# Patient Record
Sex: Female | Born: 1950 | Race: White | Hispanic: No | Marital: Married | Smoking: Never smoker
Health system: Southern US, Community
[De-identification: ages and names within clinical notes are randomized; demographics above are authoritative.]

## PROBLEM LIST (undated history)

## (undated) DIAGNOSIS — E785 Hyperlipidemia, unspecified: Secondary | ICD-10-CM

## (undated) DIAGNOSIS — B351 Tinea unguium: Secondary | ICD-10-CM

## (undated) DIAGNOSIS — I679 Cerebrovascular disease, unspecified: Secondary | ICD-10-CM

## (undated) DIAGNOSIS — G43909 Migraine, unspecified, not intractable, without status migrainosus: Secondary | ICD-10-CM

## (undated) DIAGNOSIS — E669 Obesity, unspecified: Secondary | ICD-10-CM

## (undated) DIAGNOSIS — M1990 Unspecified osteoarthritis, unspecified site: Secondary | ICD-10-CM

## (undated) DIAGNOSIS — E559 Vitamin D deficiency, unspecified: Secondary | ICD-10-CM

## (undated) DIAGNOSIS — I10 Essential (primary) hypertension: Secondary | ICD-10-CM

## (undated) DIAGNOSIS — R519 Headache, unspecified: Secondary | ICD-10-CM

## (undated) DIAGNOSIS — I639 Cerebral infarction, unspecified: Secondary | ICD-10-CM

## (undated) DIAGNOSIS — M8580 Other specified disorders of bone density and structure, unspecified site: Secondary | ICD-10-CM

## (undated) DIAGNOSIS — R490 Dysphonia: Secondary | ICD-10-CM

## (undated) DIAGNOSIS — M5430 Sciatica, unspecified side: Secondary | ICD-10-CM

## (undated) DIAGNOSIS — K589 Irritable bowel syndrome without diarrhea: Secondary | ICD-10-CM

## (undated) DIAGNOSIS — R112 Nausea with vomiting, unspecified: Secondary | ICD-10-CM

## (undated) DIAGNOSIS — I700 Atherosclerosis of aorta: Secondary | ICD-10-CM

## (undated) DIAGNOSIS — R32 Unspecified urinary incontinence: Secondary | ICD-10-CM

## (undated) DIAGNOSIS — M79603 Pain in arm, unspecified: Secondary | ICD-10-CM

## (undated) DIAGNOSIS — J302 Other seasonal allergic rhinitis: Secondary | ICD-10-CM

## (undated) DIAGNOSIS — R1310 Dysphagia, unspecified: Secondary | ICD-10-CM

## (undated) DIAGNOSIS — Z9889 Other specified postprocedural states: Secondary | ICD-10-CM

## (undated) DIAGNOSIS — K219 Gastro-esophageal reflux disease without esophagitis: Secondary | ICD-10-CM

## (undated) DIAGNOSIS — Z973 Presence of spectacles and contact lenses: Secondary | ICD-10-CM

## (undated) DIAGNOSIS — M5481 Occipital neuralgia: Secondary | ICD-10-CM

## (undated) DIAGNOSIS — G459 Transient cerebral ischemic attack, unspecified: Secondary | ICD-10-CM

## (undated) DIAGNOSIS — N898 Other specified noninflammatory disorders of vagina: Secondary | ICD-10-CM

## (undated) DIAGNOSIS — F430 Acute stress reaction: Secondary | ICD-10-CM

## (undated) DIAGNOSIS — R51 Headache: Secondary | ICD-10-CM

## (undated) DIAGNOSIS — K5792 Diverticulitis of intestine, part unspecified, without perforation or abscess without bleeding: Secondary | ICD-10-CM

## (undated) HISTORY — DX: Vitamin D deficiency, unspecified: E55.9

## (undated) HISTORY — DX: Headache, unspecified: R51.9

## (undated) HISTORY — PX: TONSILLECTOMY: SUR1361

## (undated) HISTORY — DX: Dysphonia: R49.0

## (undated) HISTORY — DX: Atherosclerosis of aorta: I70.0

## (undated) HISTORY — DX: Diverticulitis of intestine, part unspecified, without perforation or abscess without bleeding: K57.92

## (undated) HISTORY — PX: OTHER SURGICAL HISTORY: SHX169

## (undated) HISTORY — DX: Acute stress reaction: F43.0

## (undated) HISTORY — DX: Unspecified urinary incontinence: R32

## (undated) HISTORY — DX: Other specified disorders of bone density and structure, unspecified site: M85.80

## (undated) HISTORY — DX: Sciatica, unspecified side: M54.30

## (undated) HISTORY — DX: Other specified noninflammatory disorders of vagina: N89.8

## (undated) HISTORY — DX: Pain in arm, unspecified: M79.603

## (undated) HISTORY — PX: DIAGNOSTIC LAPAROSCOPY: SUR761

## (undated) HISTORY — DX: Migraine, unspecified, not intractable, without status migrainosus: G43.909

## (undated) HISTORY — DX: Tinea unguium: B35.1

## (undated) HISTORY — DX: Dysphagia, unspecified: R13.10

## (undated) HISTORY — PX: CHOLECYSTECTOMY: SHX55

## (undated) HISTORY — PX: COLONOSCOPY: SHX174

## (undated) HISTORY — DX: Cerebrovascular disease, unspecified: I67.9

## (undated) HISTORY — DX: Irritable bowel syndrome, unspecified: K58.9

## (undated) HISTORY — PX: DILATION AND CURETTAGE OF UTERUS: SHX78

## (undated) HISTORY — DX: Obesity, unspecified: E66.9

## (undated) HISTORY — PX: ABDOMINAL HYSTERECTOMY: SHX81

## (undated) HISTORY — PX: LARYNGOSCOPY: SUR817

## (undated) HISTORY — DX: Occipital neuralgia: M54.81

## (undated) HISTORY — DX: Headache: R51

## (undated) HISTORY — DX: Hyperlipidemia, unspecified: E78.5

---

## 1998-07-03 ENCOUNTER — Ambulatory Visit (HOSPITAL_COMMUNITY): Admission: RE | Admit: 1998-07-03 | Discharge: 1998-07-03 | Payer: Self-pay | Admitting: Orthopedic Surgery

## 1999-03-25 ENCOUNTER — Ambulatory Visit (HOSPITAL_COMMUNITY): Admission: RE | Admit: 1999-03-25 | Discharge: 1999-03-26 | Payer: Self-pay | Admitting: *Deleted

## 2000-06-24 ENCOUNTER — Encounter: Admission: RE | Admit: 2000-06-24 | Discharge: 2000-09-22 | Payer: Self-pay | Admitting: Anesthesiology

## 2000-08-02 ENCOUNTER — Emergency Department (HOSPITAL_COMMUNITY): Admission: EM | Admit: 2000-08-02 | Discharge: 2000-08-02 | Payer: Self-pay | Admitting: Emergency Medicine

## 2004-07-29 ENCOUNTER — Encounter: Admission: RE | Admit: 2004-07-29 | Discharge: 2004-09-01 | Payer: Self-pay | Admitting: Family Medicine

## 2005-07-02 ENCOUNTER — Other Ambulatory Visit: Admission: RE | Admit: 2005-07-02 | Discharge: 2005-07-02 | Payer: Self-pay | Admitting: Obstetrics and Gynecology

## 2006-12-03 ENCOUNTER — Encounter: Admission: RE | Admit: 2006-12-03 | Discharge: 2006-12-03 | Payer: Self-pay | Admitting: Family Medicine

## 2007-12-22 ENCOUNTER — Encounter: Admission: RE | Admit: 2007-12-22 | Discharge: 2007-12-22 | Payer: Self-pay | Admitting: Family Medicine

## 2007-12-27 ENCOUNTER — Encounter: Admission: RE | Admit: 2007-12-27 | Discharge: 2007-12-27 | Payer: Self-pay | Admitting: Family Medicine

## 2009-01-16 ENCOUNTER — Encounter: Admission: RE | Admit: 2009-01-16 | Discharge: 2009-01-16 | Payer: Self-pay | Admitting: Family Medicine

## 2009-09-24 ENCOUNTER — Encounter: Admission: RE | Admit: 2009-09-24 | Discharge: 2009-09-24 | Payer: Self-pay | Admitting: Obstetrics and Gynecology

## 2010-12-14 DIAGNOSIS — G459 Transient cerebral ischemic attack, unspecified: Secondary | ICD-10-CM

## 2010-12-14 HISTORY — DX: Transient cerebral ischemic attack, unspecified: G45.9

## 2011-01-04 ENCOUNTER — Encounter: Payer: Self-pay | Admitting: Family Medicine
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
## 2011-07-24 ENCOUNTER — Emergency Department (HOSPITAL_COMMUNITY): Payer: 59

## 2011-07-24 ENCOUNTER — Observation Stay (HOSPITAL_COMMUNITY)
Admission: EM | Admit: 2011-07-24 | Discharge: 2011-07-25 | DRG: 065 | Disposition: A | Discharge status: Home / Self Care | Payer: 59 | Attending: Internal Medicine | Admitting: Internal Medicine

## 2011-07-24 DIAGNOSIS — K219 Gastro-esophageal reflux disease without esophagitis: Secondary | ICD-10-CM | POA: Diagnosis present

## 2011-07-24 DIAGNOSIS — E669 Obesity, unspecified: Secondary | ICD-10-CM | POA: Diagnosis present

## 2011-07-24 DIAGNOSIS — M531 Cervicobrachial syndrome: Secondary | ICD-10-CM | POA: Insufficient documentation

## 2011-07-24 DIAGNOSIS — K5730 Diverticulosis of large intestine without perforation or abscess without bleeding: Secondary | ICD-10-CM | POA: Insufficient documentation

## 2011-07-24 DIAGNOSIS — R209 Unspecified disturbances of skin sensation: Principal | ICD-10-CM | POA: Insufficient documentation

## 2011-07-24 DIAGNOSIS — Z79899 Other long term (current) drug therapy: Secondary | ICD-10-CM | POA: Insufficient documentation

## 2011-07-24 DIAGNOSIS — I672 Cerebral atherosclerosis: Secondary | ICD-10-CM | POA: Diagnosis present

## 2011-07-24 DIAGNOSIS — I10 Essential (primary) hypertension: Secondary | ICD-10-CM | POA: Diagnosis present

## 2011-07-24 DIAGNOSIS — E785 Hyperlipidemia, unspecified: Secondary | ICD-10-CM | POA: Diagnosis present

## 2011-07-24 LAB — DIFFERENTIAL
Eosinophils Absolute: 0.1 10*3/uL (ref 0.0–0.7)
Lymphs Abs: 2.6 10*3/uL (ref 0.7–4.0)
Monocytes Relative: 4 % (ref 3–12)
Neutrophils Relative %: 60 % (ref 43–77)

## 2011-07-24 LAB — POCT I-STAT, CHEM 8
BUN: 18 mg/dL (ref 6–23)
Calcium, Ion: 1.22 mmol/L (ref 1.12–1.32)
Chloride: 110 mEq/L (ref 96–112)
Creatinine, Ser: 0.9 mg/dL (ref 0.50–1.10)
Glucose, Bld: 88 mg/dL (ref 70–99)

## 2011-07-24 LAB — CBC
MCH: 31.4 pg (ref 26.0–34.0)
MCV: 88.3 fL (ref 78.0–100.0)
Platelets: 200 10*3/uL (ref 150–400)
RBC: 4.43 MIL/uL (ref 3.87–5.11)

## 2011-07-25 ENCOUNTER — Inpatient Hospital Stay (HOSPITAL_COMMUNITY): Payer: 59

## 2011-07-25 LAB — URINALYSIS, ROUTINE W REFLEX MICROSCOPIC
Nitrite: NEGATIVE
Protein, ur: NEGATIVE mg/dL
Urobilinogen, UA: 0.2 mg/dL (ref 0.0–1.0)

## 2011-07-25 LAB — COMPREHENSIVE METABOLIC PANEL
ALT: 17 U/L (ref 0–35)
CO2: 24 mEq/L (ref 19–32)
Calcium: 9 mg/dL (ref 8.4–10.5)
Creatinine, Ser: 0.83 mg/dL (ref 0.50–1.10)
GFR calc Af Amer: >60 mL/min (ref >60)
GFR calc non Af Amer: >60 mL/min (ref >60)
Glucose, Bld: 93 mg/dL (ref 70–99)
Total Bilirubin: 0.6 mg/dL (ref 0.3–1.2)

## 2011-07-25 LAB — CBC
MCH: 31 pg (ref 26.0–34.0)
MCV: 89.4 fL (ref 78.0–100.0)
Platelets: 190 10*3/uL (ref 150–400)
RDW: 13 % (ref 11.5–15.5)

## 2011-07-25 LAB — HEMOGLOBIN A1C: Hgb A1c MFr Bld: 6 % — ABNORMAL HIGH (ref <5.7)

## 2011-07-25 LAB — LIPID PANEL
HDL: 34 mg/dL — ABNORMAL LOW (ref >39)
Triglycerides: 211 mg/dL — ABNORMAL HIGH (ref <150)

## 2011-07-25 LAB — CARDIAC PANEL(CRET KIN+CKTOT+MB+TROPI)
Relative Index: 2.4 (ref 0.0–2.5)
Total CK: 126 U/L (ref 7–177)
Troponin I: <0.3 ng/mL (ref <0.30)

## 2011-07-25 LAB — URINE MICROSCOPIC-ADD ON

## 2011-07-25 LAB — RAPID URINE DRUG SCREEN, HOSP PERFORMED
Amphetamines: NOT DETECTED
Tetrahydrocannabinol: NOT DETECTED

## 2011-07-25 MED ORDER — IOHEXOL 350 MG/ML SOLN
50.0000 mL | Freq: Once | INTRAVENOUS | Status: AC | PRN
  Administered 2011-07-25: 50 mL via INTRAVENOUS

## 2011-07-28 NOTE — Discharge Summary (Signed)
**Alexis Frye De-Identified via Obfuscation**   NAMESHAQUINA, Alexis Frye               ACCOUNT NO.:  [id number]  MEDICAL RECORD NO.:  [id number]  LOCATION:  3008                         FACILITY:  [facility]  PHYSICIAN:  Lonia Blood, M.D.       DATE OF BIRTH:  October 06, 1951  DATE OF ADMISSION:  07/24/2011 DATE OF DISCHARGE:  07/25/2011                              DISCHARGE SUMMARY   PRIMARY CARE PHYSICIAN:  Dr. Daisy Floro at [facility].  DISCHARGE DIAGNOSES: 1. Right-sided body numbness, question lacunar stroke versus transient     ischemic attack. 2. 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 3. Hypertension. 4. Hyperlipidemia. 5. Status post cholecystectomy. 6. Status post hysterectomy. 7. History of occipital neuralgia. 8. History of sciatica.  DISCHARGE MEDICATIONS: 1. Aspirin 325 mg daily. 2. Cymbalta 60 mg daily. 3. Lipitor 20 mg daily. 4. Multivitamin a tablet daily. 5. Prevacid 15 mg daily. 6. Topamax 25 mg 3 tablets daily.  CONDITION ON DISCHARGE:  Alexis Alexis Frye Alexis Frye was discharged in good condition, neurologically intact.  Temperature 98.4 heart rate 72, respirations 16, blood pressure 136/83, saturation 94% on room air.  She will follow up with Dr. Delia Heady from Neurology.  She will also follow up with her primary care physician as needed.  PROCEDURE THIS ADMISSION: 1. Alexis Alexis Frye Alexis Frye underwent CT angiogram of Alexis Alexis Frye neck which was negative     for carotid stenosis. 2. Carotid ultrasound negative for carotid stenosis. 3. CT angiogram of Alexis Alexis Frye head with findings of mild intracranial     atherosclerosis with moderate stenosis of Alexis Alexis Frye left ACA, A4 segment,     preserved distal flow.  No signs of acute stroke.  CONSULTATION THIS ADMISSION:  Alexis Alexis Frye Alexis Frye was seen in consultation by Dr. Pearlean Brownie from Neurology.  HOSPITAL COURSE:  Alexis Alexis Frye Alexis Frye is a 60 year old woman with history of hypertension and hyperlipidemia presented to emergency room with sudden onset of right-sided hemianesthesia of Alexis Alexis Frye body.   She was placed in Alexis Alexis Frye hospital for observation for 24 hours.  She had frequent neurological checks without any problems being found.  She had improvement in her symptoms but decreased paresthesia on Alexis Alexis Frye right side of Alexis Alexis Frye body.  We hypothesized that her symptoms were probably due to small lacunar stroke in Alexis Alexis Frye left thalamic region.  Also other possibilities are TIA like problem.  In any case, she improved nicely during this admission. A CT angiogram of Alexis Alexis Frye head and neck did not reveal any major intracranial stenosis.  MRI of Alexis Alexis Frye brain could not be done due to implantable pain stimulator.  Alexis Alexis Frye Alexis Frye was advised to start taking aspirin on a daily basis and she will follow up with Dr. Pearlean Brownie as an outpatient.     Lonia Blood, M.D.     SL/MEDQ  D:  07/25/2011  T:  07/25/2011  Job:  [id number]  cc:   Magnus Sinning) Tenny Craw, M.D. Pramod P. Pearlean Brownie, MD  Electronically Signed by Lonia Blood M.D. on 07/28/2011 05:46:20 PM

## 2011-08-12 NOTE — Consult Note (Signed)
NAMESINTIA, MCKISSIC               ACCOUNT NO.:  [id number]  MEDICAL RECORD NO.:  [id number]  LOCATION:  3008                         FACILITY:  [facility]  PHYSICIAN:  Felise Georgia P. Pearlean Brownie, MD    DATE OF BIRTH:  [birth year]  DATE OF CONSULTATION: DATE OF DISCHARGE:                                CONSULTATION   REFERRING PHYSICIAN:  Lorre Nick, MD  REASON FOR REFERRAL:  Stroke.  HISTORY OF PRESENT ILLNESS:  Ms. Mayol is a 60 year old Caucasian lady who woke up this morning with right-sided numbness.  She describes this affecting the right half of the face as well as arm and leg.  She describes this as a tingling and extra sensation rather than the lack of sensation.  She had some trouble holding a coffee cup and she felt that she did not feel any weight when she moved it, she did not actually drop with.  She denies any weakness, loss of vision, double vision, vertigo. She did notice that her balance was slightly off and she took one or two-step off but was able to steady herself and did not fall.  She denied any prior history of stroke, TIA, seizures.  She does have history of chronic occipital neuralgia for which she has a pain stimulator implant and since she sees pain doctor at [facility].  She also has mild chronic headaches which has seem to be well controlled with Topamax.  PAST MEDICAL HISTORY:  Significant for hypertension, hyperlipidemia, diverticulosis, headaches, occipital neuralgia, and sciatica.  HOME MEDICATIONS:  Cymbalta, fish oil, Lipitor, Lotemax, multivitamin, Prevacid, Topamax, Tylenol.  MEDICATION ALLERGIES:  None listed.  PAST SURGICAL HISTORY:  Occipital neuralgia pain stimulator implant.  REVIEW OF SYSTEMS:  Negative for recent fever, cough, chest pain, diarrhea, or illness.  PHYSICAL EXAMINATION:  GENERAL:  Reveals pleasant middle-aged Caucasian lady. VITAL SIGNS:  She is febrile.  Pulse rate is 72 per minute and regular, respiratory rate 16 per minute, oxygen  sats 99%, blood pressure 146/79. Distal pulses are well felt. HEAD:  Nontraumatic. NECK:  Supple.  There is no bruit. EARS:  Hearing is normal. CARDIAC:  No murmur or gallop. LUNGS:  Clear to auscultation. ABDOMEN:  Soft, nontender. NEUROLOGIC:  She is awake, alert, oriented to time, place, and person. Speech and language appear normal.  She appears slightly anxious.  Eye movements are full range without nystagmus.  Visual acuity and field seem adequate.  Face is symmetric.  Palatal movements are normal. Tongue is midline. MOTOR SYSTEM:  Reveals no upper or lower extremity drift, symmetric, and equal strength in all four extremities.  No focal weakness.  She has no objective sensory loss to touch or pinprick, but has subjective paresthesias and tingling sensation on the right hemibody including the forehead.  Gait was not tested.  DATA REVIEWED:  Noncontrast CAT scan of the head shows no acute abnormality.  Basic metabolic panel labs and CBC are normal.  IMPRESSION:  A 59 year old lady with sudden onset of right hemibody paresthesias this morning, possibly from small left thalamic lacunar infarct.  PLAN:  She will be admitted for further stroke workup.  MRI cannot be done because of pain stimulator implant.  Check  CT angiogram of the brain and neck in the morning.  Check echocardiogram, Dopplers, lipid profile, hemoglobin A1c.  Start aspirin 325 mg a day.  I had long discussion with the patient's husband and daughter regarding her symptoms, plan for evaluation treatment, and answered questions.  Stroke Team will follow.     Keean Wilmeth P. Pearlean Brownie, MD     PPS/MEDQ  D:  07/24/2011  T:  07/25/2011  Job:  [id number]  Electronically Signed by Delia Heady MD on 08/12/2011 08:33:00 PM

## 2011-08-13 ENCOUNTER — Ambulatory Visit: Payer: 59 | Attending: Family Medicine | Admitting: Physical Therapy

## 2011-08-13 DIAGNOSIS — M79609 Pain in unspecified limb: Secondary | ICD-10-CM | POA: Insufficient documentation

## 2011-08-13 DIAGNOSIS — IMO0001 Reserved for inherently not codable concepts without codable children: Secondary | ICD-10-CM | POA: Insufficient documentation

## 2011-08-13 DIAGNOSIS — M2569 Stiffness of other specified joint, not elsewhere classified: Secondary | ICD-10-CM | POA: Insufficient documentation

## 2011-08-20 ENCOUNTER — Ambulatory Visit: Payer: 59 | Attending: Family Medicine | Admitting: Physical Therapy

## 2011-08-20 DIAGNOSIS — M2569 Stiffness of other specified joint, not elsewhere classified: Secondary | ICD-10-CM | POA: Insufficient documentation

## 2011-08-20 DIAGNOSIS — M79609 Pain in unspecified limb: Secondary | ICD-10-CM | POA: Insufficient documentation

## 2011-08-20 DIAGNOSIS — IMO0001 Reserved for inherently not codable concepts without codable children: Secondary | ICD-10-CM | POA: Insufficient documentation

## 2011-08-25 ENCOUNTER — Encounter: Payer: 59 | Admitting: Physical Therapy

## 2011-08-27 ENCOUNTER — Ambulatory Visit: Payer: 59 | Admitting: Physical Therapy

## 2011-09-01 ENCOUNTER — Ambulatory Visit: Payer: 59 | Admitting: Physical Therapy

## 2011-09-03 ENCOUNTER — Ambulatory Visit: Payer: 59 | Admitting: Physical Therapy

## 2011-09-26 NOTE — H&P (Signed)
Alexis Frye, Alexis Frye               ACCOUNT NO.:  [id number]  MEDICAL RECORD NO.:  [id number]  LOCATION:  3008                         FACILITY:  [facility]  PHYSICIAN:  Lonia Blood, M.D.      DATE OF BIRTH:  [birth year]  DATE OF ADMISSION:  07/24/2011 DATE OF DISCHARGE:                             HISTORY & PHYSICAL   PRIMARY CARE PHYSICIAN:  Media planner at [facility].  PRESENTING COMPLAINT:  Right-sided numbness, tingling, and weakness.  HISTORY OF PRESENT ILLNESS:  The patient is a 60 year old female with history of hypertension, hyperlipidemia, and migraine headaches who presented with numbness and tingling on her right side.  This happened today, she woke up in the middle of the night with the symptoms.  This persisted for a few minutes.  It is already gone by the time she go to the ED.  She has some tingling also.  There was problem with balance mildly initially.  She denied any loss of consciousness, no diplopia, no vertigo, no focal weakness.  She had no problem with acute headaches, no nausea, no neck pain, no vomiting.  Symptoms are currently gone by the time she got to the ED.  Her past medical history is significant for persistent headache, history of occipital neuralgia, status post pain stimulator insertion, hyperlipidemia, irregular heart rate, history of sciatica, hypertension.  PAST SURGICAL HISTORY:  Status post cholecystectomy, status post hysterectomy, status post laparoscopy, status post tonsillectomy, status for laryngectomy, start post insertion of neurostimulator x3.  ALLERGIES:  No known drug allergies.  MEDICATIONS:  Currently on, Cymbalta, fish oil, Lipitor, Lotemax ophthalmic drug, multivitamins, Prevacid, Topamax, and Tylenol.  SOCIAL HISTORY:  She is married, lives with her husband.  Denied tobacco, alcohol, or IV drug use.  FAMILY HISTORY:  Denied any family history of thromboembolic phenomena or CVA.  REVIEW OF SYSTEMS:  All  systems reviewed and are negative except per HPI.  PHYSICAL EXAMINATION:  VITAL SIGNS:  Temperature 97.7, blood pressure 138/80 with a pulse 69, respiratory rate 16, O2 sats 95% on room air. GENERAL:  She is awake, alert, and oriented in no acute distress. HEENT:  PERRL.  EOMI.  No pallor, no jaundice, no rhinorrhea. NECK:  Supple.  No visible JVD.  No lymphadenopathy. RESPIRATORY:  Shows good air entry bilaterally.  No wheezes, no rales, no crackles. CARDIOVASCULAR SYSTEM:  She has S1, S2.  No murmur. ABDOMEN:  Soft, full, nontender with positive bowel sounds. EXTREMITIES:  No edema, cyanosis, or clubbing. SKIN:  No rashes.  No ulcers.  Her labs, urine drug screen is negative.  White count is 7.7, hemoglobin 13.9 with platelet of 200.  Sodium 142, potassium 3.7, chloride 110, glucose 88, BUN 18, creatinine 0.90.  Head CT without contrast showed no acute abnormality.  There is some left sphenoid sinus disease.  EKG showed normal sinus rhythm with no significant ST-T wave changes.  ASSESSMENT:  This is a 60 year old female presenting with right-sided transient ischemic attack, hemibody tingling and numbness.  These have resolved now, but patient has risk factors for cerebrovascular accident. She is hence being admitted for workup of transient ischemic attack, cerebrovascular accident.  The patient has a neurostimulator in  place, so she cannot get an MRI.  PLAN: 1. Transient ischemic attack.  Admit the patient to neuro floor.  She     has already passed swallow evaluation.  Her symptoms have resolved     and actually came in outside the windows for t-PA anyways.  We will     get CT angiogram in the morning, get carotid Dopplers, 2-D echo.     If any of those is positive, we will do further workup including     thiamine, B12 levels.  Neurology is already involved and Dr. Pearlean Brownie     will follow up with the patient.  She will be on aspirin for now     and further treatment we will  follow especially after her CT     angiogram. 2. Hyperlipidemia.  We will continue with Lipitor and fish oil. 3. Hypertension.  Blood pressure is reasonable at this point.     Continue with home medications. 4. GERD.  I will put on PPI. 5. Obesity.  The patient seems stable and may need good nutritional     consult.  Further treatment will depend on patient's response to     these initial measures.     Lonia Blood, M.D.     Verlin Grills  D:  07/25/2011  T:  07/25/2011  Job:  [id number]  Electronically Signed by Lonia Blood M.D. on 09/26/2011 02:51:00 PM

## 2013-07-24 ENCOUNTER — Other Ambulatory Visit: Payer: Self-pay | Admitting: Neurosurgery

## 2013-08-29 NOTE — Pre-Procedure Instructions (Signed)
Alexis Frye  08/29/2013   Your procedure is scheduled on: Thursday, September 25th.  Report to [facility], Main Entrance [name] "A"at 5:30AM.  Call this number if you have problems the morning of surgery: [phone]   Remember:   Do not eat food or drink liquids after midnight.   Take these medicines the morning of surgery with A SIP OF WATER: -   Do not wear jewelry, make-up or nail polish.  Do not wear lotions, powders, or perfumes. You may wear deodorant.  Do not shave 48 hours prior to surgery.   Do not bring valuables to the hospital.  [facility] is not responsible  for any belongings or valuables.  Contacts, dentures or bridgework may not be worn into surgery.  Leave suitcase in the car. After surgery it may be brought to your room.  For patients admitted to the hospital, checkout time is 11:00 AM the day of discharge.   Patients discharged the day of surgery will not be allowed to drive home.  Name and phone number of your driver: -   Special Instructions:    Please read over the following fact sheets that you were given: Pain Booklet, Coughing and Deep Breathing and Surgical Site Infection Prevention

## 2013-08-30 ENCOUNTER — Other Ambulatory Visit (HOSPITAL_COMMUNITY): Payer: 59

## 2013-09-07 ENCOUNTER — Encounter (HOSPITAL_COMMUNITY): Admission: RE | Payer: Self-pay | Source: Ambulatory Visit

## 2013-09-07 ENCOUNTER — Ambulatory Visit (HOSPITAL_COMMUNITY): Admission: RE | Admit: 2013-09-07 | Payer: 59 | Source: Ambulatory Visit | Admitting: Neurosurgery

## 2013-09-07 SURGERY — SUBTHALAMIC STIMULATOR BATTERY REPLACEMENT
Anesthesia: General

## 2014-11-01 ENCOUNTER — Other Ambulatory Visit: Payer: Self-pay | Admitting: Orthopedic Surgery

## 2014-11-21 ENCOUNTER — Encounter (HOSPITAL_BASED_OUTPATIENT_CLINIC_OR_DEPARTMENT_OTHER): Payer: Self-pay | Admitting: *Deleted

## 2014-11-21 NOTE — Progress Notes (Signed)
No labs needed

## 2014-11-22 NOTE — H&P (Signed)
Alexis HollerVanessa J Frye is an 63 y.o. female.   Chief Complaint: Left Shoulder Pain  HPI: Patient presents with a chief complaint of left shoulder pain.  Patient states that she's noticed increased pain over the last 2 months.  Over the last 3 weeks she's noticed increasing pain.  She recalls no injury and states her symptoms have gradually become worse.  She has noticed decreased range of motion over this time.  Today, she states the pain is constant and aching.  She does have some sharp pains that are moderate in severity and intermittent.  Today, she denies any numbness or tingling.  She denies any swelling.  Her symptoms are getting worse and wake her from sleep.  It is worse with activity and work and better with Motrin.  She denies any prior history of injury.  She denies any current fevers chills night sweats or other signs of infection.  Patient reports continued pain in her left shoulder, especially with overhead activities, it wakes her at night, and does interfere with chores.  An MRI scan has been accomplished showing rotator cuff tendinitis of the supraspinatus and infraspinatus with a fissure in the supraspinatus that is not full-thickness.  In addition reading the MRI scan it does look like she has subacromial spur at the outlet that is pinching the rotator cuff.  Past Medical History  Diagnosis Date  . GERD (gastroesophageal reflux disease)   . Hyperlipemia   . Seasonal allergies   . Wears glasses   . Arthritis   . TIA (transient ischemic attack) 2012    no residual  . PONV (postoperative nausea and vomiting)     Past Surgical History  Procedure Laterality Date  . Abdominal hysterectomy    . Dilation and curettage of uterus    . Cesarean section      x2  . Laryngoscopy      vocal cord polyps  . Diagnostic laparoscopy    . Tonsillectomy    . Cholecystectomy    . Colonoscopy      History reviewed. No pertinent family history. Social History:  reports that she has never smoked.  She does not have any smokeless tobacco history on file. She reports that she drinks alcohol. She reports that she does not use illicit drugs.  Allergies: No Known Allergies  No prescriptions prior to admission    No results found for this or any previous visit (from the past 48 hour(s)). No results found.  Review of Systems  Constitutional: Negative.   HENT: Positive for tinnitus.   Eyes: Negative.   Respiratory: Negative.   Cardiovascular: Negative.        Hx of TIA  Gastrointestinal: Negative.   Genitourinary: Negative.   Musculoskeletal: Positive for joint pain.  Skin: Negative.   Neurological: Positive for headaches.  Endo/Heme/Allergies: Negative.   Psychiatric/Behavioral: Negative.     Height 5\' 3"  (1.6 m), weight 84.823 kg (187 lb). Physical Exam  Constitutional: She is oriented to person, place, and time. She appears well-developed and well-nourished.  HENT:  Head: Normocephalic and atraumatic.  Eyes: Pupils are equal, round, and reactive to light.  Neck: Normal range of motion. Neck supple.  Cardiovascular: Intact distal pulses.   Respiratory: Effort normal.  Musculoskeletal:  Decreased motion of the left shoulder, flexion impingement is negative, flexion internal rotation impingement is 3-4+.  She remains neurovascular intact.  Neurological: She is alert and oriented to person, place, and time.  Skin: Skin is warm and dry.  Psychiatric: She  has a normal mood and affect. Her behavior is normal. Judgment and thought content normal.     Assessment/Plan Assess: Left shoulder impingement syndrome with partial-thickness rotator cuff tear.  The supraspinatus  Plan: Wrist benefits of arthroscopic decompression and debridement of partial thickness rotator cuff tear were discussed with the patient.  We'll also be set up for rotator cuff repair, although I don't think she will need it.  Based on the MRI scan.  She had very temporary relief from a cortisone and anesthetic  injection and hopefully surgery we'll give her long-term relief.  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  PHILLIPS, ERIC R 11/22/2014, 5:00 PM

## 2014-11-26 ENCOUNTER — Ambulatory Visit (HOSPITAL_BASED_OUTPATIENT_CLINIC_OR_DEPARTMENT_OTHER): Payer: 59 | Admitting: Certified Registered"

## 2014-11-26 ENCOUNTER — Ambulatory Visit (HOSPITAL_BASED_OUTPATIENT_CLINIC_OR_DEPARTMENT_OTHER)
Admission: RE | Admit: 2014-11-26 | Discharge: 2014-11-26 | Disposition: A | Discharge status: Home / Self Care | Payer: 59 | Source: Ambulatory Visit | Attending: Orthopedic Surgery | Admitting: Orthopedic Surgery

## 2014-11-26 ENCOUNTER — Encounter (HOSPITAL_BASED_OUTPATIENT_CLINIC_OR_DEPARTMENT_OTHER)
Admission: RE | Disposition: A | Discharge status: Home / Self Care | Payer: Self-pay | Source: Ambulatory Visit | Attending: Orthopedic Surgery

## 2014-11-26 ENCOUNTER — Encounter (HOSPITAL_BASED_OUTPATIENT_CLINIC_OR_DEPARTMENT_OTHER): Payer: Self-pay | Admitting: *Deleted

## 2014-11-26 DIAGNOSIS — R51 Headache: Secondary | ICD-10-CM | POA: Insufficient documentation

## 2014-11-26 DIAGNOSIS — E785 Hyperlipidemia, unspecified: Secondary | ICD-10-CM | POA: Insufficient documentation

## 2014-11-26 DIAGNOSIS — H9319 Tinnitus, unspecified ear: Secondary | ICD-10-CM | POA: Insufficient documentation

## 2014-11-26 DIAGNOSIS — J302 Other seasonal allergic rhinitis: Secondary | ICD-10-CM | POA: Insufficient documentation

## 2014-11-26 DIAGNOSIS — M7542 Impingement syndrome of left shoulder: Secondary | ICD-10-CM | POA: Diagnosis present

## 2014-11-26 DIAGNOSIS — K219 Gastro-esophageal reflux disease without esophagitis: Secondary | ICD-10-CM | POA: Insufficient documentation

## 2014-11-26 DIAGNOSIS — M19012 Primary osteoarthritis, left shoulder: Secondary | ICD-10-CM | POA: Diagnosis not present

## 2014-11-26 DIAGNOSIS — Z8673 Personal history of transient ischemic attack (TIA), and cerebral infarction without residual deficits: Secondary | ICD-10-CM | POA: Insufficient documentation

## 2014-11-26 HISTORY — DX: Nausea with vomiting, unspecified: Z98.890

## 2014-11-26 HISTORY — DX: Unspecified osteoarthritis, unspecified site: M19.90

## 2014-11-26 HISTORY — DX: Nausea with vomiting, unspecified: R11.2

## 2014-11-26 HISTORY — DX: Other seasonal allergic rhinitis: J30.2

## 2014-11-26 HISTORY — DX: Gastro-esophageal reflux disease without esophagitis: K21.9

## 2014-11-26 HISTORY — PX: SHOULDER ARTHROSCOPY WITH ROTATOR CUFF REPAIR: SHX5685

## 2014-11-26 HISTORY — DX: Presence of spectacles and contact lenses: Z97.3

## 2014-11-26 HISTORY — DX: Hyperlipidemia, unspecified: E78.5

## 2014-11-26 HISTORY — DX: Transient cerebral ischemic attack, unspecified: G45.9

## 2014-11-26 LAB — POCT HEMOGLOBIN-HEMACUE: Hemoglobin: 14.6 g/dL (ref 12.0–15.0)

## 2014-11-26 SURGERY — ARTHROSCOPY, SHOULDER, WITH ROTATOR CUFF REPAIR
Anesthesia: General | Laterality: Left

## 2014-11-26 MED ORDER — HYDROMORPHONE HCL 1 MG/ML IJ SOLN: 0.2500 mg | INTRAMUSCULAR | Status: DC | PRN

## 2014-11-26 MED ORDER — FENTANYL CITRATE 0.05 MG/ML IJ SOLN
INTRAMUSCULAR | Status: AC
  Filled 2014-11-26: qty 2

## 2014-11-26 MED ORDER — CHLORHEXIDINE GLUCONATE 4 % EX LIQD: 60.0000 mL | Freq: Once | CUTANEOUS | Status: DC

## 2014-11-26 MED ORDER — ONDANSETRON HCL 4 MG/2ML IJ SOLN
INTRAMUSCULAR | Status: DC | PRN
  Administered 2014-11-26: 4 mg via INTRAVENOUS

## 2014-11-26 MED ORDER — FENTANYL CITRATE 0.05 MG/ML IJ SOLN
INTRAMUSCULAR | Status: DC | PRN
  Administered 2014-11-26: 50 ug via INTRAVENOUS

## 2014-11-26 MED ORDER — PROPOFOL 10 MG/ML IV BOLUS
INTRAVENOUS | Status: DC | PRN
  Administered 2014-11-26: 50 mg via INTRAVENOUS
  Administered 2014-11-26: 150 mg via INTRAVENOUS
  Administered 2014-11-26: 100 mg via INTRAVENOUS

## 2014-11-26 MED ORDER — OXYCODONE HCL 5 MG PO TABS: 5.0000 mg | ORAL_TABLET | Freq: Once | ORAL | Status: DC | PRN

## 2014-11-26 MED ORDER — ONDANSETRON HCL 4 MG/2ML IJ SOLN: 4.0000 mg | Freq: Four times a day (QID) | INTRAMUSCULAR | Status: DC | PRN

## 2014-11-26 MED ORDER — FENTANYL CITRATE 0.05 MG/ML IJ SOLN
50.0000 ug | INTRAMUSCULAR | Status: DC | PRN
  Administered 2014-11-26: 100 ug via INTRAVENOUS

## 2014-11-26 MED ORDER — DEXTROSE-NACL 5-0.45 % IV SOLN: INTRAVENOUS | Status: DC

## 2014-11-26 MED ORDER — EPINEPHRINE HCL 1 MG/ML IJ SOLN
INTRAMUSCULAR | Status: DC | PRN
  Administered 2014-11-26: 1 mg

## 2014-11-26 MED ORDER — BUPIVACAINE-EPINEPHRINE (PF) 0.5% -1:200000 IJ SOLN
INTRAMUSCULAR | Status: DC | PRN
  Administered 2014-11-26: 30 mL via PERINEURAL

## 2014-11-26 MED ORDER — METOCLOPRAMIDE HCL 5 MG PO TABS: 5.0000 mg | ORAL_TABLET | Freq: Three times a day (TID) | ORAL | Status: DC | PRN

## 2014-11-26 MED ORDER — HYDROCODONE-ACETAMINOPHEN 5-325 MG PO TABS: 1.0000 | ORAL_TABLET | Freq: Four times a day (QID) | ORAL | Status: DC | PRN

## 2014-11-26 MED ORDER — CEFAZOLIN SODIUM-DEXTROSE 2-3 GM-% IV SOLR
INTRAVENOUS | Status: AC
  Filled 2014-11-26: qty 50

## 2014-11-26 MED ORDER — DEXAMETHASONE SODIUM PHOSPHATE 4 MG/ML IJ SOLN
INTRAMUSCULAR | Status: DC | PRN
  Administered 2014-11-26: 10 mg via INTRAVENOUS

## 2014-11-26 MED ORDER — CEFAZOLIN SODIUM-DEXTROSE 2-3 GM-% IV SOLR
2.0000 g | INTRAVENOUS | Status: AC
  Administered 2014-11-26: 2 g via INTRAVENOUS

## 2014-11-26 MED ORDER — PROMETHAZINE HCL 25 MG/ML IJ SOLN: 6.2500 mg | INTRAMUSCULAR | Status: DC | PRN

## 2014-11-26 MED ORDER — ONDANSETRON HCL 4 MG PO TABS: 4.0000 mg | ORAL_TABLET | Freq: Four times a day (QID) | ORAL | Status: DC | PRN

## 2014-11-26 MED ORDER — MIDAZOLAM HCL 2 MG/2ML IJ SOLN
1.0000 mg | INTRAMUSCULAR | Status: DC | PRN
  Administered 2014-11-26: 2 mg via INTRAVENOUS

## 2014-11-26 MED ORDER — OXYCODONE HCL 5 MG/5ML PO SOLN: 5.0000 mg | Freq: Once | ORAL | Status: DC | PRN

## 2014-11-26 MED ORDER — SCOPOLAMINE 1 MG/3DAYS TD PT72
1.0000 | MEDICATED_PATCH | TRANSDERMAL | Status: DC
  Administered 2014-11-26: 1.5 mg via TRANSDERMAL

## 2014-11-26 MED ORDER — EPINEPHRINE HCL 1 MG/ML IJ SOLN
INTRAMUSCULAR | Status: AC
  Filled 2014-11-26: qty 1

## 2014-11-26 MED ORDER — MIDAZOLAM HCL 2 MG/2ML IJ SOLN
INTRAMUSCULAR | Status: AC
  Filled 2014-11-26: qty 2

## 2014-11-26 MED ORDER — FENTANYL CITRATE 0.05 MG/ML IJ SOLN
INTRAMUSCULAR | Status: AC
  Filled 2014-11-26: qty 6

## 2014-11-26 MED ORDER — SODIUM CHLORIDE 0.9 % IR SOLN
Status: DC | PRN
  Administered 2014-11-26 (×2): 3000 mL

## 2014-11-26 MED ORDER — LACTATED RINGERS IV SOLN
INTRAVENOUS | Status: DC
  Administered 2014-11-26 (×2): via INTRAVENOUS

## 2014-11-26 MED ORDER — METOCLOPRAMIDE HCL 5 MG/ML IJ SOLN: 5.0000 mg | Freq: Three times a day (TID) | INTRAMUSCULAR | Status: DC | PRN

## 2014-11-26 MED ORDER — SUCCINYLCHOLINE CHLORIDE 20 MG/ML IJ SOLN
INTRAMUSCULAR | Status: DC | PRN
  Administered 2014-11-26: 100 mg via INTRAVENOUS

## 2014-11-26 SURGICAL SUPPLY — 59 items
BLADE CUTTER GATOR 3.5 (BLADE) ×1 IMPLANT
BLADE GREAT WHITE 4.2 (BLADE) ×2 IMPLANT
BLADE SURG 15 STRL LF DISP TIS (BLADE) IMPLANT
BLADE SURG 15 STRL SS (BLADE)
BUR VERTEX HOODED 4.5 (BURR) ×2 IMPLANT
CANISTER SUCT 3000ML (MISCELLANEOUS) IMPLANT
CANNULA 5.75X71 LONG (CANNULA) IMPLANT
CANNULA TWIST IN 8.25X7CM (CANNULA) IMPLANT
DECANTER SPIKE VIAL GLASS SM (MISCELLANEOUS) IMPLANT
DRAPE INCISE IOBAN 66X45 STRL (DRAPES) IMPLANT
DRAPE SHOULDER BEACH CHAIR (DRAPES) ×2 IMPLANT
DRSG PAD ABDOMINAL 8X10 ST (GAUZE/BANDAGES/DRESSINGS) ×2 IMPLANT
DURAPREP 26ML APPLICATOR (WOUND CARE) ×2 IMPLANT
ELECT REM PT RETURN 9FT ADLT (ELECTROSURGICAL) ×2
ELECTRODE REM PT RTRN 9FT ADLT (ELECTROSURGICAL) ×1 IMPLANT
GAUZE SPONGE 4X4 12PLY STRL (GAUZE/BANDAGES/DRESSINGS) ×2 IMPLANT
GAUZE XEROFORM 1X8 LF (GAUZE/BANDAGES/DRESSINGS) ×2 IMPLANT
GLOVE BIO SURGEON STRL SZ7.5 (GLOVE) ×2 IMPLANT
GLOVE BIO SURGEON STRL SZ8.5 (GLOVE) ×2 IMPLANT
GLOVE BIOGEL PI IND STRL 8 (GLOVE) ×1 IMPLANT
GLOVE BIOGEL PI IND STRL 9 (GLOVE) ×1 IMPLANT
GLOVE BIOGEL PI INDICATOR 8 (GLOVE) ×1
GLOVE BIOGEL PI INDICATOR 9 (GLOVE) ×1
GOWN STRL REUS W/ TWL LRG LVL3 (GOWN DISPOSABLE) ×1 IMPLANT
GOWN STRL REUS W/ TWL LRG LVL4 (GOWN DISPOSABLE) ×1 IMPLANT
GOWN STRL REUS W/TWL LRG LVL3 (GOWN DISPOSABLE) ×2
GOWN STRL REUS W/TWL LRG LVL4 (GOWN DISPOSABLE) ×2
GOWN STRL REUS W/TWL XL LVL4 (GOWN DISPOSABLE) ×2 IMPLANT
IV NS IRRIG 3000ML ARTHROMATIC (IV SOLUTION) ×2 IMPLANT
MANIFOLD NEPTUNE II (INSTRUMENTS) ×2 IMPLANT
NDL SAFETY ECLIPSE 18X1.5 (NEEDLE) ×1 IMPLANT
NDL SUT 6 .5 CRC .975X.05 MAYO (NEEDLE) IMPLANT
NEEDLE HYPO 18GX1.5 SHARP (NEEDLE) ×2
NEEDLE MAYO TAPER (NEEDLE)
NS IRRIG 1000ML POUR BTL (IV SOLUTION) IMPLANT
PACK ARTHROSCOPY DSU (CUSTOM PROCEDURE TRAY) ×2 IMPLANT
PACK BASIN DAY SURGERY FS (CUSTOM PROCEDURE TRAY) ×2 IMPLANT
PENCIL BUTTON HOLSTER BLD 10FT (ELECTRODE) IMPLANT
SET IRRIG Y TYPE TUR BLADDER L (SET/KITS/TRAYS/PACK) ×2 IMPLANT
SLEEVE SCD COMPRESS KNEE MED (MISCELLANEOUS) IMPLANT
SLING ARM IMMOBILIZER LRG (SOFTGOODS) IMPLANT
SLING ARM LRG ADULT FOAM STRAP (SOFTGOODS) ×1 IMPLANT
SLING ARM MED ADULT FOAM STRAP (SOFTGOODS) IMPLANT
SPONGE GAUZE 4X4 12PLY STER LF (GAUZE/BANDAGES/DRESSINGS) ×1 IMPLANT
SPONGE LAP 4X18 X RAY DECT (DISPOSABLE) IMPLANT
SUCTION FRAZIER TIP 10 FR DISP (SUCTIONS) ×1 IMPLANT
SUT ETHIBOND 2 OS 4 DA (SUTURE) IMPLANT
SUT ETHILON 4 0 PS 2 18 (SUTURE) IMPLANT
SUT MNCRL AB 4-0 PS2 18 (SUTURE) IMPLANT
SUT VIC AB 3-0 PS1 18 (SUTURE)
SUT VIC AB 3-0 PS1 18XBRD (SUTURE) IMPLANT
SYR 5ML LL (SYRINGE) ×2 IMPLANT
SYR TB 1ML LL NO SAFETY (SYRINGE) ×1 IMPLANT
TAPE CLOTH SURG 4X10 WHT LF (GAUZE/BANDAGES/DRESSINGS) ×1 IMPLANT
TAPE PAPER 3X10 WHT MICROPORE (GAUZE/BANDAGES/DRESSINGS) ×2 IMPLANT
TOWEL OR 17X24 6PK STRL BLUE (TOWEL DISPOSABLE) ×2 IMPLANT
TUBE CONNECTING 20X1/4 (TUBING) IMPLANT
WAND STAR VAC 90 (SURGICAL WAND) ×2 IMPLANT
WATER STERILE IRR 1000ML POUR (IV SOLUTION) ×2 IMPLANT

## 2014-11-26 NOTE — Progress Notes (Signed)
Assisted Dr. Singer with left, ultrasound guided, interscalene  block. Side rails up, monitors on throughout procedure. See vital signs in flow sheet. Tolerated Procedure well.  

## 2014-11-26 NOTE — Transfer of Care (Signed)
Immediate Anesthesia Transfer of Care Note  Patient: Rod HollerVanessa J Ballester  Procedure(s) Performed: Procedure(s): LEFT SHOULDER ARTHROSCOPY ACROMIOPLASTY, DISTAL CLAVICAL and labral tear debridment  (Left)  Patient Location: PACU  Anesthesia Type:GA combined with regional for post-op pain  Level of Consciousness: awake, alert , oriented and patient cooperative  Airway & Oxygen Therapy: Patient Spontanous Breathing and Patient connected to face mask oxygen  Post-op Assessment: Report given to PACU RN and Post -op Vital signs reviewed and stable  Post vital signs: Reviewed and stable  Complications: No apparent anesthesia complications

## 2014-11-26 NOTE — Anesthesia Preprocedure Evaluation (Addendum)
Anesthesia Evaluation  Patient identified by MRN, date of birth, ID band Patient awake    Reviewed: Allergy & Precautions, H&P , NPO status , Patient's Chart, lab work & pertinent test results  History of Anesthesia Complications (+) PONV and history of anesthetic complications  Airway Mallampati: III  TM Distance: >3 FB Neck ROM: full  Mouth opening: Limited Mouth Opening  Dental  (+) Teeth Intact, Dental Advidsory Given   Pulmonary neg pulmonary ROS,  breath sounds clear to auscultation        Cardiovascular negative cardio ROS  Rhythm:regular Rate:Normal     Neuro/Psych TIAnegative psych ROS   GI/Hepatic Neg liver ROS, Medicated,  Endo/Other  negative endocrine ROS  Renal/GU negative Renal ROS     Musculoskeletal   Abdominal   Peds  Hematology   Anesthesia Other Findings   Reproductive/Obstetrics negative OB ROS                            Anesthesia Physical Anesthesia Plan  ASA: II  Anesthesia Plan: General ETT   Post-op Pain Management: MAC Combined w/ Regional for Post-op pain   Induction:   Airway Management Planned:   Additional Equipment:   Intra-op Plan:   Post-operative Plan:   Informed Consent: I have reviewed the patients History and Physical, chart, labs and discussed the procedure including the risks, benefits and alternatives for the proposed anesthesia with the patient or authorized representative who has indicated his/her understanding and acceptance.   Dental Advisory Given  Plan Discussed with: Anesthesiologist, CRNA and Surgeon  Anesthesia Plan Comments:         Anesthesia Quick Evaluation

## 2014-11-26 NOTE — Anesthesia Procedure Notes (Addendum)
Anesthesia Regional Block:  Interscalene brachial plexus block  Pre-Anesthetic Checklist: ,, timeout performed, Correct Patient, Correct Site, Correct Laterality, Correct Procedure, Correct Position, site marked, Risks and benefits discussed,  Surgical consent,  Pre-op evaluation,  At surgeon's request and post-op pain management  Laterality: Left  Prep: chloraprep       Needles:  Injection technique: Single-shot  Needle Type: Echogenic Stimulator Needle     Needle Length: 5cm 5 cm Needle Gauge: 22 and 22 G    Additional Needles:  Procedures: ultrasound guided (picture in chart) and nerve stimulator Interscalene brachial plexus block  Nerve Stimulator or Paresthesia:  Response: bicep contraction, 0.45 mA,   Additional Responses:   Narrative:  Start time: 11/26/2014 11:52 AM End time: 11/26/2014 12:03 PM Injection made incrementally with aspirations every 5 mL.  Performed by: Personally  Anesthesiologist: Heather RobertsSINGER, JAMES  Additional Notes: Functioning IV was confirmed and monitors applied.  A 50mm 22ga echogenic arrow stimulator was used. Sterile prep and drape,hand hygiene and sterile gloves were used.Ultrasound guidance: relevant anatomy identified, needle position confirmed, local anesthetic spread visualized around nerve(s)., vascular puncture avoided.  Image printed for medical record.  Negative aspiration and negative test dose prior to incremental administration of local anesthetic. The patient tolerated the procedure well.   Procedure Name: Intubation Date/Time: 11/26/2014 1:12 PM Performed by: Daquon Greenleaf Pre-anesthesia Checklist: Patient identified, Emergency Drugs available, Suction available and Patient being monitored Patient Re-evaluated:Patient Re-evaluated prior to inductionOxygen Delivery Method: Circle System Utilized Preoxygenation: Pre-oxygenation with 100% oxygen Intubation Type: IV induction Ventilation: Mask ventilation without  difficulty Laryngoscope Size: Mac and 3 Tube type: Oral Tube size: 7.0 mm Number of attempts: 1 Airway Equipment and Method: stylet,  oral airway and Video-laryngoscopy Placement Confirmation: ETT inserted through vocal cords under direct vision,  positive ETCO2 and breath sounds checked- equal and bilateral Secured at: 21 cm Tube secured with: Tape Dental Injury: Teeth and Oropharynx as per pre-operative assessment  Difficulty Due To: Difficulty was unanticipated Comments: DLx1 with MAC 3 unable to view VC,easy mask ventilation, immediate DL with glide scope able to see partial VC but unable to pass 7.0 ETT due to angle (limited oral opening, small TM distance) , DL with MAC 3 unable to see VC but able to pass ischman stylet pass the epiglottis into trachea with 7.0 ETT placed atraumatic, teeth unchanged, +ETCO2, =BBS

## 2014-11-26 NOTE — Discharge Instructions (Addendum)
Surgery for Impingement Syndrome, Subacromial Decompression °Subacromial decompression surgery is for patients with rotator cuff tendinitis, subacromial bursitis (inflamed, fluid-filled sac between the shoulder joint and top of the shoulder blade), or impingement syndrome (inflamed rotator cuff tendons due to pinching). Surgery is for patients with continued shoulder pain despite at least 3 months of rehabilitation treatment. The shoulder pain is so severe that it affects patients' daily activities or greatly decreases their quality of life. Patients who will benefit most from surgery are those whose shoulder bone (acromion) has a curve, hook, or bump (spur), or those who have a partial rotator cuff tear. There are 3 purposes of surgery. First, the inflamed bursa is removed. Second, the shoulder bone defect (curve, hook, spur) is removed. Third, the coracoacromial ligament is cut. This surgery is intended to reduce pain by increasing space in the area so that the rotator cuff is less likely to be pinched. °REASONS NOT TO OPERATE  °· Infection of the shoulder joint. °· Patient is unable or unwilling to complete the postoperative program. This includes keeping the shoulder in a sling or immobilizer (if open surgery is performed), or performing the needed rehabilitation. °· Emotional or psychological conditions that contribute to the shoulder condition. °· Patients who have rotator cuff inflammation due to other causes. This includes impingement caused by shoulder instability, weak shoulder blade muscles (scapula), shoulder arthritis, stiff or frozen shoulder, or a large os acromiale (failure of the shoulder bone growth plates to fuse properly). °RISKS AND COMPLICATIONS  °· Infection. °· Bleeding. °· Injury to nerves (numbness, weakness, paralysis). °· Continued or recurring pain. °· Detachment of the deltoid shoulder muscle (if open surgery is performed). °· Stiffness or loss of shoulder motion. °· Decrease in  athletic performance. °· Shoulder weakness. °· Fracture of the shoulder bone. °· Pain in the joint connecting the shoulder bone and collarbone. °· Removal of too much or too little shoulder bone. °TECHNIQUE °Technique used may vary between surgeons. In general, the surgery is performed with a flexible tube and tools inserted in a few small slits near the joint (arthroscopic). It may also be completed through an open cut (incision). The goal of the procedure is to remove the bursa, remove the shoulder bone deformity, and cut the coracoacromial ligament. Electricity will be used to sear the small capillaries (cauterize) to stop small amounts of bleeding. Other tools used are an electric or motorized shaver to remove the bursa, and a small power drill (burr) to remove the deformity of the shoulder bone.  °If the procedure is completed with an open incision, the surgeon will detach the deltoid shoulder muscle from the shoulder bone and cut the coracoacromial ligament. The deformity of the shoulder bone is then removed, using a saw or chisel (osteotome). A file (rasp) may be used to smooth the edges. Finally, the bursa is removed with scissors, and the deltoid muscle is reattached to the shoulder bone.  °HOME CARE INSTRUCTIONS  °· Postoperative care depends on the surgical technique used (arthroscopic or open). °· Follow the instructions given to you by your surgeon. °· Keep the wound clean and dry for 10 to 14 days after surgery, especially if open surgery is performed. °· Wear a sling, brace, or immobilizer as prescribed by your surgeon. This often lasts a couple days for arthroscopic procedures, or 6 to 8 weeks for open procedures, because the deltoid muscle must heal. °· You will be given pain medicines by your caregiver or surgeon. Take only as much medicine as   you need.  You may be advised to perform motion exercises immediately after surgery. These may be performed at home or with a therapist.  Postoperative  rehabilitation and exercises are very important to regain motion, and later, strength. RETURN TO SPORTS   6 weeks is the minimum waiting time required before returning to play. Open procedure surgeries are often longer.  Return to sports depends on the type of sport and the position played.  A therapist must assess your strength and range of motion before athletics may be resumed. SEEK MEDICAL CARE IF:   You experience pain, numbness, or coldness in the hand.  Blue, gray, or dark color appears in the fingernails.  Any of the following occur after surgery:  Increased pain, swelling, redness, drainage of fluids, or bleeding in the affected area.  Signs of infection (headache, muscle aches, dizziness, a general ill feeling with fever).  New, unexplained symptoms develop. (Drugs used in treatment may produce side effects.) Do not eat or drink anything before surgery. Solid food makes general anesthesia more hazardous.  Document Released: 11/30/2005 Document Revised: 04/16/2014 Document Reviewed: 03/14/2009 [facility] Patient Information 2015 [place]. This information is not intended to replace advice given to you by your health care provider. Make sure you discuss any questions you have with your health care provider.  Regional Anesthesia Blocks  1. Numbness or the inability to move the "blocked" extremity may last from 3-48 hours after placement. The length of time depends on the medication injected and your individual response to the medication. If the numbness is not going away after 48 hours, call your surgeon.  2. The extremity that is blocked will need to be protected until the numbness is gone and the  Strength has returned. Because you cannot feel it, you will need to take extra care to avoid injury. Because it may be weak, you may have difficulty moving it or using it. You may not know what position it is in without looking at it while the block is in effect.  3. For blocks  in the legs and feet, returning to weight bearing and walking needs to be done carefully. You will need to wait until the numbness is entirely gone and the strength has returned. You should be able to move your leg and foot normally before you try and bear weight or walk. You will need someone to be with you when you first try to ensure you do not fall and possibly risk injury.  4. Bruising and tenderness at the needle site are common side effects and will resolve in a few days.  5. Persistent numbness or new problems with movement should be communicated to the surgeon or the [facility] [phone])/ [facility]  [phone]).  Post Anesthesia Home Care Instructions  Activity: Get plenty of rest for the remainder of the day. A responsible adult should stay with you for 24 hours following the procedure.  For the next 24 hours, DO NOT: -Drive a car -Advertising copywriterperate machinery -Drink alcoholic beverages -Take any medication unless instructed by your physician -Make any legal decisions or sign important papers.  Meals: Start with liquid foods such as gelatin or soup. Progress to regular foods as tolerated. Avoid greasy, spicy, heavy foods. If nausea and/or vomiting occur, drink only clear liquids until the nausea and/or vomiting subsides. Call your physician if vomiting continues.  Special Instructions/Symptoms: Your throat may feel dry or sore from the anesthesia or the breathing tube placed in your throat  surgery. If this causes discomfort, gargle with warm salt water. The discomfort should disappear within 24 hours. ° ° °

## 2014-11-26 NOTE — Op Note (Signed)
Preoperative diagnosis: L shoulder impingement syndrome, a.c. joint arthritis, possible labral tear   Postoperative diagnosis: Same   Procedure: L shoulder arthroscopic anterior-inferior acromioplasty, formal distal clavicle excision, debridement of degenerative tearing superior labrum  Surgeon: Feliberto GottronFrank J. Turner Danielsowan M.D.   First assistant: Allena KatzEric K Phillips PA-C, was present for entire procedure, was needed for retraction, operation of arthroscopic equipment, placement of dressing.  Anesthetic: Left shoulder interscalene block plus general endotracheal   Estimated blood loss: Minimal   Fluid replacement: 1200 cc of crystalloid.   Indications for procedure: Patient with shoulder impingement syndrome and a.c. joint arthritis who is failed conservative measures with anti-inflammatory medicines, therapy and exercises, but did get temporary relief from a cortisone injection in the subacromial space. MRI scan is consistent with partial thickness bursal side supraspinatus tear less than 50%. Because of increasing pain and weakness patient desires elective arthroscopic evaluation with acromioplasty, distal clavicle excision and we will also address any other intra-articular pathology. Risks and benefits of surgery were discussed prior to the procedure and all questions answered.   Description of procedure: Patient was identified by arm band and given preoperative IV antibiotics in the holding area, as well as interscalene block anesthetic.Marland Kitchen. Patient was taken to the operating room where the appropriate anesthetic monitors were attached and general endotracheal anesthesia was induced with the patient in the supine position. Patient was then placed in the beachchair position and the left upper extremity prepped and draped in the usual sterile fashion from the wrist to the hemithorax. A time out procedure was performed. We began the operation by making standard portals 1.5 cm anterior to the acromion, 1.5 cm lateral  to the junction of the middle and posterior thirds of the acromion, and 1.5 cm posterior the posterior lateral corner of the acromion process. The inflow with gravity was placed anteriorly, the arthroscope laterally, and a 4.2 great-white sucker shaver posteriorly. The subacromial bursa was resected and we visualized the subacromial spur which was then removed with a 4.5 hooded vortex bur making 2 passes. We then visualized the arthritic a.c. joint and the inferior distal centimeter of the clavicle was resected using a 4.5 hooded vortex bur from the posterior portal. We then brought the burr anteriorly, the scope posteriorly, and the inflow laterally completing the 1 cm distal clavicle excision. Moving into the glenohumeral joint the articular and the labral cartilages were visualized and degenerative tearing of the superior labor that included a parrot-beak tear near the biceps origin was debrided. At this point the shoulder was irrigated out normal saline solution the arthroscopic instruments removed and a dressing of Xerofoam 4 x 4 dressing sponges, paper tape, and sling applied the patient was then placed in supine awakened extubated and taken to the recovery without difficulty.

## 2014-11-26 NOTE — Interval H&P Note (Signed)
History and Physical Interval Note:  11/26/2014 12:34 PM  Alexis Frye  has presented today for surgery, with the diagnosis of LEFT SHOULDER IMPINGMENT AND PARTIAL ROTATOR CUFF TEAR  The various methods of treatment have been discussed with the patient and family. After consideration of risks, benefits and other options for treatment, the patient has consented to  Procedure(s): LEFT SHOULDER ARTHROSCOPY ACROMIOPLASTY DISTAL CLAVICAL EXCISION POSSIBLE  ROTATOR CUFF REPAIR (Left) as a surgical intervention .  The patient's history has been reviewed, patient examined, no change in status, stable for surgery.  I have reviewed the patient's chart and labs.  Questions were answered to the patient's satisfaction.     Nestor LewandowskyOWAN,Kenly Henckel J

## 2014-11-27 ENCOUNTER — Encounter (HOSPITAL_BASED_OUTPATIENT_CLINIC_OR_DEPARTMENT_OTHER): Payer: Self-pay | Admitting: Orthopedic Surgery

## 2014-11-28 NOTE — Anesthesia Postprocedure Evaluation (Signed)
  Anesthesia Post-op Note  Patient: Alexis HollerVanessa J Beeck  Procedure(s) Performed: Procedure(s): LEFT SHOULDER ARTHROSCOPY ACROMIOPLASTY, DISTAL CLAVICAL and labral tear debridment  (Left)  Patient Location: PACU  Anesthesia Type:General  Level of Consciousness: awake and alert   Airway and Oxygen Therapy: Patient Spontanous Breathing  Post-op Pain: none  Post-op Assessment: Post-op Vital signs reviewed  Post-op Vital Signs: stable  Last Vitals:  Filed Vitals:   11/26/14 1553  BP: 138/78  Pulse: 83  Temp: 36.7 C  Resp: 16    Complications: No apparent anesthesia complications

## 2014-12-04 ENCOUNTER — Encounter (HOSPITAL_BASED_OUTPATIENT_CLINIC_OR_DEPARTMENT_OTHER): Payer: Self-pay | Admitting: Orthopedic Surgery

## 2015-08-29 ENCOUNTER — Ambulatory Visit (INDEPENDENT_AMBULATORY_CARE_PROVIDER_SITE_OTHER): Payer: 59 | Admitting: Neurology

## 2015-08-29 ENCOUNTER — Encounter: Payer: Self-pay | Admitting: Neurology

## 2015-08-29 VITALS — BP 129/84 | HR 63 | Ht 63.0 in | Wt 190.0 lb

## 2015-08-29 DIAGNOSIS — R9402 Abnormal brain scan: Secondary | ICD-10-CM | POA: Diagnosis not present

## 2015-08-29 DIAGNOSIS — R51 Headache: Secondary | ICD-10-CM | POA: Diagnosis not present

## 2015-08-29 DIAGNOSIS — R519 Headache, unspecified: Secondary | ICD-10-CM

## 2015-08-29 MED ORDER — TIZANIDINE HCL 2 MG PO TABS: 2.0000 mg | ORAL_TABLET | Freq: Three times a day (TID) | ORAL | Status: DC

## 2015-08-29 NOTE — Progress Notes (Signed)
PATIENT: Alexis Frye DOB: 05/09/1951  Chief Complaint  Patient presents with  . Headache    Says she has been battling frequent headaches since her son was born 34 years ago.  She has tried multiple medications.  She had an implanted neurostimulator for 15 years that stopped functioning.  It was removed in November 2015.  . Arm Pain    She has been having pain in her left arm.  She had left shoulder surgery in December 2015 but feels the pain is unrelated.     HISTORICAL  Alexis Frye is a 64 years old right-handed female, seen in refer by  his primary care physician Dr. Gildardo Cranker for evaluation of headaches, left occipital area pain   She reported headache since 64 years old, always located at left occipital region, spreading forward, she described constant pressure pain, initially she responded to left occipital nerve block, eventually was evaluated by [facility] around 2000, had left occipital area neurostimulator placement, but was not sure about the benefit, was removed in 2015.  She is now have constant occipital area headaches, pressure pain, 4 out of 10, spreading forward sometimes, become more severe 10 out of 10 headache, usually triggered by stress, relieved by sleeping dark quiet room, during her headaches, she has nausea, often made worse by Tylenol  She denied visual loss, no lateralized motor or sensory deficit, complains of a lot of work-related stress.  She was treated with topiramate since 2015 without help   She also reported a history of abnormal brain scan in the past, but could not elaborate on detail, she is worried about the changes, wants to have repeat brain scan.  REVIEW OF SYSTEMS: Full 14 system review of systems performed and notable only for headaches  ALLERGIES: No Known Allergies  HOME MEDICATIONS: Current Outpatient Prescriptions  Medication Sig Dispense Refill  . aspirin 81 MG tablet Take 81 mg by mouth daily.    Marland Kitchen atorvastatin  (LIPITOR) 20 MG tablet Take 20 mg by mouth daily.    . cholecalciferol (VITAMIN D) 1000 UNITS tablet Take 1,000 Units by mouth daily.    Marland Kitchen esomeprazole (NEXIUM) 20 MG capsule Take 20 mg by mouth daily at 12 noon.    . loratadine (CLARITIN) 10 MG tablet Take 10 mg by mouth daily.    . meloxicam (MOBIC) 7.5 MG tablet Take 7.5 mg by mouth daily.    . Multiple Vitamins-Minerals (HAIR VITAMINS) TABS Take by mouth.    . topiramate (TOPAMAX) 25 MG tablet Take 25 mg by mouth. Take 3 capsules daily.     No current facility-administered medications for this visit.    PAST MEDICAL HISTORY: Past Medical History  Diagnosis Date  . GERD (gastroesophageal reflux disease)   . Hyperlipemia   . Seasonal allergies   . Wears glasses   . Arthritis   . TIA (transient ischemic attack) 2012    no residual  . PONV (postoperative nausea and vomiting)   . Arm pain left  . Headache     PAST SURGICAL HISTORY: Past Surgical History  Procedure Laterality Date  . Abdominal hysterectomy    . Dilation and curettage of uterus    . Cesarean section      x2  . Laryngoscopy      vocal cord polyps  . Diagnostic laparoscopy    . Tonsillectomy    . Cholecystectomy    . Colonoscopy    . Shoulder arthroscopy with rotator cuff repair  Left 11/26/2014    Procedure: LEFT SHOULDER ARTHROSCOPY W/ SAD/DCR (no cuff repair);  Surgeon: Nestor Lewandowsky, MD;  Location: [facility];  Service: Orthopedics;  Laterality: Left;    FAMILY HISTORY: Family History  Problem Relation Age of Onset  . Emphysema Mother   . Heart disease Mother   . Osteoporosis Mother   . Alcoholism Father     SOCIAL HISTORY:  Social History   Social History  . Marital Status: Married    Spouse Name: N/A  . Number of Children: 2  . Years of Education: 16   Occupational History  . 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 Smoking status: Never Smoker   . Smokeless tobacco: Not  on file  . Alcohol Use: 0.0 oz/week    0 Standard drinks or equivalent per week     Comment: Occasional wine  . Drug Use: No  . Sexual Activity: Not on file   Other Topics Concern  . Not on file   Social History Narrative   Lives at home with her husband and mother-in-law.   Right-handed.   2 cups caffeine per day.     PHYSICAL EXAM   Filed Vitals:   08/29/15 0724  BP: 129/84  Pulse: 63  Height:  (1.6 m)  Weight: 190 lb (86.183 kg)    Not recorded      Body mass index is 33.67 kg/(m^2).  PHYSICAL EXAMNIATION:  Gen: NAD, conversant, well nourised, obese, well groomed                     Cardiovascular: Regular rate rhythm, no peripheral edema, warm, nontender. Eyes: Conjunctivae clear without exudates or hemorrhage Neck: Supple, no carotid bruise. Pulmonary: Clear to auscultation bilaterally   NEUROLOGICAL EXAM:  MENTAL STATUS: Speech:    Speech is normal; fluent and spontaneous with normal comprehension.  Cognition:     Orientation to time, place and person     Normal recent and remote memory     Normal Attention span and concentration     Normal Language, naming, repeating,spontaneous speech     Fund of knowledge   CRANIAL NERVES: CN II: Visual fields are full to confrontation. Fundoscopic exam is normal with sharp discs and no vascular changes. Pupils are round equal and briskly reactive to light. CN III, IV, VI: extraocular movement are normal. No ptosis. CN V: Facial sensation is intact to pinprick in all 3 divisions bilaterally. Corneal responses are intact.  CN VII: Face is symmetric with normal eye closure and smile. CN VIII: Hearing is normal to rubbing fingers CN IX, X: Palate elevates symmetrically. Phonation is normal. CN XI: Head turning and shoulder shrug are intact CN XII: Tongue is midline with normal movements and no atrophy.  MOTOR: There is no pronator drift of out-stretched arms. Muscle bulk and tone are normal. Muscle strength is  normal.  REFLEXES: Reflexes are 2+ and symmetric at the biceps, triceps, knees, and ankles. Plantar responses are flexor.  SENSORY: Intact to light touch, pinprick, position sense, and vibration sense are intact in fingers and toes.  COORDINATION: Rapid alternating movements and fine finger movements are intact. There is no dysmetria on finger-to-nose and heel-knee-shin.    GAIT/STANCE: Posture is normal. Gait is steady with normal steps, base, arm swing, and turning. Heel and toe walking are normal. Tandem gait is normal.  Romberg is absent.   DIAGNOSTIC  DATA (LABS, IMAGING, TESTING) - I reviewed patient records, labs, notes, testing and imaging myself where available.   ASSESSMENT AND PLAN  Alexis Frye is a 64 y.o. female   Headaches  Cervicogenic headaches  I have suggested neck stretching exercise, hot compression  Tapering off topiramate, she has tried it for one year without significant help  Tizanidine 2 mg 3 times a day  She also reported abnormal brain scan in the past, we will repeat MRI of the brain  As needed Tylenol, NSAIDs for headaches   Levert Feinstein, M.D. Ph.D.  [facility] [address], [address] [place], [place] [address] Ph: [phone] Fax: [phone]  [id number] Tenny Craw, MD

## 2015-09-25 ENCOUNTER — Ambulatory Visit (INDEPENDENT_AMBULATORY_CARE_PROVIDER_SITE_OTHER): Payer: 59

## 2015-09-25 DIAGNOSIS — R519 Headache, unspecified: Secondary | ICD-10-CM

## 2015-09-25 DIAGNOSIS — R51 Headache: Secondary | ICD-10-CM

## 2015-09-26 ENCOUNTER — Telehealth: Payer: Self-pay | Admitting: Neurology

## 2015-09-26 NOTE — Telephone Encounter (Signed)
Left message letting patient know the results below.  Left our number to call with any questions.

## 2015-09-26 NOTE — Telephone Encounter (Signed)
Alexis Frye  Please call patient, MRI of the brain showed no significant abnormalities  Mildly abnormal MRI brain (without) demonstrating: 1. Few small scattered periventricular and subcortical foci of non-specific gliosis.  2. No acute findings.

## 2015-10-29 ENCOUNTER — Ambulatory Visit (INDEPENDENT_AMBULATORY_CARE_PROVIDER_SITE_OTHER): Payer: 59 | Admitting: Neurology

## 2015-10-29 ENCOUNTER — Encounter: Payer: Self-pay | Admitting: Neurology

## 2015-10-29 ENCOUNTER — Encounter (INDEPENDENT_AMBULATORY_CARE_PROVIDER_SITE_OTHER): Payer: Self-pay

## 2015-10-29 VITALS — BP 124/74 | HR 61 | Ht 63.0 in | Wt 189.0 lb

## 2015-10-29 DIAGNOSIS — R51 Headache: Secondary | ICD-10-CM

## 2015-10-29 DIAGNOSIS — R519 Headache, unspecified: Secondary | ICD-10-CM | POA: Insufficient documentation

## 2015-10-29 NOTE — Progress Notes (Signed)
Chief Complaint  Patient presents with  . Headache    She would like to review MRI results. The addition of tizanidine has been helpful for her headaches.  She still has stiffness in her neck.      PATIENT: Alexis HollerVanessa J Kleckley DOB: 1951-05-12  Chief Complaint  Patient presents with  . Headache    She would like to review MRI results. The addition of tizanidine has been helpful for her headaches.  She still has stiffness in her neck.     HISTORICAL  Alexis HollerVanessa J Stodghill is a 64 years old right-handed female, seen in refer by  his primary care physician Dr. Gildardo Crankerharles Ross for evaluation of headaches, left occipital area pain   She reported headache since 64 years old, always located at left occipital region, spreading forward, she described constant pressure pain, initially she responded to left occipital nerve block, eventually was evaluated by [facility] around 2000, had left occipital area neurostimulator placement, but was not sure about the benefit, was removed in 2015.  She is now have constant occipital area headaches, pressure pain, 4 out of 10, spreading forward sometimes, become more severe 10 out of 10 headache, usually triggered by stress, relieved by sleeping dark quiet room, during her headaches, she has nausea, often made worse by Tylenol  She denied visual loss, no lateralized motor or sensory deficit, complains of a lot of work-related stress.  She was treated with topiramate since 2015 without help   She also reported a history of abnormal brain scan in the past, but could not elaborate on detail, she is worried about the changes, wants to have repeat brain scan.  UPDATE Oct 29 2015: She still has left occipital area tenderness, getting worse if she turning her head, had a catch at her left occipital area, her headache has much improved with tizanidine 2 mg 2-3 times a day, she complains mild drowsiness with medications  REVIEW OF SYSTEMS: Full 14 system review of systems  performed and notable only for headaches  ALLERGIES: No Known Allergies  HOME MEDICATIONS: Current Outpatient Prescriptions  Medication Sig Dispense Refill  . aspirin 81 MG tablet Take 81 mg by mouth daily.    Marland Kitchen. atorvastatin (LIPITOR) 20 MG tablet Take 20 mg by mouth daily.    . cholecalciferol (VITAMIN D) 1000 UNITS tablet Take 1,000 Units by mouth daily.    Marland Kitchen. esomeprazole (NEXIUM) 20 MG capsule Take 20 mg by mouth daily at 12 noon.    . loratadine (CLARITIN) 10 MG tablet Take 10 mg by mouth daily.    . meloxicam (MOBIC) 7.5 MG tablet Take 7.5 mg by mouth daily.    . Multiple Vitamins-Minerals (HAIR VITAMINS) TABS Take by mouth.    Marland Kitchen. tiZANidine (ZANAFLEX) 2 MG tablet Take 1 tablet (2 mg total) by mouth 3 (three) times daily. 90 tablet 6   No current facility-administered medications for this visit.    PAST MEDICAL HISTORY: Past Medical History  Diagnosis Date  . GERD (gastroesophageal reflux disease)   . Hyperlipemia   . Seasonal allergies   . Wears glasses   . Arthritis   . TIA (transient ischemic attack) 2012    no residual  . PONV (postoperative nausea and vomiting)   . Arm pain left  . Headache     PAST SURGICAL HISTORY: Past Surgical History  Procedure Laterality Date  . Abdominal hysterectomy    . Dilation and curettage of uterus    . Cesarean section  x2  . Laryngoscopy      vocal cord polyps  . Diagnostic laparoscopy    . Tonsillectomy    . Cholecystectomy    . Colonoscopy    . Shoulder arthroscopy with rotator cuff repair Left 11/26/2014    Procedure: LEFT SHOULDER ARTHROSCOPY W/ SAD/DCR (no cuff repair);  Surgeon: Nestor Lewandowsky, MD;  Location: [facility];  Service: Orthopedics;  Laterality: Left;    FAMILY HISTORY: Family History  Problem Relation Age of Onset  . Emphysema Mother   . Heart disease Mother   . Osteoporosis Mother   . Alcoholism Father     SOCIAL HISTORY:  Social History   Social History  . Marital Status:  Married    Spouse Name: N/A  . Number of Children: 2  . Years of Education: 16   Occupational History  . 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 Smoking status: Never Smoker   . Smokeless tobacco: Not on file  . Alcohol Use: 0.0 oz/week    0 Standard drinks or equivalent per week     Comment: Occasional wine  . Drug Use: No  . Sexual Activity: Not on file   Other Topics Concern  . Not on file   Social History Narrative   Lives at home with her husband and mother-in-law.   Right-handed.   2 cups caffeine per day.     PHYSICAL EXAM   Filed Vitals:   10/29/15 0815  BP: 124/74  Pulse: 61  Height:  (1.6 m)  Weight: 189 lb (85.73 kg)    Not recorded      Body mass index is 33.49 kg/(m^2).  PHYSICAL EXAMNIATION:  Gen: NAD, conversant, well nourised, obese, well groomed                     Cardiovascular: Regular rate rhythm, no peripheral edema, warm, nontender. Eyes: Conjunctivae clear without exudates or hemorrhage Neck: Supple, no carotid bruise. Pulmonary: Clear to auscultation bilaterally   NEUROLOGICAL EXAM:  MENTAL STATUS: Speech:    Speech is normal; fluent and spontaneous with normal comprehension.  Cognition:     Orientation to time, place and person     Normal recent and remote memory     Normal Attention span and concentration     Normal Language, naming, repeating,spontaneous speech     Fund of knowledge   CRANIAL NERVES: CN II: Visual fields are full to confrontation. Fundoscopic exam is normal with sharp discs and no vascular changes. Pupils are round equal and briskly reactive to light. CN III, IV, VI: extraocular movement are normal. No ptosis. CN V: Facial sensation is intact to pinprick in all 3 divisions bilaterally. Corneal responses are intact.  CN VII: Face is symmetric with normal eye closure and smile. CN VIII: Hearing is normal to rubbing fingers CN IX, X: Palate elevates  symmetrically. Phonation is normal. CN XI: Head turning and shoulder shrug are intact CN XII: Tongue is midline with normal movements and no atrophy.  MOTOR: There is no pronator drift of out-stretched arms. Muscle bulk and tone are normal. Muscle strength is normal.  REFLEXES: Reflexes are 2+ and symmetric at the biceps, triceps, knees, and ankles. Plantar responses are flexor.  SENSORY: Intact to light touch, pinprick, position sense, and vibration sense are intact in fingers and toes.  COORDINATION: Rapid alternating movements and fine finger movements are intact. There is  no dysmetria on finger-to-nose and heel-knee-shin.    GAIT/STANCE: Posture is normal. Gait is steady with normal steps, base, arm swing, and turning. Heel and toe walking are normal. Tandem gait is normal.  Romberg is absent.   DIAGNOSTIC DATA (LABS, IMAGING, TESTING) - I reviewed patient records, labs, notes, testing and imaging myself where available.   ASSESSMENT AND PLAN  NICKOLETTE ESPINOLA is a 64 y.o. female    Headaches  Cervicogenic headaches  I have suggested neck stretching exercise, hot compression  Tizanidine 2 mg 3 times a day  Reviewed MRI of the brain film with patient, mild nonspecific gliosis   As needed Tylenol, NSAIDs for headaches Possible obstructive sleep apnea  She has narrow oropharyngeal, does have nighttime snoring, she decided to hold off sleep study at this point  Levert Feinstein, M.D. Ph.D.  [facility] [address] [place], [place] [address] Ph: [phone] Fax: [phone]  ZH:[email or web address] Tenny Craw, MD

## 2015-11-26 ENCOUNTER — Encounter: Payer: Self-pay | Admitting: Neurology

## 2015-11-26 ENCOUNTER — Ambulatory Visit (INDEPENDENT_AMBULATORY_CARE_PROVIDER_SITE_OTHER): Payer: 59 | Admitting: Neurology

## 2015-11-26 VITALS — BP 167/90 | HR 69 | Ht 63.0 in | Wt 194.0 lb

## 2015-11-26 DIAGNOSIS — R519 Headache, unspecified: Secondary | ICD-10-CM

## 2015-11-26 DIAGNOSIS — R51 Headache: Secondary | ICD-10-CM | POA: Diagnosis not present

## 2015-11-26 MED ORDER — NORTRIPTYLINE HCL 10 MG PO CAPS: ORAL_CAPSULE | ORAL | Status: DC

## 2015-11-26 NOTE — Progress Notes (Signed)
Chief Complaint  Patient presents with  . Occipital Headache    Reports headaches have been more intense over the last week.  She has been taking her tizanidine and using ice packs.      PATIENT: Alexis Frye DOB: 1951-08-31  Chief Complaint  Patient presents with  . Occipital Headache    Reports headaches have been more intense over the last week.  She has been taking her tizanidine and using ice packs.     HISTORICAL  Alexis Frye is a 64 years old right-handed female, seen in refer by  his primary care physician Dr. Gildardo Crankerharles Ross for evaluation of headaches, left occipital area pain   She reported headache since 64 years old, always located at left occipital region, spreading forward, she described constant pressure pain, initially she responded to left occipital nerve block, eventually was evaluated by [facility] around 2000, had left occipital area neurostimulator placement, but was not sure about the benefit, was removed in 2015.  She is now have constant occipital area headaches, pressure pain, 4 out of 10, spreading forward sometimes, become more severe 10 out of 10 headache, usually triggered by stress, relieved by sleeping dark quiet room, during her headaches, she has nausea, often made worse by Tylenol  She denied visual loss, no lateralized motor or sensory deficit, complains of a lot of work-related stress.  She was treated with topiramate since 2015 without help   She also reported a history of abnormal brain scan in the past, but could not elaborate on detail, she is worried about the changes, wants to have repeat brain scan.  UPDATE Oct 29 2015: She still has left occipital area tenderness, getting worse if she turning her head, had a catch at her left occipital area, her headache has much improved with tizanidine 2 mg 2-3 times a day, she complains mild drowsiness with medications  UPDATE Nov 26 2015: She continue frequent bilateral upper nuchal area  pain, spreading forward to become occipital area headaches, left worse than right, she denies arm or leg weakness.  She has occasionally light sensitivity, denies nausea or vomiting.  REVIEW OF SYSTEMS: Full 14 system review of systems performed and notable only for headaches  ALLERGIES: No Known Allergies  HOME MEDICATIONS: Current Outpatient Prescriptions  Medication Sig Dispense Refill  . aspirin 81 MG tablet Take 81 mg by mouth daily.    Marland Kitchen. atorvastatin (LIPITOR) 20 MG tablet Take 20 mg by mouth daily.    . cholecalciferol (VITAMIN D) 1000 UNITS tablet Take 1,000 Units by mouth daily.    Marland Kitchen. esomeprazole (NEXIUM) 20 MG capsule Take 20 mg by mouth daily at 12 noon.    . loratadine (CLARITIN) 10 MG tablet Take 10 mg by mouth daily.    . meloxicam (MOBIC) 7.5 MG tablet Take 7.5 mg by mouth daily.    . Multiple Vitamins-Minerals (HAIR VITAMINS) TABS Take by mouth.    Marland Kitchen. tiZANidine (ZANAFLEX) 2 MG tablet Take 1 tablet (2 mg total) by mouth 3 (three) times daily. 90 tablet 6   No current facility-administered medications for this visit.    PAST MEDICAL HISTORY: Past Medical History  Diagnosis Date  . GERD (gastroesophageal reflux disease)   . Hyperlipemia   . Seasonal allergies   . Wears glasses   . Arthritis   . TIA (transient ischemic attack) 2012    no residual  . PONV (postoperative nausea and vomiting)   . Arm pain left  . Headache  PAST SURGICAL HISTORY: Past Surgical History  Procedure Laterality Date  . Abdominal hysterectomy    . Dilation and curettage of uterus    . Cesarean section      x2  . Laryngoscopy      vocal cord polyps  . Diagnostic laparoscopy    . Tonsillectomy    . Cholecystectomy    . Colonoscopy    . Shoulder arthroscopy with rotator cuff repair Left 11/26/2014    Procedure: LEFT SHOULDER ARTHROSCOPY W/ SAD/DCR (no cuff repair);  Surgeon: Nestor Lewandowsky, MD;  Location: [facility];  Service: Orthopedics;  Laterality: Left;     FAMILY HISTORY: Family History  Problem Relation Age of Onset  . Emphysema Mother   . Heart disease Mother   . Osteoporosis Mother   . Alcoholism Father     SOCIAL HISTORY:  Social History   Social History  . Marital Status: Married    Spouse Name: N/A  . Number of Children: 2  . Years of Education: 16   Occupational History  . 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 Smoking status: Never Smoker   . Smokeless tobacco: Not on file  . Alcohol Use: 0.0 oz/week    0 Standard drinks or equivalent per week     Comment: Occasional wine  . Drug Use: No  . Sexual Activity: Not on file   Other Topics Concern  . Not on file   Social History Narrative   Lives at home with her husband and mother-in-law.   Right-handed.   2 cups caffeine per day.     PHYSICAL EXAM   Filed Vitals:   11/26/15 1453  BP: 167/90  Pulse: 69  Height:  (1.6 m)  Weight: 194 lb (87.998 kg)    Not recorded      Body mass index is 34.37 kg/(m^2).  PHYSICAL EXAMNIATION:  Gen: NAD, conversant, well nourised, obese, well groomed                     Cardiovascular: Regular rate rhythm, no peripheral edema, warm, nontender. Eyes: Conjunctivae clear without exudates or hemorrhage Neck: Supple, no carotid bruise. Pulmonary: Clear to auscultation bilaterally   NEUROLOGICAL EXAM:  MENTAL STATUS: Speech:    Speech is normal; fluent and spontaneous with normal comprehension.  Cognition:     Orientation to time, place and person     Normal recent and remote memory     Normal Attention span and concentration     Normal Language, naming, repeating,spontaneous speech     Fund of knowledge   CRANIAL NERVES: CN II: Visual fields are full to confrontation. Fundoscopic exam is normal with sharp discs and no vascular changes. Pupils are round equal and briskly reactive to light. CN III, IV, VI: extraocular movement are normal. No ptosis. CN  V: Facial sensation is intact to pinprick in all 3 divisions bilaterally. Corneal responses are intact.  CN VII: Face is symmetric with normal eye closure and smile. CN VIII: Hearing is normal to rubbing fingers CN IX, X: Palate elevates symmetrically. Phonation is normal. CN XI: Head turning and shoulder shrug are intact CN XII: Tongue is midline with normal movements and no atrophy.  MOTOR: There is no pronator drift of out-stretched arms. Muscle bulk and tone are normal. Muscle strength is normal.  REFLEXES: Reflexes are 2+ and symmetric at the biceps, triceps, knees, and ankles. Plantar  responses are flexor.  SENSORY: Intact to light touch, pinprick, position sense, and vibration sense are intact in fingers and toes.  COORDINATION: Rapid alternating movements and fine finger movements are intact. There is no dysmetria on finger-to-nose and heel-knee-shin.    GAIT/STANCE: Posture is normal. Gait is steady with normal steps, base, arm swing, and turning. Heel and toe walking are normal. Tandem gait is normal.  Romberg is absent.   DIAGNOSTIC DATA (LABS, IMAGING, TESTING) - I reviewed patient records, labs, notes, testing and imaging myself where available.   ASSESSMENT AND PLAN  TORRYN FISKE is a 64 y.o. female    Headaches  Cervicogenic headaches  I have suggested neck stretching exercise, hot compression  Tizanidine 2 mg 3 times a day  Reviewed MRI of the brain film with patient, mild nonspecific gliosis   As needed Tylenol, NSAIDs for headaches  Possible obstructive sleep apnea  She has narrow oropharyngeal, does have nighttime snoring, she decided to hold off sleep study at this point  Levert Feinstein, M.D. Ph.D.  [facility] [address], [address] [place], [place] [address] Ph: [phone] Fax: [phone]  ZH:[email or web address] Tenny Craw, MD

## 2015-11-27 NOTE — Progress Notes (Signed)
Patient continued to complains of upper nuchal area headaches, tenderness of nuchal line upon deep palpitation.  4% Lidocaine 1.5 cc mixed with betamethasone 1.5cc (30mg /5cc=6mg /cc)  Injection was performed along bilateral nuchal line, 3 injection sites at either left or right. Dosage was divided evenly  She tolerated the injection well, noticed mild improvement of nuchal area tenderness after injection

## 2016-01-09 ENCOUNTER — Ambulatory Visit: Payer: 59 | Admitting: Neurology

## 2016-03-31 ENCOUNTER — Emergency Department (HOSPITAL_COMMUNITY): Payer: BLUE CROSS/BLUE SHIELD

## 2016-03-31 ENCOUNTER — Observation Stay (HOSPITAL_COMMUNITY)
Admission: EM | Admit: 2016-03-31 | Discharge: 2016-04-01 | Disposition: A | Discharge status: Home / Self Care | Payer: BLUE CROSS/BLUE SHIELD | Attending: Internal Medicine | Admitting: Internal Medicine

## 2016-03-31 ENCOUNTER — Encounter (HOSPITAL_COMMUNITY): Payer: Self-pay | Admitting: Family Medicine

## 2016-03-31 DIAGNOSIS — K210 Gastro-esophageal reflux disease with esophagitis: Secondary | ICD-10-CM

## 2016-03-31 DIAGNOSIS — R079 Chest pain, unspecified: Principal | ICD-10-CM | POA: Diagnosis present

## 2016-03-31 DIAGNOSIS — Z7982 Long term (current) use of aspirin: Secondary | ICD-10-CM | POA: Diagnosis not present

## 2016-03-31 DIAGNOSIS — M1990 Unspecified osteoarthritis, unspecified site: Secondary | ICD-10-CM | POA: Diagnosis not present

## 2016-03-31 DIAGNOSIS — Z79899 Other long term (current) drug therapy: Secondary | ICD-10-CM | POA: Diagnosis not present

## 2016-03-31 DIAGNOSIS — E785 Hyperlipidemia, unspecified: Secondary | ICD-10-CM | POA: Diagnosis not present

## 2016-03-31 DIAGNOSIS — K219 Gastro-esophageal reflux disease without esophagitis: Secondary | ICD-10-CM

## 2016-03-31 DIAGNOSIS — Z8673 Personal history of transient ischemic attack (TIA), and cerebral infarction without residual deficits: Secondary | ICD-10-CM | POA: Diagnosis not present

## 2016-03-31 DIAGNOSIS — Z8249 Family history of ischemic heart disease and other diseases of the circulatory system: Secondary | ICD-10-CM | POA: Diagnosis not present

## 2016-03-31 DIAGNOSIS — I2510 Atherosclerotic heart disease of native coronary artery without angina pectoris: Secondary | ICD-10-CM | POA: Diagnosis not present

## 2016-03-31 DIAGNOSIS — M7918 Myalgia, other site: Secondary | ICD-10-CM

## 2016-03-31 DIAGNOSIS — I10 Essential (primary) hypertension: Secondary | ICD-10-CM | POA: Diagnosis not present

## 2016-03-31 DIAGNOSIS — IMO0001 Reserved for inherently not codable concepts without codable children: Secondary | ICD-10-CM

## 2016-03-31 LAB — BASIC METABOLIC PANEL
Anion gap: 9 (ref 5–15)
BUN: 15 mg/dL (ref 6–20)
CALCIUM: 9.4 mg/dL (ref 8.9–10.3)
CO2: 20 mmol/L — AB (ref 22–32)
CREATININE: 0.77 mg/dL (ref 0.44–1.00)
Chloride: 112 mmol/L — ABNORMAL HIGH (ref 101–111)
GFR calc non Af Amer: >60 mL/min (ref >60)
Glucose, Bld: 92 mg/dL (ref 65–99)
Potassium: 4.3 mmol/L (ref 3.5–5.1)
SODIUM: 141 mmol/L (ref 135–145)

## 2016-03-31 LAB — TROPONIN I: Troponin I: <0.03 ng/mL (ref <0.031)

## 2016-03-31 LAB — CBC
HCT: 41.1 % (ref 36.0–46.0)
HEMATOCRIT: 40.4 % (ref 36.0–46.0)
HEMOGLOBIN: 13.6 g/dL (ref 12.0–15.0)
Hemoglobin: 14.3 g/dL (ref 12.0–15.0)
MCH: 31.2 pg (ref 26.0–34.0)
MCH: 30.1 pg (ref 26.0–34.0)
MCHC: 34.8 g/dL (ref 30.0–36.0)
MCHC: 33.7 g/dL (ref 30.0–36.0)
MCV: 89.5 fL (ref 78.0–100.0)
MCV: 89.4 fL (ref 78.0–100.0)
PLATELETS: 145 10*3/uL — AB (ref 150–400)
Platelets: 197 10*3/uL (ref 150–400)
RBC: 4.59 MIL/uL (ref 3.87–5.11)
RBC: 4.52 MIL/uL (ref 3.87–5.11)
RDW: 12.9 % (ref 11.5–15.5)
RDW: 12.8 % (ref 11.5–15.5)
WBC: 5.9 10*3/uL (ref 4.0–10.5)
WBC: 6.6 10*3/uL (ref 4.0–10.5)

## 2016-03-31 LAB — CREATININE, SERUM: Creatinine, Ser: 0.75 mg/dL (ref 0.44–1.00)

## 2016-03-31 LAB — I-STAT TROPONIN, ED
TROPONIN I, POC: 0 ng/mL (ref 0.00–0.08)
Troponin i, poc: 0 ng/mL (ref 0.00–0.08)

## 2016-03-31 MED ORDER — SODIUM CHLORIDE 0.9% FLUSH: 3.0000 mL | Freq: Two times a day (BID) | INTRAVENOUS | Status: DC

## 2016-03-31 MED ORDER — SODIUM CHLORIDE 0.9% FLUSH: 3.0000 mL | INTRAVENOUS | Status: DC | PRN

## 2016-03-31 MED ORDER — SODIUM CHLORIDE 0.9% FLUSH
3.0000 mL | Freq: Two times a day (BID) | INTRAVENOUS | Status: DC
  Administered 2016-03-31 – 2016-04-01 (×3): 3 mL via INTRAVENOUS

## 2016-03-31 MED ORDER — ACETAMINOPHEN 650 MG RE SUPP: 650.0000 mg | Freq: Four times a day (QID) | RECTAL | Status: DC | PRN

## 2016-03-31 MED ORDER — LORATADINE 10 MG PO TABS
10.0000 mg | ORAL_TABLET | Freq: Every day | ORAL | Status: DC
  Administered 2016-03-31 – 2016-04-01 (×2): 10 mg via ORAL
  Filled 2016-03-31 (×2): qty 1

## 2016-03-31 MED ORDER — ONDANSETRON HCL 4 MG/2ML IJ SOLN: 4.0000 mg | Freq: Four times a day (QID) | INTRAMUSCULAR | Status: DC | PRN

## 2016-03-31 MED ORDER — SODIUM CHLORIDE 0.9 % IV SOLN: 250.0000 mL | INTRAVENOUS | Status: DC | PRN

## 2016-03-31 MED ORDER — ONDANSETRON HCL 4 MG PO TABS: 4.0000 mg | ORAL_TABLET | Freq: Four times a day (QID) | ORAL | Status: DC | PRN

## 2016-03-31 MED ORDER — GI COCKTAIL ~~LOC~~
30.0000 mL | Freq: Four times a day (QID) | ORAL | Status: DC | PRN
  Administered 2016-03-31: 30 mL via ORAL
  Filled 2016-03-31: qty 30

## 2016-03-31 MED ORDER — ASPIRIN 325 MG PO TABS
325.0000 mg | ORAL_TABLET | Freq: Every day | ORAL | Status: DC
Start: 2016-03-31 — End: 2016-04-01
  Filled 2016-03-31 (×2): qty 1

## 2016-03-31 MED ORDER — ACETAMINOPHEN 325 MG PO TABS: 650.0000 mg | ORAL_TABLET | Freq: Four times a day (QID) | ORAL | Status: DC | PRN

## 2016-03-31 MED ORDER — ACETAMINOPHEN 325 MG PO TABS: 650.0000 mg | ORAL_TABLET | ORAL | Status: DC | PRN

## 2016-03-31 MED ORDER — LABETALOL HCL 5 MG/ML IV SOLN: 5.0000 mg | INTRAVENOUS | Status: DC | PRN

## 2016-03-31 MED ORDER — ATORVASTATIN CALCIUM 20 MG PO TABS
20.0000 mg | ORAL_TABLET | Freq: Every day | ORAL | Status: DC
  Administered 2016-03-31 – 2016-04-01 (×2): 20 mg via ORAL
  Filled 2016-03-31: qty 2
  Filled 2016-03-31: qty 1
  Filled 2016-03-31: qty 2

## 2016-03-31 MED ORDER — ENOXAPARIN SODIUM 40 MG/0.4ML ~~LOC~~ SOLN
40.0000 mg | SUBCUTANEOUS | Status: DC
  Administered 2016-03-31: 40 mg via SUBCUTANEOUS
  Filled 2016-03-31: qty 0.4

## 2016-03-31 MED ORDER — PANTOPRAZOLE SODIUM 40 MG PO TBEC
80.0000 mg | DELAYED_RELEASE_TABLET | Freq: Every day | ORAL | Status: DC
  Administered 2016-03-31 – 2016-04-01 (×2): 80 mg via ORAL
  Filled 2016-03-31 (×2): qty 2

## 2016-03-31 MED ORDER — TIZANIDINE HCL 2 MG PO TABS
2.0000 mg | ORAL_TABLET | Freq: Three times a day (TID) | ORAL | Status: DC
  Administered 2016-03-31 – 2016-04-01 (×3): 2 mg via ORAL
  Filled 2016-03-31 (×5): qty 1

## 2016-03-31 MED ORDER — ONDANSETRON HCL 4 MG/2ML IJ SOLN
4.0000 mg | Freq: Four times a day (QID) | INTRAMUSCULAR | Status: DC | PRN
Start: 2016-03-31 — End: 2016-04-01

## 2016-03-31 NOTE — ED Notes (Signed)
Taking po fluids and tolerating well.

## 2016-03-31 NOTE — Progress Notes (Signed)
Patient felt chest pressure relief about 45min after GI cocktail was given.  Minerva Endsiffany N Norvella Loscalzo

## 2016-03-31 NOTE — H&P (Signed)
History and Physical    Alexis Frye AVW:[id number] DOB: 11-27-1951 DOA: 03/31/2016  Referring MD/NP/PA: Elana Frye PCP:  Alexis Lope, MD  Outpatient Specialists: none Patient coming from: PCP office  Chief Complaint: CP  HPI: Alexis Frye is a 65 y.o. female with medical history significant of GERD, HLD, TIA, headache, white coat hypertension who presents with chest pain. . Gradual onset. Occurred while patient was at rest. Substernal. Pressure-like sensation. Radiated to left arm. No change with exertion, deep breathing, or chest wall palpation. Denies nausea, headache, LOC, lightheadedness, dizziness, SOB. Patient seen by primary care physician and had EKG performed in the patient to come to emergency room immediately. Patient was given nitroglycerin at PCPs office with resolution of chest pain. Patient currently chest pain-free. No ASA given  ED Course: AFVSS. EKG w/o ACS but non-specific changes. CXR negative.   Review of Systems: As per HPI otherwise 10 point review of systems negative.    Past Medical History  Diagnosis Date  . GERD (gastroesophageal reflux disease)   . Hyperlipemia   . Seasonal allergies   . Wears glasses   . Arthritis   . TIA (transient ischemic attack) 2012    no residual  . PONV (postoperative nausea and vomiting)   . Arm pain left  . Headache     Past Surgical History  Procedure Laterality Date  . Abdominal hysterectomy    . Dilation and curettage of uterus    . Cesarean section      x2  . Laryngoscopy      vocal cord polyps  . Diagnostic laparoscopy    . Tonsillectomy    . Cholecystectomy    . Colonoscopy    . Shoulder arthroscopy with rotator cuff repair Left 11/26/2014    Procedure: LEFT SHOULDER ARTHROSCOPY W/ SAD/DCR (no cuff repair);  Surgeon: Nestor Lewandowsky, MD;  Location: [facility];  Service: Orthopedics;  Laterality: Left;     reports that she has never smoked. She does not have any smokeless tobacco history  on file. She reports that she drinks alcohol. She reports that she does not use illicit drugs.  Allergies  Allergen Reactions  . Codeine Other (See Comments)    unk reaction    Family History  Problem Relation Age of Onset  . Emphysema Mother   . Heart disease Mother   . Osteoporosis Mother   . Alcoholism Father   . Heart attack Sister     smoker  . Heart attack Sister     smoker     Prior to Admission medications   Medication Sig Start Date End Date Taking? Authorizing Provider  aspirin 81 MG tablet Take 81 mg by mouth daily.   Yes Historical Provider, MD  atorvastatin (LIPITOR) 20 MG tablet Take 20 mg by mouth daily.   Yes Historical Provider, MD  cholecalciferol (VITAMIN D) 1000 UNITS tablet Take 1,000 Units by mouth daily.   Yes Historical Provider, MD  loratadine (CLARITIN) 10 MG tablet Take 10 mg by mouth daily.   Yes Historical Provider, MD  meloxicam (MOBIC) 7.5 MG tablet Take 7.5 mg by mouth daily.   Yes Historical Provider, MD  Multiple Vitamins-Minerals (HAIR VITAMINS) TABS Take by mouth.   Yes Historical Provider, MD  tiZANidine (ZANAFLEX) 2 MG tablet Take 1 tablet (2 mg total) by mouth 3 (three) times daily. Patient taking differently: Take 1-2 mg by mouth 2 (two) times daily.  08/29/15  Yes Levert Feinstein, MD  esomeprazole (  NEXIUM) 20 MG capsule Take 20 mg by mouth daily at 12 noon.    Historical Provider, MD  nortriptyline (PAMELOR) 10 MG capsule 1 tablet every night for one week then 2 tablets every night Patient not taking: Reported on 03/31/2016 11/26/15   Levert FeinsteinYijun Yan, MD    Physical Exam: Filed Vitals:   03/31/16 1030 03/31/16 1130 03/31/16 1200 03/31/16 1230  BP: 151/91 157/97 173/84 168/100  Pulse: 61 63 68 67  Temp:      TempSrc:      Resp: 24 13 17 17   SpO2: 95% 96% 97% 93%      Constitutional: NAD, calm, comfortable Filed Vitals:   03/31/16 1030 03/31/16 1130 03/31/16 1200 03/31/16 1230  BP: 151/91 157/97 173/84 168/100  Pulse: 61 63 68 67  Temp:       TempSrc:      Resp: 24 13 17 17   SpO2: 95% 96% 97% 93%   Eyes: PERRL, lids and conjunctivae normal ENMT: Mucous membranes are moist. Posterior pharynx clear of any exudate or lesions.Normal dentition.  Neck: normal, supple, no masses, no thyromegaly Respiratory: clear to auscultation bilaterally, no wheezing, no crackles. Normal respiratory effort. No accessory muscle use.  Cardiovascular: Regular rate and rhythm, no murmurs / rubs / gallops. No extremity edema. 2+ pedal pulses. No carotid bruits.  Abdomen: no tenderness, no masses palpated. No hepatosplenomegaly. Bowel sounds positive.  Musculoskeletal: no clubbing / cyanosis. No joint deformity upper and lower extremities. Good ROM, no contractures. Normal muscle tone.  Skin: no rashes, lesions, ulcers. No induration Neurologic: CN 2-12 grossly intact. Sensation intact, DTR normal. Strength 5/5 in all 4.  Psychiatric: Normal judgment and insight. Alert and oriented x 3. Normal mood.     Labs on Admission: I have personally reviewed following labs and imaging studies  CBC:  Recent Labs Lab 03/31/16 1032  WBC 5.9  HGB 14.3  HCT 41.1  MCV 89.5  PLT 145*   Basic Metabolic Panel:  Recent Labs Lab 03/31/16 1032  NA 141  K 4.3  CL 112*  CO2 20*  GLUCOSE 92  BUN 15  CREATININE 0.77  CALCIUM 9.4   GFR: CrCl cannot be calculated (Unknown ideal weight.). Liver Function Tests: No results for input(s): AST, ALT, ALKPHOS, BILITOT, PROT, ALBUMIN in the last 168 hours. No results for input(s): LIPASE, AMYLASE in the last 168 hours. No results for input(s): AMMONIA in the last 168 hours. Coagulation Profile: No results for input(s): INR, PROTIME in the last 168 hours. Cardiac Enzymes: No results for input(s): CKTOTAL, CKMB, CKMBINDEX, TROPONINI in the last 168 hours. BNP (last 3 results) No results for input(s): PROBNP in the last 8760 hours. HbA1C: No results for input(s): HGBA1C in the last 72 hours. CBG: No results  for input(s): GLUCAP in the last 168 hours. Lipid Profile: No results for input(s): CHOL, HDL, LDLCALC, TRIG, CHOLHDL, LDLDIRECT in the last 72 hours. Thyroid Function Tests: No results for input(s): TSH, T4TOTAL, FREET4, T3FREE, THYROIDAB in the last 72 hours. Anemia Panel: No results for input(s): VITAMINB12, FOLATE, FERRITIN, TIBC, IRON, RETICCTPCT in the last 72 hours. Urine analysis:    Component Value Date/Time   COLORURINE YELLOW 07/25/2011 0421   APPEARANCEUR CLEAR 07/25/2011 0421   LABSPEC 1.010 07/25/2011 0421   PHURINE 7.0 07/25/2011 0421   GLUCOSEU NEGATIVE 07/25/2011 0421   HGBUR NEGATIVE 07/25/2011 0421   BILIRUBINUR NEGATIVE 07/25/2011 0421   KETONESUR NEGATIVE 07/25/2011 0421   PROTEINUR NEGATIVE 07/25/2011 0421   UROBILINOGEN 0.2 07/25/2011  0421   NITRITE NEGATIVE 07/25/2011 0421   LEUKOCYTESUR TRACE* 07/25/2011 0421   Sepsis Labs: (procalcitonin:4,lacticidven:4) )No results found for this or any previous visit (from the past 240 hour(s)).   Radiological Exams on Admission: Dg Chest 2 View  03/31/2016  CLINICAL DATA:  Chest pressure extending into the left arm at work today. History of TIA and gastroesophageal reflux disease. EXAM: CHEST  2 VIEW COMPARISON:  Radiographs 07/24/2013. FINDINGS: Suboptimal inspiration on the lateral view. The heart size and mediastinal contours are normal. The lungs are clear. There is no pleural effusion or pneumothorax. No acute osseous findings are identified. IMPRESSION: No active cardiopulmonary process Electronically Signed   By: Carey Bullocks M.D.   On: 03/31/2016 10:54      Assessment/Plan Active Problems:   Chest pain   White coat hypertension   HLD (hyperlipidemia)   Musculoskeletal pain   GERD (gastroesophageal reflux disease)   CP: HEART score 5-6. EKG w/ T wave chnges but no definitive ACS. Trop negative. GI vs cardiac. Pt w/ h/o GERD on Meloxicam  - Tele, Obs - GI cocktail - Cycle trop - EKG in  am - Cards consult if CP returns or + Trop - ASA, Nitro  HTN: Hypertensive on admission. Pt never on medication. States nml when at home - IV Labetolol PRN  HLD: - continue statin  GERD: - continue PPI  MSK pain: chronic - continue zanaflex    DVT prophylaxis:  Lovenox  Code Status: FULL  Family Communication: Sister  Disposition Plan: Observation. Likely DC 04/01/16  Consults called: None  Admission status: Obs  MERRELL, DAVID J MD [facility]   If 7PM-7AM, please contact night-coverage [email or web address] Password [email or web address]  03/31/2016, 1:24 PM

## 2016-03-31 NOTE — ED Notes (Signed)
Attempted to call report

## 2016-03-31 NOTE — ED Notes (Signed)
Per verbal with Readback from [place], Phlebotomist/[facility] - I-stat Troponin is 0.00

## 2016-03-31 NOTE — ED Notes (Signed)
[facility] EMS with c/o chest pain radiating to left arm.  [facility] to [facility] when she began having CP at work this morning, they performed an EKG and noted possible ischemic changes in the lateral leads.  The office adminitered 1SL nitro and 325mg  ASA PO and this relieved her pain from a 5/10 to a 0/10.  Pt is A&Ox4 and in NAD, ambulatory in treatment room.

## 2016-03-31 NOTE — ED Provider Notes (Signed)
CSN: [id number]     Arrival date & time 03/31/16  1003 History   First MD Initiated Contact with Patient 03/31/16 1023     Chief Complaint  Patient presents with  . Chest Pain     (Consider location/radiation/quality/duration/timing/severity/associated sxs/prior Treatment) Patient is a 65 y.o. female presenting with chest pain.  Chest Pain    Patient is a 65 year old female past medical history of GERD and TIA who presents the ED with complaint of chest pain, onset 8 AM. Patient reports she was sitting and resting at work she began having gradual onset of midsternal chest pressure but she reports radiated down her left arm. Denies any aggravating or alleviating factors. Denies fever, chills, body ache, headache, lightheadedness, dizziness, visual changes, cough, shortness of breath, wheezing, diaphoresis, abdominal pain, nausea, vomiting, diarrhea, numbness, tingling, weakness. Patient reports [facility] where she had an EKG performed which was noted to have possible ischemic changes and lateral leads. Patient was given 1 SL nitro and  ASA in the office. Patient reports resolution of symptoms after administration of nitroglycerin. Patient denies any prior cardiac history. She notes she takes one baby aspirin daily.  Past Medical History  Diagnosis Date  . GERD (gastroesophageal reflux disease)   . Hyperlipemia   . Seasonal allergies   . Wears glasses   . Arthritis   . TIA (transient ischemic attack) 2012    no residual  . PONV (postoperative nausea and vomiting)   . Arm pain left  . Headache    Past Surgical History  Procedure Laterality Date  . Abdominal hysterectomy    . Dilation and curettage of uterus    . Cesarean section      x2  . Laryngoscopy      vocal cord polyps  . Diagnostic laparoscopy    . Tonsillectomy    . Cholecystectomy    . Colonoscopy    . Shoulder arthroscopy with rotator cuff repair Left 11/26/2014    Procedure: LEFT  SHOULDER ARTHROSCOPY W/ SAD/DCR (no cuff repair);  Surgeon: Nestor Lewandowsky, MD;  Location: [facility];  Service: Orthopedics;  Laterality: Left;   Family History  Problem Relation Age of Onset  . Emphysema Mother   . Heart disease Mother   . Osteoporosis Mother   . Alcoholism Father    Social History  Substance Use Topics  . Smoking status: Never Smoker   . Smokeless tobacco: None  . Alcohol Use: 0.0 oz/week    0 Standard drinks or equivalent per week     Comment: Occasional wine   OB History    No data available     Review of Systems  Cardiovascular: Positive for chest pain.  All other systems reviewed and are negative.     Allergies  Codeine  Home Medications   Prior to Admission medications   Medication Sig Start Date End Date Taking? Authorizing Provider  aspirin 81 MG tablet Take 81 mg by mouth daily.   Yes Historical Provider, MD  atorvastatin (LIPITOR) 20 MG tablet Take 20 mg by mouth daily.   Yes Historical Provider, MD  cholecalciferol (VITAMIN D) 1000 UNITS tablet Take 1,000 Units by mouth daily.   Yes Historical Provider, MD  loratadine (CLARITIN) 10 MG tablet Take 10 mg by mouth daily.   Yes Historical Provider, MD  meloxicam (MOBIC) 7.5 MG tablet Take 7.5 mg by mouth daily.   Yes Historical Provider, MD  Multiple Vitamins-Minerals (HAIR VITAMINS) TABS  Take by mouth.   Yes Historical Provider, MD  tiZANidine (ZANAFLEX) 2 MG tablet Take 1 tablet (2 mg total) by mouth 3 (three) times daily. Patient taking differently: Take 1-2 mg by mouth 2 (two) times daily.  08/29/15  Yes Levert FeinsteinYijun Yan, MD  esomeprazole (NEXIUM) 20 MG capsule Take 20 mg by mouth daily at 12 noon.    Historical Provider, MD  nortriptyline (PAMELOR) 10 MG capsule 1 tablet every night for one week then 2 tablets every night Patient not taking: Reported on 03/31/2016 11/26/15   Levert FeinsteinYijun Yan, MD   BP 168/100 mmHg  Pulse 67  Temp(Src) 98.2 F (36.8 C) (Oral)  Resp 17  SpO2 93% Physical  Exam  Constitutional: She is oriented to person, place, and time. She appears well-developed and well-nourished. No distress.  HENT:  Head: Normocephalic and atraumatic.  Mouth/Throat: Oropharynx is clear and moist. No oropharyngeal exudate.  Eyes: Conjunctivae and EOM are normal. Pupils are equal, round, and reactive to light. Right eye exhibits no discharge. Left eye exhibits no discharge. No scleral icterus.  Neck: Normal range of motion. Neck supple.  Cardiovascular: Normal rate, regular rhythm, normal heart sounds and intact distal pulses.   Pulmonary/Chest: Effort normal and breath sounds normal. No respiratory distress. She has no wheezes. She has no rales. She exhibits no tenderness.  Abdominal: Soft. Bowel sounds are normal. She exhibits no distension and no mass. There is no tenderness. There is no rebound and no guarding.  Musculoskeletal: Normal range of motion. She exhibits no edema.  Lymphadenopathy:    She has no cervical adenopathy.  Neurological: She is alert and oriented to person, place, and time.  Skin: Skin is warm and dry. She is not diaphoretic.  Nursing note and vitals reviewed.   ED Course  Procedures (including critical care time) Labs Review Labs Reviewed  BASIC METABOLIC PANEL - Abnormal; Notable for the following:    Chloride 112 (*)    CO2 20 (*)    All other components within normal limits  CBC - Abnormal; Notable for the following:    Platelets 145 (*)    All other components within normal limits  I-STAT TROPOININ, ED    Imaging Review Dg Chest 2 View  03/31/2016  CLINICAL DATA:  Chest pressure extending into the left arm at work today. History of TIA and gastroesophageal reflux disease. EXAM: CHEST  2 VIEW COMPARISON:  Radiographs 07/24/2013. FINDINGS: Suboptimal inspiration on the lateral view. The heart size and mediastinal contours are normal. The lungs are clear. There is no pleural effusion or pneumothorax. No acute osseous findings are  identified. IMPRESSION: No active cardiopulmonary process Electronically Signed   By: Carey BullocksWilliam  Veazey M.D.   On: 03/31/2016 10:54   I have personally reviewed and evaluated these images and lab results as part of my medical decision-making.   EKG Interpretation   Date/Time:  Tuesday March 31 2016 10:06:26 EDT Ventricular Rate:  65 PR Interval:  163 QRS Duration: 95 QT Interval:  404 QTC Calculation: 420 R Axis:   -29 Text Interpretation:  Sinus rhythm Low voltage, precordial leads Probable  left ventricular hypertrophy Anterior Q waves, possibly due to LVH  Borderline T abnormalities, inferior leads Confirmed by ZAMMIT  MD, JOSEPH  [phone] on 03/31/2016 12:08:05 PM      MDM   Final diagnoses:  Chest pain, unspecified chest pain type    Patient presents with chest pain radiating to left arm that started around 8 AM while sitting at work.  Pt was seen by her PCP PTA, EKG revealed lateral ischemic changes, pt given SL nitro and ASA and was sent to the ED via EMS. Pt reports resolution of CP after taking nitro. Denies any prior cardiac history. VSS. Exam unremarkable. During exam, pt reports being symptom free since arrival to the ED. EKG showed T wave inversion in lead III and T wave flattening in lateral leads. Trop negative. CXR negative. Due to EKG changes, will plan to admit pt for obs for cardiac rule out. Discussed results and plan for admission with pt. Consulted hospitalist, Dr. Konrad Dolores agrees to admit pt. Orders placed for obs telemetry bed.     Satira [place], [place] 03/31/16 1303  Bethann Berkshire, MD 03/31/16 1534

## 2016-04-01 ENCOUNTER — Encounter (HOSPITAL_COMMUNITY): Payer: Self-pay | Admitting: Student

## 2016-04-01 ENCOUNTER — Encounter (HOSPITAL_COMMUNITY)
Admission: EM | Disposition: A | Discharge status: Home / Self Care | Payer: Self-pay | Source: Home / Self Care | Attending: Emergency Medicine

## 2016-04-01 DIAGNOSIS — I200 Unstable angina: Secondary | ICD-10-CM | POA: Diagnosis not present

## 2016-04-01 DIAGNOSIS — I10 Essential (primary) hypertension: Secondary | ICD-10-CM | POA: Diagnosis not present

## 2016-04-01 DIAGNOSIS — K219 Gastro-esophageal reflux disease without esophagitis: Secondary | ICD-10-CM | POA: Diagnosis not present

## 2016-04-01 DIAGNOSIS — R079 Chest pain, unspecified: Secondary | ICD-10-CM | POA: Diagnosis not present

## 2016-04-01 DIAGNOSIS — E785 Hyperlipidemia, unspecified: Secondary | ICD-10-CM | POA: Diagnosis not present

## 2016-04-01 HISTORY — PX: CARDIAC CATHETERIZATION: SHX172

## 2016-04-01 LAB — PROTIME-INR
INR: 1.1 (ref 0.00–1.49)
Prothrombin Time: 14.4 seconds (ref 11.6–15.2)

## 2016-04-01 SURGERY — LEFT HEART CATH AND CORONARY ANGIOGRAPHY

## 2016-04-01 MED ORDER — SODIUM CHLORIDE 0.9 % WEIGHT BASED INFUSION
1.0000 mL/kg/h | INTRAVENOUS | Status: DC
Start: 2016-04-01 — End: 2016-04-01

## 2016-04-01 MED ORDER — FENTANYL CITRATE (PF) 100 MCG/2ML IJ SOLN
INTRAMUSCULAR | Status: DC | PRN
  Administered 2016-04-01: 25 ug via INTRAVENOUS

## 2016-04-01 MED ORDER — LIDOCAINE HCL (PF) 1 % IJ SOLN
INTRAMUSCULAR | Status: DC | PRN
  Administered 2016-04-01: 15:00:00

## 2016-04-01 MED ORDER — SODIUM CHLORIDE 0.9% FLUSH: 3.0000 mL | INTRAVENOUS | Status: DC | PRN

## 2016-04-01 MED ORDER — SODIUM CHLORIDE 0.9 % WEIGHT BASED INFUSION
3.0000 mL/kg/h | INTRAVENOUS | Status: DC
Start: 2016-04-01 — End: 2016-04-01
  Administered 2016-04-01: 3 mL/kg/h via INTRAVENOUS

## 2016-04-01 MED ORDER — SODIUM CHLORIDE 0.9% FLUSH: 3.0000 mL | Freq: Two times a day (BID) | INTRAVENOUS | Status: DC

## 2016-04-01 MED ORDER — LIDOCAINE HCL (PF) 1 % IJ SOLN
INTRAMUSCULAR | Status: AC
  Filled 2016-04-01: qty 30

## 2016-04-01 MED ORDER — MIDAZOLAM HCL 2 MG/2ML IJ SOLN
INTRAMUSCULAR | Status: DC | PRN
  Administered 2016-04-01: 1 mg via INTRAVENOUS

## 2016-04-01 MED ORDER — SODIUM CHLORIDE 0.9 % WEIGHT BASED INFUSION: 3.0000 mL/kg/h | INTRAVENOUS | Status: DC

## 2016-04-01 MED ORDER — IOPAMIDOL (ISOVUE-370) INJECTION 76%
INTRAVENOUS | Status: DC | PRN
  Administered 2016-04-01: 40 mL via INTRA_ARTERIAL

## 2016-04-01 MED ORDER — HEPARIN SODIUM (PORCINE) 1000 UNIT/ML IJ SOLN
INTRAMUSCULAR | Status: AC
  Filled 2016-04-01: qty 1

## 2016-04-01 MED ORDER — VERAPAMIL HCL 2.5 MG/ML IV SOLN
INTRAVENOUS | Status: DC | PRN
  Administered 2016-04-01: 15:00:00 via INTRA_ARTERIAL

## 2016-04-01 MED ORDER — SODIUM CHLORIDE 0.9% FLUSH
3.0000 mL | Freq: Two times a day (BID) | INTRAVENOUS | Status: DC
Start: 2016-04-01 — End: 2016-04-01

## 2016-04-01 MED ORDER — MIDAZOLAM HCL 2 MG/2ML IJ SOLN
INTRAMUSCULAR | Status: AC
  Filled 2016-04-01: qty 2

## 2016-04-01 MED ORDER — ASPIRIN 81 MG PO CHEW
81.0000 mg | CHEWABLE_TABLET | ORAL | Status: AC
  Administered 2016-04-01: 81 mg via ORAL
  Filled 2016-04-01: qty 1

## 2016-04-01 MED ORDER — HEPARIN (PORCINE) IN NACL 2-0.9 UNIT/ML-% IJ SOLN
INTRAMUSCULAR | Status: AC
  Filled 2016-04-01: qty 1000

## 2016-04-01 MED ORDER — SODIUM CHLORIDE 0.9 % IV SOLN: 250.0000 mL | INTRAVENOUS | Status: DC | PRN

## 2016-04-01 MED ORDER — FENTANYL CITRATE (PF) 100 MCG/2ML IJ SOLN
INTRAMUSCULAR | Status: AC
  Filled 2016-04-01: qty 2

## 2016-04-01 MED ORDER — HEPARIN SODIUM (PORCINE) 1000 UNIT/ML IJ SOLN
INTRAMUSCULAR | Status: DC | PRN
  Administered 2016-04-01: 4500 [IU] via INTRAVENOUS

## 2016-04-01 MED ORDER — VERAPAMIL HCL 2.5 MG/ML IV SOLN
INTRAVENOUS | Status: AC
  Filled 2016-04-01: qty 2

## 2016-04-01 SURGICAL SUPPLY — 11 items
CATH INFINITI 5 FR JL3.5 (CATHETERS) ×1 IMPLANT
CATH INFINITI 5FR ANG PIGTAIL (CATHETERS) ×1 IMPLANT
CATH INFINITI JR4 5F (CATHETERS) ×1 IMPLANT
DEVICE RAD COMP TR BAND LRG (VASCULAR PRODUCTS) ×1 IMPLANT
GLIDESHEATH SLEND SS 6F .021 (SHEATH) ×1 IMPLANT
KIT HEART LEFT (KITS) ×2 IMPLANT
PACK CARDIAC CATHETERIZATION (CUSTOM PROCEDURE TRAY) ×2 IMPLANT
SYR MEDRAD MARK V 150ML (SYRINGE) ×2 IMPLANT
TRANSDUCER W/STOPCOCK (MISCELLANEOUS) ×2 IMPLANT
TUBING CIL FLEX 10 FLL-RA (TUBING) ×2 IMPLANT
WIRE SAFE-T 1.5MM-J .035X260CM (WIRE) ×1 IMPLANT

## 2016-04-01 NOTE — Progress Notes (Deleted)
PROGRESS NOTE  Rod HollerVanessa J Graziani ZOX:[id number] DOB: 09/28/1951 DOA: 03/31/2016 PCP:  Duane Lopeoss, Alan, MD Outpatient Specialists:      Brief Narrative: 65 y.o. female with medical history significant of GERD, HLD, TIA, headache, white coat hypertension who was admitted on 4/18 with chest pain.  Assessment & Plan: Active Problems:   Chest pain   White coat hypertension   HLD (hyperlipidemia)   Musculoskeletal pain   GERD (gastroesophageal reflux disease)   Chest pain - EKG with T-wave inversions, chronic - Cardiac enzymes have been negative overnight - Patient with 2 more episodes of chest pain/discomfort overnight, resolved - She saw her PCP Dr. Tenny Crawoss yesterday, while having chest pain, and received nitroglycerin with improvement in her pain - Fairly typical symptoms, with chest pain going in her left arm, responsive to nitroglycerin, she has family history of coronary artery disease, consulted cardiology, appreciate input. It looks like she will the undergoing cardiac catheterization later today  Hyperlipidemia - Continue Lipitor  Chronic cervicogenic HA - Outpatient management, seeing neurology  DVT prophylaxis: Lovenox Code Status: Full code Family Communication: no family bedside Disposition Plan: home when ready Barriers for discharge: cath planned  Consultants:   Cardiology   Procedures:   None   Antimicrobials:  None    Subjective: - denies chest pain this morning, no dyspnea, no palpitations  Objective: Filed Vitals:   03/31/16 1500 03/31/16 2136 04/01/16 0458 04/01/16 1150  BP:  108/62 122/69 149/74  Pulse:  54 59 71  Temp:  97.9 F (36.6 C) 98.1 F (36.7 C) 98 F (36.7 C)  TempSrc:  Oral Oral Oral  Resp:  18 18 19   Height: 5\' 3"  (1.6 m)     Weight: 86.183 kg (190 lb)     SpO2:  94% 97% 96%    Intake/Output Summary (Last 24 hours) at 04/01/16 1414 Last data filed at 03/31/16 1818  Gross per 24 hour  Intake    240 ml  Output      0 ml  Net     240 ml   Filed Weights   03/31/16 1500  Weight: 86.183 kg (190 lb)    Examination: Constitutional: NAD, calm, comfortable Filed Vitals:   03/31/16 1500 03/31/16 2136 04/01/16 0458 04/01/16 1150  BP:  108/62 122/69 149/74  Pulse:  54 59 71  Temp:  97.9 F (36.6 C) 98.1 F (36.7 C) 98 F (36.7 C)  TempSrc:  Oral Oral Oral  Resp:  18 18 19   Height: 5\' 3"  (1.6 m)     Weight: 86.183 kg (190 lb)     SpO2:  94% 97% 96%   Eyes: PERRL, lids and conjunctivae normal ENMT: Mucous membranes are moist Respiratory: clear to auscultation bilaterally, no wheezing, no crackles  Cardiovascular: Regular rate and rhythm, no murmurs / rubs / gallops. No extremity edema. 2+ pedal pulses.   Abdomen: no tenderness. Bowel sounds positive.  Musculoskeletal: no clubbing / cyanosis Neurologic: non focal   Data Reviewed: I have personally reviewed following labs and imaging studies  CBC:  Recent Labs Lab 03/31/16 1032 03/31/16 1333  WBC 5.9 6.6  HGB 14.3 13.6  HCT 41.1 40.4  MCV 89.5 89.4  PLT 145* 197   Basic Metabolic Panel:  Recent Labs Lab 03/31/16 1032 03/31/16 1333  NA 141  --   K 4.3  --   CL 112*  --   CO2 20*  --   GLUCOSE 92  --   BUN 15  --  CREATININE 0.77 0.75  CALCIUM 9.4  --    GFR: Estimated Creatinine Clearance: 74.9 mL/min (by C-G formula based on Cr of 0.75). Liver Function Tests: No results for input(s): AST, ALT, ALKPHOS, BILITOT, PROT, ALBUMIN in the last 168 hours. No results for input(s): LIPASE, AMYLASE in the last 168 hours. No results for input(s): AMMONIA in the last 168 hours. Coagulation Profile: No results for input(s): INR, PROTIME in the last 168 hours. Cardiac Enzymes:  Recent Labs Lab 03/31/16 1333 03/31/16 1629  TROPONINI <0.03 <0.03   BNP (last 3 results) No results for input(s): PROBNP in the last 8760 hours. HbA1C: No results for input(s): HGBA1C in the last 72 hours. CBG: No results for input(s): GLUCAP in the last 168  hours. Lipid Profile: No results for input(s): CHOL, HDL, LDLCALC, TRIG, CHOLHDL, LDLDIRECT in the last 72 hours. Thyroid Function Tests: No results for input(s): TSH, T4TOTAL, FREET4, T3FREE, THYROIDAB in the last 72 hours. Anemia Panel: No results for input(s): VITAMINB12, FOLATE, FERRITIN, TIBC, IRON, RETICCTPCT in the last 72 hours. Urine analysis:    Component Value Date/Time   COLORURINE YELLOW 07/25/2011 0421   APPEARANCEUR CLEAR 07/25/2011 0421   LABSPEC 1.010 07/25/2011 0421   PHURINE 7.0 07/25/2011 0421   GLUCOSEU NEGATIVE 07/25/2011 0421   HGBUR NEGATIVE 07/25/2011 0421   BILIRUBINUR NEGATIVE 07/25/2011 0421   KETONESUR NEGATIVE 07/25/2011 0421   PROTEINUR NEGATIVE 07/25/2011 0421   UROBILINOGEN 0.2 07/25/2011 0421   NITRITE NEGATIVE 07/25/2011 0421   LEUKOCYTESUR TRACE* 07/25/2011 0421   Sepsis Labs: Invalid input(s): PROCALCITONIN, LACTICIDVEN  No results found for this or any previous visit (from the past 240 hour(s)).    Radiology Studies: Dg Chest 2 View  03/31/2016  CLINICAL DATA:  Chest pressure extending into the left arm at work today. History of TIA and gastroesophageal reflux disease. EXAM: CHEST  2 VIEW COMPARISON:  Radiographs 07/24/2013. FINDINGS: Suboptimal inspiration on the lateral view. The heart size and mediastinal contours are normal. The lungs are clear. There is no pleural effusion or pneumothorax. No acute osseous findings are identified. IMPRESSION: No active cardiopulmonary process Electronically Signed   By: Carey Bullocks M.D.   On: 03/31/2016 10:54     Scheduled Meds: . aspirin  325 mg Oral Daily  . atorvastatin  20 mg Oral Daily  . enoxaparin (LOVENOX) injection  40 mg Subcutaneous Q24H  . loratadine  10 mg Oral Daily  . pantoprazole  80 mg Oral Daily  . sodium chloride flush  3 mL Intravenous Q12H  . sodium chloride flush  3 mL Intravenous Q12H  . sodium chloride flush  3 mL Intravenous Q12H  . tiZANidine  2 mg Oral TID    Continuous Infusions: . sodium chloride 1 mL/kg/hr (04/01/16 1255)     Pamella Pert, MD, PhD [facility] Pager [phone]  If 7PM-7AM, please contact night-coverage [email or web address] Password [email or web address] 04/01/2016, 2:14 PM

## 2016-04-01 NOTE — H&P (View-Only) (Signed)
  Cardiology Consult    Patient ID: Alexis Frye MRN: [id number], DOB/AGE: 12/18/1950   Admit date: 03/31/2016 Date of Consult: 04/01/2016  Primary Physician:  Ross, Alan, MD Reason for Consult: Chest Pain Primary Cardiologist: New to [facility] Requesting Provider: Dr. Gherghe   History of Present Illness    Alexis Frye is a 65 y.o. female with past medical history of TIA's, GERD, and HLD who presented to [facility] ED on 03/31/2016 for evaluation of chest pain.   She reports having chest pain while at work earlier that morning which radiated into the left arm. Describes it as a pressure and something sitting on her chest. Denied any associated nausea, diaphoresis, or dyspnea. She went to her PCP's office and they performed an EKG concerning for ischemic changes so EMS was called and she was transferred to [place]. Reports being given ASA and SL NTG in the ambulance and had complete resolution of her symptoms.  After having graham crackers in the ED, her pain represented and she was given a GI cocktail with no relief. Reports having GERD in the past (stopped Nexium 6+ months ago and no symptoms until now) but says this feels different from previous episodes of acid reflux.  At this time, she denies any current pain. 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 Reports taking the stairs at work daily and over the past 6 months, has developed worsening dyspnea.  She reports a significant family history of CAD with MI's in both of her sisters occurring in their 50's and 60's. Her mother also has a history of CAD. Denies any personal history of tobacco abuse or excessive alcohol intake.  While admitted, cyclic troponin values have been negative thus far.  CBC showing WBC 5.9, Hgb 14.3, and platelets of 145. Creatinine  stable at 0.77. EKG shows sinus bradycardia with isolated TWI in Leads III and AVR. In reviewing her EKG from 2012, she had similar TWI then which was also present in V1 at that time.  Past Medical History   Past Medical History  Diagnosis Date  . GERD (gastroesophageal reflux disease)   . Hyperlipemia   . Seasonal allergies   . Wears glasses   . Arthritis   . TIA (transient ischemic attack) 2012    no residual  . PONV (postoperative nausea and vomiting)   . Arm pain left  . Headache     Past Surgical History  Procedure Laterality Date  . Abdominal hysterectomy    . Dilation and curettage of uterus    . Cesarean section      x2  . Laryngoscopy      vocal cord polyps  . Diagnostic laparoscopy    . Tonsillectomy    . Cholecystectomy    . Colonoscopy    . Shoulder arthroscopy with rotator cuff repair Left 11/26/2014    Procedure: LEFT SHOULDER ARTHROSCOPY W/ SAD/DCR (no cuff repair);  Surgeon: Frank J Rowan, MD;  Location: [facility];  Service: Orthopedics;  Laterality: Left;     Allergies  Allergies  Allergen Reactions  . Codeine Other (See Comments)    unk reaction    Inpatient Medications    . aspirin  325 mg Oral Daily  . atorvastatin  20 mg Oral Daily  . enoxaparin (LOVENOX) injection  40 mg Subcutaneous Q24H  .   loratadine  10 mg Oral Daily  . pantoprazole  80 mg Oral Daily  . sodium chloride flush  3 mL Intravenous Q12H  . sodium chloride flush  3 mL Intravenous Q12H  . tiZANidine  2 mg Oral TID    Family History    Family History  Problem Relation Age of Onset  . Emphysema Mother   . Heart disease Mother   . Osteoporosis Mother   . Alcoholism Father   . Heart attack Sister 55    smoker  . Heart attack Sister 59    smoker    Social History    Social History   Social History  . Marital Status: Married    Spouse Name: N/A  . Number of Children: 2  . Years of Education: 16   Occupational History  . Systems Director of  Finanacial Aid     [employer]   Social History Main Topics  . Smoking status: Never Smoker   . Smokeless tobacco: Not on file  . Alcohol Use: 0.0 oz/week    0 Standard drinks or equivalent per week     Comment: Occasional wine  . Drug Use: No  . Sexual Activity: Not on file   Other Topics Concern  . Not on file   Social History Narrative   Lives at home with her husband and mother-in-law.   Right-handed.   2 cups caffeine per day.     Review of Systems    General:  No chills, fever, night sweats or weight changes.  Cardiovascular:  No edema, orthopnea, palpitations, paroxysmal nocturnal dyspnea. Positive for chest pain and dyspnea on exertion. Dermatological: No rash, lesions/masses Respiratory: No cough, Positive for dyspnea. Urologic: No hematuria, dysuria Abdominal:   No nausea, vomiting, diarrhea, bright red blood per rectum, melena, or hematemesis Neurologic:  No visual changes, wkns, changes in mental status. All other systems reviewed and are otherwise negative except as noted above.  Physical Exam    Blood pressure 122/69, pulse 59, temperature 98.1 F (36.7 C), temperature source Oral, resp. rate 18, height 5' 3" (1.6 m), weight 190 lb (86.183 kg), SpO2 97 %.  General: Pleasant, Caucasian female appearing in NAD. Psych: Normal affect. Neuro: Alert and oriented X 3. Moves all extremities spontaneously. HEENT: Normal  Neck: Supple without bruits or JVD. Lungs:  Resp regular and unlabored, CTA without wheezing or rales. Heart: RRR no s3, s4, or murmurs. Abdomen: Soft, non-tender, non-distended, BS + x 4.  Extremities: No clubbing, cyanosis or edema. DP/PT/Radials 2+ and equal bilaterally.  Labs    Troponin (Point of Care Test)  Recent Labs  03/31/16 1356  TROPIPOC 0.00    Recent Labs  03/31/16 1333 03/31/16 1629  TROPONINI <0.03 <0.03   Lab Results  Component Value Date   WBC 6.6 03/31/2016   HGB 13.6 03/31/2016   HCT 40.4 03/31/2016    MCV 89.4 03/31/2016   PLT 197 03/31/2016     Recent Labs Lab 03/31/16 1032 03/31/16 1333  NA 141  --   K 4.3  --   CL 112*  --   CO2 20*  --   BUN 15  --   CREATININE 0.77 0.75  CALCIUM 9.4  --   GLUCOSE 92  --    Lab Results  Component Value Date   CHOL 166 07/25/2011   HDL 34* 07/25/2011   LDLCALC 90 07/25/2011   TRIG 211* 07/25/2011   No results found for: DDIMER   Radiology Studies      Dg Chest 2 View: 03/31/2016  CLINICAL DATA:  Chest pressure extending into the left arm at work today. History of TIA and gastroesophageal reflux disease. EXAM: CHEST  2 VIEW COMPARISON:  Radiographs 07/24/2013. FINDINGS: Suboptimal inspiration on the lateral view. The heart size and mediastinal contours are normal. The lungs are clear. There is no pleural effusion or pneumothorax. No acute osseous findings are identified. IMPRESSION: No active cardiopulmonary process Electronically Signed   By: William  Veazey M.D.   On: 03/31/2016 10:54    EKG & Cardiac Imaging    EKG: Sinus bradycardia with isolated TWI in Leads III and AVR. Similar to previous tracing in 2012.  Assessment & Plan    1. Chest Pain concerning for Unstable Angina - developed pain while at work that she describes as a pressure that radiated down her left arm. Had no associated symptoms at that time. Reports a history of worsening dyspnea with exertion for the past 6+ months. However, yesterday while in the ED, her pain was brought on my consuming graham crackers. No exertional component noted. Says this feels different than previous episodes of GERD. However, she reports having a mild pressure at this time even after not consuming anything since midnight. - cyclic troponin values have been negative thus far. EKG shows no acute ischemic changes when compared to previous tracings. - risk factors include HLD and a strong family history of CAD (both sisters had MI's in their 50's). Denies any history of tobacco abuse. - dicussed  with the patient and with Dr. Xachary Hambly. Will plan for a cardiac catheterization later today.  The patient understands that risks included but are not limited to stroke (1 in 1000), death (1 in 1000), kidney failure [usually temporary] (1 in 500), bleeding (1 in 200), allergic reaction [possibly serious] (1 in 200).   2. HLD - continue statin therapy  Signed, Brittany M Strader, PA-C 04/01/2016, 10:52 AM Pager: [phone]  Patient seen, examined. Available data reviewed. Agree with findings, assessment, and plan as outlined by Brittany Strader, PA-C. Exam reveals alert, oriented woman in NAD, lungs CTA, heart RRR without murmur, abd soft, NT, extremities without edema.   EKG shows NSR with normal variant TWA lead III. Enzymes negative. Pt with fairly compelling history: NTG-responsive chest pressure, progressive DOE, and strong family hx of premature CAD. Reviewed diagnostic options and favor definitive evaluation with cath especially in setting of ongoing discomfort this morning. I have reviewed the risks, indications, and alternatives to cardiac catheterization, possible angioplasty, and stenting with the patient. Risks include but are not limited to bleeding, infection, vascular injury, stroke, myocardial infection, arrhythmia, kidney injury, radiation-related injury in the case of prolonged fluoroscopy use, emergency cardiac surgery, and death. The patient understands the risks of serious complication is 1-2 in 1000 with diagnostic cardiac cath and 1-2% or less with angioplasty/stenting.   Chaquita Basques, M.D. 04/01/2016 11:52 AM   

## 2016-04-01 NOTE — Consult Note (Signed)
Cardiology Consult    Patient ID: Alexis Frye MRN: [id number], DOB/AGE: June 29, 1951   Admit date: 03/31/2016 Date of Consult: 04/01/2016  Primary Physician:  [name], MD Reason for Consult: Chest Pain Primary Cardiologist: New to [facility] Requesting Provider: Dr. Elvera Lennox   History of Present Illness    Alexis Frye is a 65 y.o. female with past medical history of TIA's, GERD, and HLD who presented to [facility] ED on 03/31/2016 for evaluation of chest pain.   She reports having chest pain while at work earlier that morning which radiated into the left arm. Describes it as a pressure and something sitting on her chest. Denied any associated nausea, diaphoresis, or dyspnea. She went to her PCP's office and they performed an EKG concerning for ischemic changes so EMS was called and she was transferred to [facility]. Reports being given ASA and SL NTG in the ambulance and had complete resolution of her symptoms.  After having graham crackers in the ED, her pain represented and she was given a GI cocktail with no relief. Reports having GERD in the past (stopped Nexium 6+ months ago and no symptoms until now) but says this feels different from previous episodes of acid reflux.  At this time, she denies any current pain. 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 Reports taking the stairs at work daily and over the past 6 months, has developed worsening dyspnea.  She reports a significant family history of CAD with MI's in both of her sisters occurring in their 18's and 53's. Her mother also has a history of CAD. Denies any personal history of tobacco abuse or excessive alcohol intake.  While admitted, cyclic troponin values have been negative thus far.  CBC showing WBC 5.9, Hgb 14.3, and platelets of 145. Creatinine  stable at 0.77. EKG shows sinus bradycardia with isolated TWI in Leads III and AVR. In reviewing her EKG from 2012, she had similar TWI then which was also present in V1 at that time.  Past Medical History   Past Medical History  Diagnosis Date  . GERD (gastroesophageal reflux disease)   . Hyperlipemia   . Seasonal allergies   . Wears glasses   . Arthritis   . TIA (transient ischemic attack) 2012    no residual  . PONV (postoperative nausea and vomiting)   . Arm pain left  . Headache     Past Surgical History  Procedure Laterality Date  . Abdominal hysterectomy    . Dilation and curettage of uterus    . Cesarean section      x2  . Laryngoscopy      vocal cord polyps  . Diagnostic laparoscopy    . Tonsillectomy    . Cholecystectomy    . Colonoscopy    . Shoulder arthroscopy with rotator cuff repair Left 11/26/2014    Procedure: LEFT SHOULDER ARTHROSCOPY W/ SAD/DCR (no cuff repair);  Surgeon: Nestor Lewandowsky, MD;  Location: [facility];  Service: Orthopedics;  Laterality: Left;     Allergies  Allergies  Allergen Reactions  . Codeine Other (See Comments)    unk reaction    Inpatient Medications    . aspirin  325 mg Oral Daily  . atorvastatin  20 mg Oral Daily  . enoxaparin (LOVENOX) injection  40 mg Subcutaneous Q24H  .  loratadine  10 mg Oral Daily  . pantoprazole  80 mg Oral Daily  . sodium chloride flush  3 mL Intravenous Q12H  . sodium chloride flush  3 mL Intravenous Q12H  . tiZANidine  2 mg Oral TID    Family History    Family History  Problem Relation Age of Onset  . Emphysema Mother   . Heart disease Mother   . Osteoporosis Mother   . Alcoholism Father   . Heart attack Sister 1955    smoker  . Heart attack Sister [id number]    smoker    Social History    Social History   Social History  . Marital Status: Married    Spouse Name: N/A  . Number of Children: 2  . Years of Education: 16   Occupational History  . Copywriter, advertisingystems Director of  [employer]   Social History Main Topics  . Smoking status: Never Smoker   . Smokeless tobacco: Not on file  . Alcohol Use: 0.0 oz/week    0 Standard drinks or equivalent per week     Comment: Occasional wine  . Drug Use: No  . Sexual Activity: Not on file   Other Topics Concern  . Not on file   Social History Narrative   Lives at home with her husband and mother-in-law.   Right-handed.   2 cups caffeine per day.     Review of Systems    General:  No chills, fever, night sweats or weight changes.  Cardiovascular:  No edema, orthopnea, palpitations, paroxysmal nocturnal dyspnea. Positive for chest pain and dyspnea on exertion. Dermatological: No rash, lesions/masses Respiratory: No cough, Positive for dyspnea. Urologic: No hematuria, dysuria Abdominal:   No nausea, vomiting, diarrhea, bright red blood per rectum, melena, or hematemesis Neurologic:  No visual changes, wkns, changes in mental status. All other systems reviewed and are otherwise negative except as noted above.  Physical Exam    Blood pressure 122/69, pulse 59, temperature 98.1 F (36.7 C), temperature source Oral, resp. rate 18, height 5\' 3"  (1.6 m), weight 190 lb (86.183 kg), SpO2 97 %.  General: Pleasant, Caucasian female appearing in NAD. Psych: Normal affect. Neuro: Alert and oriented X 3. Moves all extremities spontaneously. HEENT: Normal  Neck: Supple without bruits or JVD. Lungs:  Resp regular and unlabored, CTA without wheezing or rales. Heart: RRR no s3, s4, or murmurs. Abdomen: Soft, non-tender, non-distended, BS + x 4.  Extremities: No clubbing, cyanosis or edema. DP/PT/Radials 2+ and equal bilaterally.  Labs    Troponin [facility](Point of Care Test)  Recent Labs  03/31/16 1356  TROPIPOC 0.00    Recent Labs  03/31/16 1333 03/31/16 1629  TROPONINI <0.03 <0.03   Lab Results  Component Value Date   WBC 6.6 03/31/2016   HGB 13.6 03/31/2016   HCT 40.4 03/31/2016    MCV 89.4 03/31/2016   PLT 197 03/31/2016     Recent Labs Lab 03/31/16 1032 03/31/16 1333  NA 141  --   K 4.3  --   CL 112*  --   CO2 20*  --   BUN 15  --   CREATININE 0.77 0.75  CALCIUM 9.4  --   GLUCOSE 92  --    Lab Results  Component Value Date   CHOL 166 07/25/2011   HDL 34* 07/25/2011   LDLCALC 90 07/25/2011   TRIG 211* 07/25/2011   No results found for: [facility]   Radiology Studies  Dg Chest 2 View: 03/31/2016  CLINICAL DATA:  Chest pressure extending into the left arm at work today. History of TIA and gastroesophageal reflux disease. EXAM: CHEST  2 VIEW COMPARISON:  Radiographs 07/24/2013. FINDINGS: Suboptimal inspiration on the lateral view. The heart size and mediastinal contours are normal. The lungs are clear. There is no pleural effusion or pneumothorax. No acute osseous findings are identified. IMPRESSION: No active cardiopulmonary process Electronically Signed   By: Carey Bullocks M.D.   On: 03/31/2016 10:54    EKG & Cardiac Imaging    EKG: Sinus bradycardia with isolated TWI in Leads III and AVR. Similar to previous tracing in 2012.  Assessment & Plan    1. Chest Pain concerning for Unstable Angina - developed pain while at work that she describes as a pressure that radiated down her left arm. Had no associated symptoms at that time. Reports a history of worsening dyspnea with exertion for the past 6+ months. However, yesterday while in the ED, her pain was brought on my consuming graham crackers. No exertional component noted. Says this feels different than previous episodes of GERD. However, she reports having a mild pressure at this time even after not consuming anything since midnight. - cyclic troponin values have been negative thus far. EKG shows no acute ischemic changes when compared to previous tracings. - risk factors include HLD and a strong family history of CAD (both sisters had MI's in their 43's). Denies any history of tobacco abuse. - dicussed  with the patient and with Dr. Excell Seltzer. Will plan for a cardiac catheterization later today.  The patient understands that risks included but are not limited to stroke (1 in 1000), death (1 in 1000), kidney failure [usually temporary] (1 in 500), bleeding (1 in 200), allergic reaction [possibly serious] (1 in 200).   2. HLD - continue statin therapy  Signed, Ellsworth Lennox, PA-C 04/01/2016, 10:52 AM Pager: [phone]  Patient seen, examined. Available data reviewed. Agree with findings, assessment, and plan as outlined by Randall An, PA-C. Exam reveals alert, oriented woman in NAD, lungs CTA, heart RRR without murmur, abd soft, NT, extremities without edema.   EKG shows NSR with normal variant TWA lead III. Enzymes negative. Pt with fairly compelling history: NTG-responsive chest pressure, progressive DOE, and strong family hx of premature CAD. Reviewed diagnostic options and favor definitive evaluation with cath especially in setting of ongoing discomfort this morning. I have reviewed the risks, indications, and alternatives to cardiac catheterization, possible angioplasty, and stenting with the patient. Risks include but are not limited to bleeding, infection, vascular injury, stroke, myocardial infection, arrhythmia, kidney injury, radiation-related injury in the case of prolonged fluoroscopy use, emergency cardiac surgery, and death. The patient understands the risks of serious complication is 1-2 in 1000 with diagnostic cardiac cath and 1-2% or less with angioplasty/stenting.   Tonny Bollman, M.D. 04/01/2016 11:52 AM

## 2016-04-01 NOTE — Discharge Summary (Signed)
Physician Discharge Summary  Alexis Frye:[id number] DOB: [birth year] DOA: 03/31/2016  PCP:  Alexis Lope, MD  Admit date: 03/31/2016 Discharge date: 04/01/2016  Time spent: > 30 minutes  Recommendations for Outpatient Follow-up:  1. Follow up with Dr. Tenny Craw as needed   Discharge Diagnoses:  Active Problems:   Chest pain   White coat hypertension   HLD (hyperlipidemia)   Musculoskeletal pain   GERD (gastroesophageal reflux disease)   Pain in the chest  Discharge Condition: stable  Diet recommendation: heart healthy  Filed Weights   03/31/16 1500  Weight: 86.183 kg (190 lb)    History of present illness:  See H&P, Labs, Consult and Test reports for all details in brief, patient is a 65 y.o. female with medical history significant of GERD, HLD, TIA, headache, white coat hypertension who was admitted on 4/18 with chest pain.  Hospital Course:  Chest pain - Patient was admitted to the hospital with chest pain. Given typical symptoms with pain relieved by NTG, recurrent chest pain while in the hospital, progressive dyspnea at home, strong family history of CAD, cardiology was consulted; patient was taken for cardiac catheretization which showed no significant obstructive disease. She was chest pain free, discussed with cardiology, will discharge patient home in stable condition. Hyperlipidemia - Continue Lipitor Chronic cervicogenic HA - Outpatient management, seeing neurology  Procedures:  Cardiac cath   Mid RCA lesion, 15% stenosed.  Prox LAD to Mid LAD lesion, 20% stenosed.  The left ventricular systolic function is normal.  1. Minimal nonobstructive CAD 2. Normal LV function  Consultations:  Cardiology   Discharge Exam: Filed Vitals:   04/01/16 1441 04/01/16 1446 04/01/16 1451 04/01/16 1510  BP: 124/76 126/75  114/86  Pulse: 142 66 66 57  Temp:      TempSrc:      Resp: Height:      Weight:      SpO2: 96% 98% 0%     General:  NAD Cardiovascular: RRR Respiratory: CTA biL  Discharge Instructions Activity:  As tolerated   Get Medicines reviewed and adjusted: Please take all your medications with you for your next visit with your Primary MD  Please request your Primary MD to go over all hospital tests and procedure/radiological results at the follow up, please ask your Primary MD to get all Hospital records sent to his/her office.  If you experience worsening of your admission symptoms, develop shortness of breath, life threatening emergency, suicidal or homicidal thoughts you must seek medical attention immediately by calling 911 or calling your MD immediately if symptoms less severe.  You must read complete instructions/literature along with all the possible adverse reactions/side effects for all the Medicines you take and that have been prescribed to you. Take any new Medicines after you have completely understood and accpet all the possible adverse reactions/side effects.   Do not drive when taking Pain medications.   Do not take more than prescribed Pain, Sleep and Anxiety Medications  Special Instructions: If you have smoked or chewed Tobacco in the last 2 yrs please stop smoking, stop any regular Alcohol and or any Recreational drug use.  Wear Seat belts while driving.  Please note  You were cared for by a hospitalist during your hospital stay. Once you are discharged, your primary care physician will handle any further medical issues. Please note that NO REFILLS for any discharge medications will be authorized once you are discharged, as it is imperative that you  return to your primary care physician (or establish a relationship with a primary care physician if you do not have one) for your aftercare needs so that they can reassess your need for medications and monitor your lab values.    Medication List    TAKE these medications        aspirin 81 MG tablet  Take 81 mg by mouth daily.      atorvastatin 20 MG tablet  Commonly known as:  LIPITOR  Take 20 mg by mouth daily.     cholecalciferol 1000 units tablet  Commonly known as:  VITAMIN D  Take 1,000 Units by mouth daily.     esomeprazole 20 MG capsule  Commonly known as:  NEXIUM  Take 20 mg by mouth daily at 12 noon.     HAIR VITAMINS Tabs  Take by mouth.     loratadine 10 MG tablet  Commonly known as:  CLARITIN  Take 10 mg by mouth daily.     meloxicam 7.5 MG tablet  Commonly known as:  MOBIC  Take 7.5 mg by mouth daily.     nortriptyline 10 MG capsule  Commonly known as:  PAMELOR  1 tablet every night for one week then 2 tablets every night     tiZANidine 2 MG tablet  Commonly known as:  ZANAFLEX  Take 1 tablet (2 mg total) by mouth 3 (three) times daily.           Follow-up Information    Follow up with  Alexis Lopeoss, Alan, MD. Schedule an appointment as soon as possible for a visit in 2 weeks.   Specialty:  Family Medicine   Contact information:   [address] [place] [address] [phone]       The results of significant diagnostics from this hospitalization (including imaging, microbiology, ancillary and laboratory) are listed below for reference.    Significant Diagnostic Studies: Dg Chest 2 View  03/31/2016  CLINICAL DATA:  Chest pressure extending into the left arm at work today. History of TIA and gastroesophageal reflux disease. EXAM: CHEST  2 VIEW COMPARISON:  Radiographs 07/24/2013. FINDINGS: Suboptimal inspiration on the lateral view. The heart size and mediastinal contours are normal. The lungs are clear. There is no pleural effusion or pneumothorax. No acute osseous findings are identified. IMPRESSION: No active cardiopulmonary process Electronically Signed   By: Carey BullocksWilliam  Veazey M.D.   On: 03/31/2016 10:54    Microbiology: No results found for this or any previous visit (from the past 240 hour(s)).   Labs: Basic Metabolic Panel:  Recent Labs Lab 03/31/16 1032  03/31/16 1333  NA 141  --   K 4.3  --   CL 112*  --   CO2 20*  --   GLUCOSE 92  --   BUN 15  --   CREATININE 0.77 0.75  CALCIUM 9.4  --    Liver Function Tests: No results for input(s): AST, ALT, ALKPHOS, BILITOT, PROT, ALBUMIN in the last 168 hours. No results for input(s): LIPASE, AMYLASE in the last 168 hours. No results for input(s): AMMONIA in the last 168 hours. CBC:  Recent Labs Lab 03/31/16 1032 03/31/16 1333  WBC 5.9 6.6  HGB 14.3 13.6  HCT 41.1 40.4  MCV 89.5 89.4  PLT 145* 197   Cardiac Enzymes:  Recent Labs Lab 03/31/16 1333 03/31/16 1629  TROPONINI <0.03 <0.03   BNP: BNP (last 3 results) No results for input(s): BNP in the last 8760 hours.  ProBNP (  last 3 results) No results for input(s): PROBNP in the last 8760 hours.  CBG: No results for input(s): GLUCAP in the last 168 hours.     SignedPamella Pert  [facility] 04/01/2016, 3:29 PM

## 2016-04-01 NOTE — Discharge Instructions (Signed)
Follow with  Duane Lopeoss, Alan, MD in 5-7 days  Please get a complete blood count and chemistry panel checked by your Primary MD at your next visit, and again as instructed by your Primary MD. Please get your medications reviewed and adjusted by your Primary MD.  Please request your Primary MD to go over all Hospital Tests and Procedure/Radiological results at the follow up, please get all Hospital records sent to your Prim MD by signing hospital release before you go home.  If you had Pneumonia of Lung problems at the Hospital: Please get a 2 view Chest X ray done in 6-8 weeks after hospital discharge or sooner if instructed by your Primary MD.  If you have Congestive Heart Failure: Please call your Cardiologist or Primary MD anytime you have any of the following symptoms:  1) 3 pound weight gain in 24 hours or 5 pounds in 1 week  2) shortness of breath, with or without a dry hacking cough  3) swelling in the hands, feet or stomach  4) if you have to sleep on extra pillows at night in order to breathe  Follow cardiac low salt diet and 1.5 lit/day fluid restriction.  If you have diabetes Accuchecks 4 times/day, Once in AM empty stomach and then before each meal. Log in all results and show them to your primary doctor at your next visit. If any glucose reading is under 80 or above 300 call your primary MD immediately.  If you have Seizure/Convulsions/Epilepsy: Please do not drive, operate heavy machinery, participate in activities at heights or participate in high speed sports until you have seen by Primary MD or a Neurologist and advised to do so again.  If you had Gastrointestinal Bleeding: Please ask your Primary MD to check a complete blood count within one week of discharge or at your next visit. Your endoscopic/colonoscopic biopsies that are pending at the time of discharge, will also need to followed by your Primary MD.  Get Medicines reviewed and adjusted. Please take all your  medications with you for your next visit with your Primary MD  Please request your Primary MD to go over all hospital tests and procedure/radiological results at the follow up, please ask your Primary MD to get all Hospital records sent to his/her office.  If you experience worsening of your admission symptoms, develop shortness of breath, life threatening emergency, suicidal or homicidal thoughts you must seek medical attention immediately by calling 911 or calling your MD immediately  if symptoms less severe.  You must read complete instructions/literature along with all the possible adverse reactions/side effects for all the Medicines you take and that have been prescribed to you. Take any new Medicines after you have completely understood and accpet all the possible adverse reactions/side effects.   Do not drive or operate heavy machinery when taking Pain medications.   Do not take more than prescribed Pain, Sleep and Anxiety Medications  Special Instructions: If you have smoked or chewed Tobacco  in the last 2 yrs please stop smoking, stop any regular Alcohol  and or any Recreational drug use.  Wear Seat belts while driving.  Please note You were cared for by a hospitalist during your hospital stay. If you have any questions about your discharge medications or the care you received while you were in the hospital after you are discharged, you can call the unit and asked to speak with the hospitalist on call if the hospitalist that took care of you is not available.  Once you are discharged, your primary care physician will handle any further medical issues. Please note that NO REFILLS for any discharge medications will be authorized once you are discharged, as it is imperative that you return to your primary care physician (or establish a relationship with a primary care physician if you do not have one) for your aftercare needs so that they can reassess your need for medications and monitor your  lab values.  You can reach the hospitalist office at phone [phone] or fax [phone]   If you do not have a primary care physician, you can call [phone] for a physician referral.  Activity: As tolerated with Full fall precautions use Panik/cane & assistance as needed  Diet: heart healthy  Disposition Home

## 2016-04-01 NOTE — Interval H&P Note (Signed)
History and Physical Interval Note:  04/01/2016 2:21 PM  Alexis Frye  has presented today for surgery, with the diagnosis of unstable angina  The various methods of treatment have been discussed with the patient and family. After consideration of risks, benefits and other options for treatment, the patient has consented to  Procedure(s): Left Heart Cath and Coronary Angiography (N/A) as a surgical intervention .  The patient's history has been reviewed, patient examined, no change in status, stable for surgery.  I have reviewed the patient's chart and labs.  Questions were answered to the patient's satisfaction.    Cath Lab Visit (complete for each Cath Lab visit)  Clinical Evaluation Leading to the Procedure:   ACS: Yes.    Non-ACS:    Anginal Classification: CCS IV  Anti-ischemic medical therapy: No Therapy  Non-Invasive Test Results: No non-invasive testing performed  Prior CABG: No previous CABG       Theron Aristaeter [facility] Surgery Hospital - NorthwestJordanMD,FACC 04/01/2016 2:21 PM

## 2016-04-02 ENCOUNTER — Encounter (HOSPITAL_COMMUNITY): Payer: Self-pay | Admitting: Cardiology

## 2016-04-20 DIAGNOSIS — I10 Essential (primary) hypertension: Secondary | ICD-10-CM | POA: Insufficient documentation

## 2016-04-28 ENCOUNTER — Telehealth: Payer: Self-pay | Admitting: *Deleted

## 2016-04-28 ENCOUNTER — Ambulatory Visit: Payer: 59 | Admitting: Neurology

## 2016-04-28 NOTE — Telephone Encounter (Signed)
No showed follow up appointment. 

## 2016-04-29 ENCOUNTER — Encounter: Payer: Self-pay | Admitting: Neurology

## 2016-08-21 ENCOUNTER — Encounter (HOSPITAL_COMMUNITY): Payer: Self-pay | Admitting: Emergency Medicine

## 2016-08-21 ENCOUNTER — Emergency Department (HOSPITAL_COMMUNITY): Payer: BLUE CROSS/BLUE SHIELD

## 2016-08-21 ENCOUNTER — Emergency Department (HOSPITAL_COMMUNITY)
Admission: EM | Admit: 2016-08-21 | Discharge: 2016-08-21 | Disposition: A | Discharge status: Home / Self Care | Payer: BLUE CROSS/BLUE SHIELD | Attending: Emergency Medicine | Admitting: Emergency Medicine

## 2016-08-21 DIAGNOSIS — Z79899 Other long term (current) drug therapy: Secondary | ICD-10-CM | POA: Insufficient documentation

## 2016-08-21 DIAGNOSIS — R791 Abnormal coagulation profile: Secondary | ICD-10-CM | POA: Diagnosis not present

## 2016-08-21 DIAGNOSIS — I10 Essential (primary) hypertension: Secondary | ICD-10-CM | POA: Diagnosis not present

## 2016-08-21 DIAGNOSIS — Z7982 Long term (current) use of aspirin: Secondary | ICD-10-CM | POA: Diagnosis not present

## 2016-08-21 DIAGNOSIS — Z8673 Personal history of transient ischemic attack (TIA), and cerebral infarction without residual deficits: Secondary | ICD-10-CM | POA: Insufficient documentation

## 2016-08-21 DIAGNOSIS — H538 Other visual disturbances: Secondary | ICD-10-CM | POA: Insufficient documentation

## 2016-08-21 DIAGNOSIS — H539 Unspecified visual disturbance: Secondary | ICD-10-CM

## 2016-08-21 LAB — COMPREHENSIVE METABOLIC PANEL
ALT: 23 U/L (ref 14–54)
AST: 22 U/L (ref 15–41)
Albumin: 4.3 g/dL (ref 3.5–5.0)
Alkaline Phosphatase: 85 U/L (ref 38–126)
Anion gap: 8 (ref 5–15)
BILIRUBIN TOTAL: 0.9 mg/dL (ref 0.3–1.2)
BUN: 16 mg/dL (ref 6–20)
CALCIUM: 9.7 mg/dL (ref 8.9–10.3)
CHLORIDE: 109 mmol/L (ref 101–111)
CO2: 23 mmol/L (ref 22–32)
CREATININE: 0.79 mg/dL (ref 0.44–1.00)
Glucose, Bld: 96 mg/dL (ref 65–99)
Potassium: 4.6 mmol/L (ref 3.5–5.1)
Sodium: 140 mmol/L (ref 135–145)
TOTAL PROTEIN: 6.6 g/dL (ref 6.5–8.1)

## 2016-08-21 LAB — CBC
HEMATOCRIT: 43.4 % (ref 36.0–46.0)
Hemoglobin: 14.7 g/dL (ref 12.0–15.0)
MCH: 31.2 pg (ref 26.0–34.0)
MCHC: 33.9 g/dL (ref 30.0–36.0)
MCV: 92.1 fL (ref 78.0–100.0)
Platelets: 194 10*3/uL (ref 150–400)
RBC: 4.71 MIL/uL (ref 3.87–5.11)
RDW: 12.8 % (ref 11.5–15.5)
WBC: 6.1 10*3/uL (ref 4.0–10.5)

## 2016-08-21 LAB — DIFFERENTIAL
BASOS PCT: 1 %
Basophils Absolute: 0 10*3/uL (ref 0.0–0.1)
Eosinophils Absolute: 0.1 10*3/uL (ref 0.0–0.7)
Eosinophils Relative: 1 %
Lymphocytes Relative: 27 %
Lymphs Abs: 1.6 10*3/uL (ref 0.7–4.0)
MONO ABS: 0.4 10*3/uL (ref 0.1–1.0)
MONOS PCT: 7 %
NEUTROS ABS: 4 10*3/uL (ref 1.7–7.7)
Neutrophils Relative %: 64 %

## 2016-08-21 LAB — URINALYSIS, ROUTINE W REFLEX MICROSCOPIC
Bilirubin Urine: NEGATIVE
Glucose, UA: NEGATIVE mg/dL
Hgb urine dipstick: NEGATIVE
KETONES UR: NEGATIVE mg/dL
LEUKOCYTES UA: NEGATIVE
NITRITE: NEGATIVE
PROTEIN: NEGATIVE mg/dL
Specific Gravity, Urine: 1.01 (ref 1.005–1.030)
pH: 7 (ref 5.0–8.0)

## 2016-08-21 LAB — I-STAT TROPONIN, ED: TROPONIN I, POC: 0 ng/mL (ref 0.00–0.08)

## 2016-08-21 LAB — PROTIME-INR
INR: 1
Prothrombin Time: 13.3 seconds (ref 11.4–15.2)

## 2016-08-21 LAB — APTT: aPTT: 31 seconds (ref 24–36)

## 2016-08-21 NOTE — ED Notes (Signed)
Discharge vitals in and awaiting nurse for paperwork.

## 2016-08-21 NOTE — ED Notes (Signed)
Pt taken to MRI  

## 2016-08-21 NOTE — ED Provider Notes (Signed)
[facility] DEPT Provider Note   CSN: [id number] Arrival date & time: 08/21/16  0749     History   Chief Complaint Chief Complaint  Patient presents with  . Blurred Vision    HPI STEPHANIEANN POPESCU is a 65 y.o. female.  HPI Patient presents with acute onset visual changes starting at 6 AM this morning. She describes her vision as wavy. Was not sure whether this was affecting one or both eyes. This is since resolved. She now describes blurred vision in her periphery of the left eye. Denies any speech changes. No focal weakness or numbness. No eye pain or redness. Patient with prior TIA 6 years ago with right-sided weakness. Patient has chronic headaches with no change from her baseline. Past Medical History:  Diagnosis Date  . Arm pain left  . Arthritis   . GERD (gastroesophageal reflux disease)   . Headache   . Hyperlipemia   . PONV (postoperative nausea and vomiting)   . Seasonal allergies   . TIA (transient ischemic attack) 2012   no residual  . Wears glasses     Patient Active Problem List   Diagnosis Date Noted  . Essential hypertension   . Pain in the chest   . Chest pain 03/31/2016  . White coat hypertension 03/31/2016  . HLD (hyperlipidemia) 03/31/2016  . Musculoskeletal pain 03/31/2016  . GERD (gastroesophageal reflux disease) 03/31/2016  . Occipital headache 10/29/2015    Past Surgical History:  Procedure Laterality Date  . ABDOMINAL HYSTERECTOMY    . CARDIAC CATHETERIZATION N/A 04/01/2016   Procedure: Left Heart Cath and Coronary Angiography;  Surgeon: Peter M Swaziland, MD;  Location: [facility] INVASIVE CV LAB;  Service: Cardiovascular;  Laterality: N/A;  . CESAREAN SECTION     x2  . CHOLECYSTECTOMY    . COLONOSCOPY    . DIAGNOSTIC LAPAROSCOPY    . DILATION AND CURETTAGE OF UTERUS    . LARYNGOSCOPY     vocal cord polyps  . SHOULDER ARTHROSCOPY WITH ROTATOR CUFF REPAIR Left 11/26/2014   Procedure: LEFT SHOULDER ARTHROSCOPY W/ SAD/DCR (no cuff repair);   Surgeon: Nestor Lewandowsky, MD;  Location: [facility];  Service: Orthopedics;  Laterality: Left;  . TONSILLECTOMY      OB History    No data available       Home Medications    Prior to Admission medications   Medication Sig Start Date End Date Taking? Authorizing Provider  aspirin 81 MG tablet Take 81 mg by mouth daily.   Yes Historical Provider, MD  atorvastatin (LIPITOR) 20 MG tablet Take 20 mg by mouth daily.   Yes Historical Provider, MD  cholecalciferol (VITAMIN D) 1000 UNITS tablet Take 1,000 Units by mouth daily.   Yes Historical Provider, MD  diphenhydramine-acetaminophen (TYLENOL PM) 25-500 MG TABS tablet Take 1 tablet by mouth at bedtime as needed (sleep).   Yes Historical Provider, MD  loratadine (CLARITIN) 10 MG tablet Take 10 mg by mouth daily.   Yes Historical Provider, MD  meloxicam (MOBIC) 7.5 MG tablet Take 15 mg by mouth daily.    Yes Historical Provider, MD  omeprazole (PRILOSEC) 20 MG capsule Take 20 mg by mouth daily.   Yes Historical Provider, MD  nortriptyline (PAMELOR) 10 MG capsule 1 tablet every night for one week then 2 tablets every night Patient not taking: Reported on 03/31/2016 11/26/15   Levert Feinstein, MD  tiZANidine (ZANAFLEX) 2 MG tablet Take 1 tablet (2 mg total) by mouth 3 (three) times daily.  Patient not taking: Reported on 08/21/2016 08/29/15   Levert Feinstein, MD    Family History Family History  Problem Relation Age of Onset  . Emphysema Mother   . Heart disease Mother   . Osteoporosis Mother   . Alcoholism Father   . Heart attack Sister 60    smoker  . Heart attack Sister 10    smoker    Social History Social History  Substance Use Topics  . Smoking status: Never Smoker  . Smokeless tobacco: Never Used  . Alcohol use 0.0 oz/week     Comment: Occasional wine     Allergies   Codeine   Review of Systems Review of Systems  Constitutional: Negative for chills, fatigue and fever.  HENT: Negative for facial swelling.   Eyes:  Positive for visual disturbance. Negative for photophobia, pain, discharge and redness.  Respiratory: Negative for chest tightness and shortness of breath.   Cardiovascular: Negative for chest pain, palpitations and leg swelling.  Gastrointestinal: Negative for abdominal pain, constipation, diarrhea, nausea and vomiting.  Genitourinary: Negative for dysuria, flank pain and frequency.  Musculoskeletal: Negative for back pain, gait problem, myalgias, neck pain and neck stiffness.  Neurological: Positive for headaches. Negative for dizziness, speech difficulty, weakness, light-headedness and numbness.  Psychiatric/Behavioral: The patient is not nervous/anxious.   All other systems reviewed and are negative.    Physical Exam Updated Vital Signs BP 147/75   Pulse 72   Temp 98.2 F (36.8 C)   Resp 18   Ht 5\' 3"  (1.6 m)   Wt 190 lb (86.2 kg)   SpO2 95%   BMI 33.66 kg/m   Physical Exam  Constitutional: She is oriented to person, place, and time. She appears well-developed and well-nourished. No distress.  HENT:  Head: Normocephalic and atraumatic.  Mouth/Throat: Oropharynx is clear and moist.  Eyes: Conjunctivae and EOM are normal. Pupils are equal, round, and reactive to light. Right eye exhibits no discharge. Left eye exhibits no discharge.  Visual fields by confrontation testing intact  Neck: Normal range of motion. Neck supple.  Cardiovascular: Normal rate and regular rhythm.  Exam reveals no gallop and no friction rub.   No murmur heard. Pulmonary/Chest: Effort normal and breath sounds normal. No respiratory distress. She has no wheezes. She has no rales. She exhibits no tenderness.  Abdominal: Soft. Bowel sounds are normal. There is no tenderness. There is no rebound and no guarding.  Musculoskeletal: Normal range of motion. She exhibits no edema or tenderness.  Neurological: She is alert and oriented to person, place, and time.  Patient is alert and oriented x3 with clear, goal  oriented speech. Patient has 5/5 motor in all extremities. Sensation is intact to light touch. Bilateral finger-to-nose is normal with no signs of dysmetria. Patient has a normal gait and walks without assistance.  Skin: Skin is warm and dry. Capillary refill takes less than 2 seconds. No rash noted. No erythema.  Psychiatric: She has a normal mood and affect. Her behavior is normal.  Nursing note and vitals reviewed.    ED Treatments / Results  Labs (all labs ordered are listed, but only abnormal results are displayed) Labs Reviewed  PROTIME-INR  APTT  CBC  DIFFERENTIAL  COMPREHENSIVE METABOLIC PANEL  URINALYSIS, ROUTINE W REFLEX MICROSCOPIC (NOT AT [facility])  Rosezena Sensor, ED    EKG  EKG Interpretation  Date/Time:  Friday August 21 2016 08:04:50 EDT Ventricular Rate:  70 PR Interval:    QRS Duration: 97 QT Interval:  399 QTC Calculation: 431 R Axis:   -22 Text Interpretation:  Sinus rhythm Low voltage, precordial leads Abnormal R-wave progression, early transition Probable left ventricular hypertrophy Confirmed by Ranae PalmsYELVERTON  MD, Lilie Vezina ([id number]) on 08/21/2016 8:42:01 AM       Radiology Dg Lumbar Spine 2-3 Views  Result Date: 08/21/2016 CLINICAL DATA:  Evaluate for foreign body prior to MRI. EXAM: LUMBAR SPINE - 2-3 VIEW COMPARISON:  07/24/2013 abdominal radiograph. FINDINGS: There is a radiodense linear 3.9 x 0.6 cm foreign body in the superficial left back soft tissues at the L2-3 level. Cholecystectomy clips are seen in the right upper quadrant of the abdomen. This report assumes 5 non rib-bearing lumbar vertebrae. Lumbar vertebral body heights are preserved, with no fracture.Mild degenerative disc disease at L1-2, L3-4 and L5-S1. No spondylolisthesis. Mild facet arthropathy bilaterally in the lower lumbar spine. No aggressive appearing focal osseous lesions. IMPRESSION: 1. Radiodense linear 3.9 x 0.6 cm foreign body in the superficial left back soft tissues at the L2-3 level.  2. 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 Electronically Signed   By: Delbert PhenixJason A Poff M.D.   On: 08/21/2016 13:12   Ct Head Wo Contrast  Result Date: 08/21/2016 CLINICAL DATA:  Blurred vision, history of TIA EXAM: CT HEAD WITHOUT CONTRAST TECHNIQUE: Contiguous axial images were obtained from the base of the skull through the vertex without intravenous contrast. COMPARISON:  MR brain dated 09/25/2015 FINDINGS: Brain: No evidence of acute infarction, hemorrhage, hydrocephalus, extra-axial collection or mass lesion/mass effect. Vascular: No hyperdense vessel or unexpected calcification. Skull: No evidence of calvarial fracture. Sinuses/Orbits: The visualized paranasal sinuses are essentially clear. The mastoid air cells are unopacified. Other: Cerebral volume is within normal limits. No ventriculomegaly. IMPRESSION: Normal head CT. Electronically Signed   By: Charline BillsSriyesh  Krishnan M.D.   On: 08/21/2016 09:19   Mr Brain Wo Contrast  Result Date: 08/21/2016 CLINICAL DATA:  65 y/o F; episode of blurred vision at 0430 hours while reading. EXAM: MRI HEAD WITHOUT CONTRAST TECHNIQUE: Multiplanar, multiecho pulse sequences of the brain and surrounding structures were obtained without intravenous contrast. COMPARISON:  08/21/2016 CT head.  09/25/2015 MRI brain. FINDINGS: Brain: No acute infarction, hemorrhage, hydrocephalus, extra-axial collection or mass lesion. Few stable nonspecific foci of T2 FLAIR hyperintensity in subcortical white matter probably represent minimal chronic microvascular ischemic changes. Vascular: Normal flow voids. Skull and upper cervical spine: Normal marrow signal. Sinuses/Orbits: Mucous retention cysts within the maxillary sinuses bilaterally and mild diffuse paranasal sinus mucosal thickening. No abnormal signal of mastoid air cells. Negative orbits. Other: None. IMPRESSION: 1. No acute intracranial abnormality is identified. 2. Mild paranasal sinus disease. Stable minimal  chronic microvascular ischemic changes. Electronically Signed   By: Mitzi HansenLance  Furusawa-Stratton M.D.   On: 08/21/2016 13:56    Procedures Procedures (including critical care time)  Medications Ordered in ED Medications - No data to display   Initial Impression / Assessment and Plan / ED Course  I have reviewed the triage vital signs and the nursing notes.  Pertinent labs & imaging results that were available during my care of the patient were reviewed by me and considered in my medical decision making (see chart for details).  Clinical Course   Patient with improving left-sided peripheral vision change. No other neurologic deficit. Discussed with Dr. Amada JupiterKirkpatrick. Since visual changes may be related to the patient's headaches. If MRI normal she can be discharged home to follow-up with neurology as an outpatient.  Patient is currently symptom-free. MRI with paranasal sinus  disease but no acute abdomen around to the brain. Patient does have a neurologist which she is seen in the past for headaches. Have referred her back to her neurologist. Return precautions have been given. Final Clinical Impressions(s) / ED Diagnoses   Final diagnoses:  Visual changes  Visual disturbance    New Prescriptions New Prescriptions   No medications on file     Loren Racer, MD 08/21/16 1415

## 2016-08-21 NOTE — ED Notes (Signed)
Pt comfortable with discharge and follow up instructions. Pt declines wheelchair, escorted to waiting area by this RN. Rx x0 

## 2016-08-21 NOTE — ED Triage Notes (Signed)
C/o blurry vision in left eye started at 0615-- wavy type vision -- hx tia 6 yrs ago. Denies numbness at present

## 2016-08-21 NOTE — ED Triage Notes (Signed)
Pt in from home after episode of blurred vision starting at 0430 while reading. States the blurriness occurred in both eyes. Hx of TIA 3879yrs ago. Currently denies blurred visions, n/v, weakness or numbness. A&ox4.

## 2016-10-15 ENCOUNTER — Ambulatory Visit (INDEPENDENT_AMBULATORY_CARE_PROVIDER_SITE_OTHER): Payer: BLUE CROSS/BLUE SHIELD | Admitting: Neurology

## 2016-10-15 ENCOUNTER — Encounter: Payer: Self-pay | Admitting: Neurology

## 2016-10-15 VITALS — BP 112/70 | HR 66 | Ht 63.0 in | Wt 189.5 lb

## 2016-10-15 DIAGNOSIS — R51 Headache: Secondary | ICD-10-CM | POA: Diagnosis not present

## 2016-10-15 DIAGNOSIS — R519 Headache, unspecified: Secondary | ICD-10-CM

## 2016-10-15 DIAGNOSIS — G43109 Migraine with aura, not intractable, without status migrainosus: Secondary | ICD-10-CM | POA: Diagnosis not present

## 2016-10-15 DIAGNOSIS — G4730 Sleep apnea, unspecified: Secondary | ICD-10-CM | POA: Insufficient documentation

## 2016-10-15 MED ORDER — TOPIRAMATE 50 MG PO TABS: 50.0000 mg | ORAL_TABLET | Freq: Two times a day (BID) | ORAL | 11 refills | Status: DC

## 2016-10-15 NOTE — Progress Notes (Signed)
Chief Complaint  Patient presents with  . Headache    Reports an increase in headache frequency.  She is having some type of headache daily.  She always has light sensitivity and sometimes blurred vision. She had a repeat brain MRI on 08/21/16 that was not concerning for a specific cause.  She stopped nortriptyline due to it causing light-headedness.  She is now using tizanidine 2mg , BID.      PATIENT: Alexis Frye DOB: 05-Dec-1951  Chief Complaint  Patient presents with  . Headache    Reports an increase in headache frequency.  She is having some type of headache daily.  She always has light sensitivity and sometimes blurred vision. She had a repeat brain MRI on 08/21/16 that was not concerning for a specific cause.  She stopped nortriptyline due to it causing light-headedness.  She is now using tizanidine 2mg , BID.     HISTORICAL  Alexis Frye is a 65 years old right-handed female, seen in refer by  his primary care physician Dr. Gildardo Cranker for evaluation of headaches, left occipital area pain   She reported headache since 65 years old, always located at left occipital region, spreading forward, she described constant pressure pain, initially she responded to left occipital nerve block, eventually was evaluated by [facility] around 2000, had left occipital area neurostimulator placement, but was not sure about the benefit, was removed in 2015.  She is now have constant occipital area headaches, pressure pain, 4 out of 10, spreading forward sometimes, become more severe 10 out of 10 headache, usually triggered by stress, relieved by sleeping dark quiet room, during her headaches, she has nausea, often made worse by Tylenol  She denied visual loss, no lateralized motor or sensory deficit, complains of a lot of work-related stress.  She was treated with topiramate since 2015 without help   She also reported a history of abnormal brain scan in the past, but could not elaborate on  detail, she is worried about the changes, wants to have repeat brain scan.  UPDATE Oct 29 2015: She still has left occipital area tenderness, getting worse if she turning her head, had a catch at her left occipital area, her headache has much improved with tizanidine 2 mg 2-3 times a day, she complains mild drowsiness with medications  UPDATE Nov 26 2015: She continue frequent bilateral upper nuchal area pain, spreading forward to become occipital area headaches, left worse than right, she denies arm or leg weakness.  She has occasionally light sensitivity, denies nausea or vomiting.  UPDATE Oct 15 2016: She had one ocular migraine in Sept 2017, she had sudden onset blurry vision, flashing light last for 15 minutes, but she has no significant headaches.  She has headaches almost all the time. She had cervical stimulator for her headache without helping her symptoms, she denies significant improvement with previous left occipital nerve block  We have personally reviewed MRI of brain in September 2017, there was no significant abnormality noticed  Laboratory evaluation in September showed negative troponin, normal CBC, CMP, she presented to the hospital for chest pain, radiating to left arm, was diagnosed with undigestion, no cardiac etiology found She also complains frequent awakening at nighttime, difficulty sleeping, daytime fatigue  REVIEW OF SYSTEMS: Full 14 system review of systems performed and notable only for headaches  ALLERGIES: Allergies  Allergen Reactions  . Codeine Other (See Comments)    unk reaction.     HOME MEDICATIONS: Current Outpatient Prescriptions  Medication Sig Dispense Refill  . aspirin 81 MG tablet Take 81 mg by mouth daily.    Marland Kitchen. atorvastatin (LIPITOR) 20 MG tablet Take 20 mg by mouth daily.    . cholecalciferol (VITAMIN D) 1000 UNITS tablet Take 1,000 Units by mouth daily.    . diphenhydramine-acetaminophen (TYLENOL PM) 25-500 MG TABS tablet Take 1 tablet by  mouth at bedtime as needed (sleep).    . loratadine (CLARITIN) 10 MG tablet Take 10 mg by mouth daily.    . meloxicam (MOBIC) 7.5 MG tablet Take 15 mg by mouth daily.     Marland Kitchen. omeprazole (PRILOSEC) 20 MG capsule Take 20 mg by mouth daily.    Marland Kitchen. tiZANidine (ZANAFLEX) 2 MG tablet Take 1 tablet (2 mg total) by mouth 3 (three) times daily. 90 tablet 6   No current facility-administered medications for this visit.     PAST MEDICAL HISTORY: Past Medical History:  Diagnosis Date  . Arm pain left  . Arthritis   . GERD (gastroesophageal reflux disease)   . Headache   . Hyperlipemia   . PONV (postoperative nausea and vomiting)   . Seasonal allergies   . TIA (transient ischemic attack) 2012   no residual  . Wears glasses     PAST SURGICAL HISTORY: Past Surgical History:  Procedure Laterality Date  . ABDOMINAL HYSTERECTOMY    . CARDIAC CATHETERIZATION N/A 04/01/2016   Procedure: Left Heart Cath and Coronary Angiography;  Surgeon: Peter M SwazilandJordan, MD;  [facility];  Service: Cardiovascular;  Laterality: N/A;  . CESAREAN SECTION     x2  . CHOLECYSTECTOMY    . COLONOSCOPY    . DIAGNOSTIC LAPAROSCOPY    . DILATION AND CURETTAGE OF UTERUS    . LARYNGOSCOPY     vocal cord polyps  . SHOULDER ARTHROSCOPY WITH ROTATOR CUFF REPAIR Left 11/26/2014   Procedure: LEFT SHOULDER ARTHROSCOPY W/ SAD/DCR (no cuff repair);  Surgeon: Nestor LewandowskyFrank J Rowan, MD;  Location: [facility];  Service: Orthopedics;  Laterality: Left;  . TONSILLECTOMY      FAMILY HISTORY: Family History  Problem Relation Age of Onset  . Emphysema Mother   . Heart disease Mother   . Osteoporosis Mother   . Alcoholism Father   . Heart attack Sister 4055    smoker  . Heart attack Sister 7559    smoker    SOCIAL HISTORY:  Social History   Social History  . Marital status: Married    Spouse name: N/A  . Number of children: 2  . Years of education: 16   Occupational History  . Copywriter, advertisingystems Director of  [employer]   Social History Main Topics  . Smoking status: Never Smoker  . Smokeless tobacco: Never Used  . Alcohol use 0.0 oz/week     Comment: Occasional wine  . Drug use: No  . Sexual activity: Not on file   Other Topics Concern  . Not on file   Social History Narrative   Lives at home with her husband and mother-in-law.   Right-handed.   2 cups caffeine per day.     PHYSICAL EXAM   Vitals:   10/15/16 0720  BP: 112/70  Pulse: 66  Weight: 189 lb 8 oz (86 kg)  Height: 5\' 3"  (1.6 m)    Not recorded      Body mass index is 33.57 kg/m.  PHYSICAL EXAMNIATION:  Gen: NAD, conversant, well nourised, obese, well groomed  Cardiovascular: Regular rate rhythm, no peripheral edema, warm, nontender. Eyes: Conjunctivae clear without exudates or hemorrhage Neck: Supple, no carotid bruise. Pulmonary: Clear to auscultation bilaterally  Musculoskeletal: Tenderness of left nuchal line at the exit of left great occipital nerve, well-healed upper cervical scar  NEUROLOGICAL EXAM:  MENTAL STATUS: Speech:    Speech is normal; fluent and spontaneous with normal comprehension.  Cognition:     Orientation to time, place and person     Normal recent and remote memory     Normal Attention span and concentration     Normal Language, naming, repeating,spontaneous speech     Fund of knowledge   CRANIAL NERVES: CN II: Visual fields are full to confrontation. Fundoscopic exam is normal with sharp discs and no vascular changes. Pupils are round equal and briskly reactive to light. CN III, IV, VI: extraocular movement are normal. No ptosis. CN V: Facial sensation is intact to pinprick in all 3 divisions bilaterally. Corneal responses are intact.  CN VII: Face is symmetric with normal eye closure and smile. CN VIII: Hearing is normal to rubbing fingers CN IX, X: Palate elevates symmetrically. Phonation is normal. CN XI: Head turning and  shoulder shrug are intact CN XII: Tongue is midline with normal movements and no atrophy. Narrow oropharyngeal  MOTOR: There is no pronator drift of out-stretched arms. Muscle bulk and tone are normal. Muscle strength is normal.  REFLEXES: Reflexes are 2+ and symmetric at the biceps, triceps, knees, and ankles. Plantar responses are flexor.  SENSORY: Intact to light touch, pinprick, position sense, and vibration sense are intact in fingers and toes.  COORDINATION: Rapid alternating movements and fine finger movements are intact. There is no dysmetria on finger-to-nose and heel-knee-shin.    GAIT/STANCE: Posture is normal. Gait is steady with normal steps, base, arm swing, and turning. Heel and toe walking are normal. Tandem gait is normal.  Romberg is absent.   DIAGNOSTIC DATA (LABS, IMAGING, TESTING) - I reviewed patient records, labs, notes, testing and imaging myself where available.   ASSESSMENT AND PLAN  Rod HollerVanessa J Davison is a 65 y.o. 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 Ph.D.  [facility] [address], [address] [place], [place] [address] Ph: [phone] Fax: [phone]  ZH:[id number]:Charles Tenny Crawoss, MD

## 2016-10-15 NOTE — Progress Notes (Signed)
1 cc of 0.5 percent Marcaine and was mixed with 1 cc of betamethasone, 6 mg/1 cc  Injection was placed at the exit point of left great occipital nerve, and along the left nuchal line  She tolerated the injection well,  I have advised her hot compression, as needed NSAIDs, deep tissue massage, tizanidine as needed

## 2016-11-16 ENCOUNTER — Encounter: Payer: Self-pay | Admitting: Neurology

## 2016-11-16 ENCOUNTER — Ambulatory Visit (INDEPENDENT_AMBULATORY_CARE_PROVIDER_SITE_OTHER): Payer: BLUE CROSS/BLUE SHIELD | Admitting: Neurology

## 2016-11-16 VITALS — BP 176/87 | HR 69 | Resp 20 | Ht 63.0 in | Wt 190.0 lb

## 2016-11-16 DIAGNOSIS — G4733 Obstructive sleep apnea (adult) (pediatric): Secondary | ICD-10-CM

## 2016-11-16 DIAGNOSIS — R0683 Snoring: Secondary | ICD-10-CM | POA: Diagnosis not present

## 2016-11-16 NOTE — Progress Notes (Signed)
SLEEP MEDICINE CLINIC   Provider:  Melvyn Novas, M D  Referring Provider: Daisy Floro, MD Primary Care Physician:  [name], MD  Chief Complaint  Patient presents with  . New Patient (Initial Visit)    never had sleep study, snores    HPI:  Alexis Frye is a 65 y.o. female , seen here as a referra  from Dr. Terrace Arabia for a sleep evaluation,  Alexis Frye has suffered from headaches for many years beginning at age 83, following pregnancy ,and at the time restricted to a left occipital regional pain. She was evaluated at the [facility] pain clinic in the year 2000. She had a neurostimulator placed which was later removed in 2015. She has almost constant occipital area headaches that are characterized as a pressure. Usually about 4-5 out of 10 in intensity, sometimes spreading anterior to the frontal or temporal region. Usually triggered by stress they're relieved by staying in a dark room, resting, reducing external stimuli. She also reports that ice packs on the back of her head really help. She fail to topiramate, but her had pain improved with type tizanidine. She had mild drowsiness with the medication, which is not unexpected. In September 2017 and MRI of the brain was obtained through Dr. Terrace Arabia and there was no abnormality noted. She complains about frequent awakening at nighttime, difficulties initiating and maintaining sleep, and feeling fatigued and sleepy in daytime.  She has a history of left arm pain ,that was found not to be cardiac , osteoarthritis, gastroesophageal reflux, hyperlipidemia, seasonal allergies with rhinitis, a transient ischemic attack in the year 2012. She is postmenopausal.    Sleep habits are as follows: She goes to bed by 9 PM, rarely later. She falls asleep easily in the bedroom that she describes is cool, quiet and dark. Her loud snoring has caused her husband to sleep in another room. She sleeps on one pillow, and usually falls asleep on her side. She very  frequently wakes up at about 2-3 AM and then cannot go back to sleep. It is her urge to urinate that wakes her. Many mornings she will just stay awake at that time. Her average sleep time would be around 5 hours at night. She rises at the latest at 5 AM, but she called her in terminal alarm clock. She does not own an alarm clock. She states that even if she would go to bed later she still wakes up at this time and that her wake time is not dependent on her night sleep being fragmented or not. She always has only one bathroom break at night. She wakes up with a dry mouth ,sometimes with headaches but not usually.  Sleep medical history and family sleep history: Heart disease affects her mother and sisters both sisters are also smokers, have COPD. She grew up amongst alcoholics and  smokers.  Social history: She drinks hot tea 2 cups a day, no coffee, nor soda, she has never used tobacco products, she may drink a glass of wine every 2 month. Seldomly.  Review of Systems: Out of a complete 14 system review, the patient complains of only the following symptoms, and all other reviewed systems are negative.  Snoring, limited sleep  Epworth score 4 , Fatigue severity score 15 , depression score 5/15    Social History   Social History  . Marital status: Married    Spouse name: N/A  . Number of children: 2  . Years of education: 26  Occupational History  . Copywriter, advertisingystems Director of [employer]   Social History Main Topics  . Smoking status: Never Smoker  . Smokeless tobacco: Never Used  . Alcohol use 0.0 oz/week     Comment: Occasional wine  . Drug use: No  . Sexual activity: Not on file   Other Topics Concern  . Not on file   Social History Narrative   Lives at home with her husband and mother-in-law.   Right-handed.   2 cups caffeine per day.    Family History  Problem Relation Age of Onset  . Emphysema Mother   . Heart disease Mother   . Osteoporosis  Mother   . Alcoholism Father   . Heart attack Sister [id number]    smoker  . Heart attack Sister [id number]    smoker    Past Medical History:  Diagnosis Date  . Arm pain left  . Arthritis   . GERD (gastroesophageal reflux disease)   . Headache   . Hyperlipemia   . PONV (postoperative nausea and vomiting)   . Seasonal allergies   . TIA (transient ischemic attack) 2012   no residual  . Wears glasses     Past Surgical History:  Procedure Laterality Date  . ABDOMINAL HYSTERECTOMY    . CARDIAC CATHETERIZATION N/A 04/01/2016   Procedure: Left Heart Cath and Coronary Angiography;  Surgeon: Peter M SwazilandJordan, MD;  [facility];  Service: Cardiovascular;  Laterality: N/A;  . CESAREAN SECTION     x2  . CHOLECYSTECTOMY    . COLONOSCOPY    . DIAGNOSTIC LAPAROSCOPY    . DILATION AND CURETTAGE OF UTERUS    . LARYNGOSCOPY     vocal cord polyps  . SHOULDER ARTHROSCOPY WITH ROTATOR CUFF REPAIR Left 11/26/2014   Procedure: LEFT SHOULDER ARTHROSCOPY W/ SAD/DCR (no cuff repair);  Surgeon: Nestor LewandowskyFrank J Rowan, MD;  Location: [facility];  Service: Orthopedics;  Laterality: Left;  . TONSILLECTOMY      Current Outpatient Prescriptions  Medication Sig Dispense Refill  . aspirin 81 MG tablet Take 81 mg by mouth daily.    Marland Kitchen. atorvastatin (LIPITOR) 20 MG tablet Take 20 mg by mouth daily.    . cholecalciferol (VITAMIN D) 1000 UNITS tablet Take 1,000 Units by mouth daily.    . diphenhydramine-acetaminophen (TYLENOL PM) 25-500 MG TABS tablet Take 1 tablet by mouth at bedtime as needed (sleep).    . loratadine (CLARITIN) 10 MG tablet Take 10 mg by mouth daily.    Marland Kitchen. omeprazole (PRILOSEC) 20 MG capsule Take 20 mg by mouth daily.    Marland Kitchen. tiZANidine (ZANAFLEX) 2 MG tablet Take 1 tablet (2 mg total) by mouth 3 (three) times daily. 90 tablet 6  . topiramate (TOPAMAX) 50 MG tablet Take 1 tablet (50 mg total) by mouth 2 (two) times daily. (Patient not taking: Reported on 11/16/2016) 30 tablet 11   No  current facility-administered medications for this visit.     Allergies as of 11/16/2016 - Review Complete 11/16/2016  Allergen Reaction Noted  . Codeine Other (See Comments) 03/31/2016    Vitals: BP (!) 176/87   Pulse 69   Resp 20   Ht 5\' 3"  (1.6 m)   Wt 190 lb (86.2 kg)   BMI 33.66 kg/m  Last Weight:  Wt Readings from Last 1 Encounters:  11/16/16 190 lb (86.2 kg)   WUJ:WJXBBMI:Body mass index is 33.66 kg/m.     Last Height:  Ht Readings from Last 1 Encounters:  11/16/16 5\' 3"  (1.6 m)    Physical exam:  General: The patient is awake, alert and appears not in acute distress. The patient is well groomed. Head: Normocephalic, atraumatic. Neck is supple. Mallampati 2-3, crowded dental status lower jaw.  neck circumference:17. Nasal airflow restricted , Cardiovascular:  Regular rate and rhythm  without  murmurs or carotid bruit, and without distended neck veins. Respiratory: Lungs are clear to auscultation. Skin:  Without evidence of edema, or rash Trunk: BMI is 34- The patient's posture is erect.  Neurologic exam : The patient is awake and alert, oriented to place and time.   Memory subjective described as intact.  Attention span & concentration ability appears normal.  Speech is fluent,  without dysarthria, dysphonia or aphasia.  Mood and affect are appropriate.  Cranial nerves: Pupils are equal and briskly reactive to light. Funduscopic exam without evidence of pallor or edema.  Extraocular movements  in vertical and horizontal planes intact and without nystagmus. Visual fields by finger perimetry are intact. Hearing to finger rub intact.   Facial sensation intact to fine touch.  Facial motor strength is symmetric and tongue and uvula move midline. Shoulder shrug was symmetrical. Left shoulder mild crepitus.   Motor exam:  Normal tone, muscle bulk and symmetric strength in all extremities. Sensory:  Fine touch, pinprick and vibration were intact- Proprioception tested in the  upper extremities was normal. Coordination:  Finger-to-nose maneuver  normal without evidence of ataxia, dysmetria or tremor. Gait and station: Patient walks without assistive device and is able unassisted to climb up to the exam table. Strength within normal limits.  Stance is stable and normal.Tandem gait is unfragmented.  Deep tendon reflexes: in the upper and lower extremities are symmetric and intact.  The patient was advised of the nature of the diagnosed sleep disorder , the treatment options and risks for general a health and wellness arising from not treating the condition.  I spent more than 30 minutes of face to face time with the patient. Greater than 50% of time was spent in counseling and coordination of care. We have discussed the diagnosis and differential and I answered the patient's questions.    Dr. Terrace ArabiaYan stated that a preemptive metabolic panel, CBC were normal. She diagnosed her with visual auras typical for ocular migraine. The patient now takes tizanidine 2 mg. Meloxicam and topiramate were discontinued.  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 She has a larger than average neck circumference for woman. Her body mass index is elevated. Her past medical history includes a TIA, but over 5 years ago. Migraine headaches that appear not to be sleep related but can occur in daytime and sometimes stay with her for days. Occipital neuralgia.  2) her husband has witnessed her to snore but has not reported apneas. Her daytime fatigue and sleepiness are not very prominent today. For this reason I feel that a home sleep test as indicated.     Plan:  Treatment plan and additional workup : HST and RV after wards.        Porfirio Mylararmen Jennifermarie Franzen  MD  11/16/2016   CC: Daisy Floroharles Alan Ross, Md [address] [place], [place] [address]

## 2016-11-16 NOTE — Progress Notes (Signed)
I have reviewed and agreed above plan. 

## 2016-11-25 ENCOUNTER — Telehealth: Payer: Self-pay | Admitting: Neurology

## 2016-11-25 DIAGNOSIS — G4733 Obstructive sleep apnea (adult) (pediatric): Secondary | ICD-10-CM

## 2016-11-25 NOTE — Telephone Encounter (Signed)
Insurance insists on in lab study. Changed order.

## 2016-11-25 NOTE — Telephone Encounter (Signed)
This [employer] will not approve a HST.  Can I get an order for a split sleep study?

## 2016-12-08 ENCOUNTER — Ambulatory Visit (INDEPENDENT_AMBULATORY_CARE_PROVIDER_SITE_OTHER): Payer: BLUE CROSS/BLUE SHIELD | Admitting: Neurology

## 2016-12-08 DIAGNOSIS — G4733 Obstructive sleep apnea (adult) (pediatric): Secondary | ICD-10-CM | POA: Diagnosis not present

## 2016-12-11 NOTE — Procedures (Signed)
PATIENT'S NAME:  Alexis HollerReese, Phelan J. DOB:      Apr 15, 1951      MR#:    [id number]     DATE OF RECORDING: 12/08/2016 REFERRING M.D.:  Daisy Floroharles Alan Ross, MD, via Dr. Elisabeth PigeonYan  Study Performed:   Baseline Polysomnogram HISTORY:  Alexis Frye is a female patient of Dr. Zannie CoveYan's seen for a sleep evaluation. She suffered from headaches for many years beginning at age 65, following pregnancy, and at the time pain as restricted to the left occipital region. She was [facility] in the year 2000. She had a neuro-stimulator placed, which was later removed in 2015. She has almost constant occipital area headaches that are characterized as a pressure. Usually about 4-5 out of 10 in intensity, sometimes are spreading anterior to the frontal or temporal region. Usually triggered by stress ,they're relieved by staying in a dark room, resting, reducing external stimuli, ice packs on the back of her head help. She failed Topiramate, but her had pain improved with Tizanidine.  She complains about frequent awakening at nighttime, difficulties initiating and maintaining sleep, and feeling fatigued and sleepy in daytime. The patient endorsed the Epworth Sleepiness Scale at 4/24 points.  The patient's weight 190 pounds with a height of 63 (inches), resulting in a BMI of 33.6 kg/m2.The patient's neck circumference measured 17 inches.  CURRENT MEDICATIONS: Aspirin, Atorvastatin, Cholecalciferol, Tylenol PM, Loratadine, Omeprazole, Tizanidine and Topiramate   PROCEDURE:  This is a multichannel digital polysomnogram utilizing the Somnostar 11.2 system.  Electrodes and sensors were applied and monitored per AASM Specifications.   EEG, EOG, Chin and Limb EMG, were sampled at 200 Hz.  ECG, Snore and Nasal Pressure, Thermal Airflow, Respiratory Effort, CPAP Flow and Pressure, Oximetry was sampled at 50 Hz. Digital video and audio were recorded.      BASELINE STUDY Lights Out was at 22:13 and Lights On at 05:16.  Total  recording time (TRT) was 424 minutes, with a total sleep time (TST) of 381.5 minutes.   The patient's sleep latency was 7.5 minutes.  REM latency was 100 minutes.  The sleep efficiency was 90.0 %.     SLEEP ARCHITECTURE: WASO (Wake after sleep onset) was 35 minutes.  There were 17 minutes in Stage N1, 257 minutes Stage N2, 0 minutes Stage N3 and 107.5 minutes in Stage REM.  The percentage of Stage N1 was 4.5%, Stage N2 was 67.4%, Stage N3 was 0% and Stage R (REM sleep) was 28.2%.    RESPIRATORY ANALYSIS:  There were a total of 43 respiratory events:  3 obstructive apneas, 0 central apneas and 0 mixed apneas with a total of 3 apneas and an apnea index (AI) of .5 /hour. There were 40 hypopneas with a hypopnea index of 6.3 /hour. The patient also had 0 respiratory event related arousals (RERAs).     The total APNEA/HYPOPNEA INDEX (AHI) was 6.8/hour and the total RESPIRATORY DISTURBANCE INDEX was 6.8 /hour.  32 events occurred in REM sleep and 20 events in NREM. The REM AHI was 17.9 /hour, versus a non-REM AHI of 2.4. The patient spent 127 minutes of total sleep time in the supine position and 255 minutes in non-supine. The supine AHI was 10.4 versus a non-supine AHI of 5.0.  OXYGEN SATURATION & C02:  The Wake baseline 02 saturation was 92%, with the lowest being 83%. Time spent below 89% saturation equaled 16 minutes.   PERIODIC LIMB MOVEMENTS:   The patient had a total of  72 Periodic Limb Movements.  The Periodic Limb Movement (PLM) index was 11.3 and the PLM Arousal index was 3.1/hour.  The arousals were noted as: 52 were spontaneous, 20 were associated with PLMs, 42 were associated with respiratory events. Audio and video analysis did not show any abnormal or unusual movements, behaviors, phonations or vocalizations.   The patient took no bathroom breaks. Loud Snoring was noted, independent of sleep position. EKG was in keeping with normal sinus rhythm (NSR).  IMPRESSION:  1. Mild Obstructive  Sleep Apnea (OSA) AHI 6.8 with some REM accentuation (REM AHI 17.9) , but no significant hypoxemia.  2. LOUD snoring. 3. Periodic Limb Movement Disorder (PLMD) with 3.1 arousals per hour of sleep.   RECOMMENDATIONS: The mild degree of apnea is usually only addressed with CPAP if the patient reports excessive daytime sleepiness, which the patient does not endorse. No evidence of hypoxemia or hypercapnia, which can contribute to headaches.   1. There were frequent periodic limb movements of sleep (PLMS) with associated sleep disruption.  2.  Positional therapy is advised to reduce apnea, but supine position did not affect snoring. Dental device or ENT examination as clinically indicated if primary snoring is of clinical concern. 3. Avoid caffeine-containing beverages and chocolate. Avoid sedative-hypnotics which may worsen sleep apnea, alcohol and tobacco (as applicable). 4. Advise to lose weight, by diet and exercise if not contraindicated (BMI 33.6). 5. A follow up appointment can be scheduled in the [facility] Clinic at [facility]. The referring provider (Dr. Terrace ArabiaYan )  will be notified of the results.     I certify that I have reviewed the entire raw data recording prior to the issuance of this report in accordance with the Standards of Accreditation of the American Academy of Sleep Medicine (AASM)   Melvyn Novasarmen Amaree Leeper, MD  12-11-2016  Diplomat, American Board of Psychiatry and Neurology  Diplomat, American Board of Sleep Medicine Medical Director of [facility]

## 2016-12-17 ENCOUNTER — Telehealth: Payer: Self-pay

## 2016-12-17 NOTE — Telephone Encounter (Signed)
-----   Message from Melvyn Novasarmen Dohmeier, MD sent at 12/11/2016 12:32 PM EST ----- 1. Mild Obstructive Sleep Apnea (OSA) AHI 6.8 with some REM accentuation (REM AHI 17.9) , but no significant hypoxemia.  2. LOUD snoring. 3. Periodic Limb Movement Disorder (PLMD) with 3.1 arousals per hour of sleep.   RECOMMENDATIONS: The mild degree of apnea is usually only addressed with CPAP if the patient reports excessive daytime sleepiness, which the patient does not endorse. No evidence of hypoxemia or hypercapnia, which can contribute to headaches.   1. There were frequent periodic limb movements of sleep (PLMS) with associated sleep disruption.  2.  Positional therapy is advised to reduce apnea, but supine position did not affect snoring. Dental device or ENT examination as clinically indicated if primary snoring is of clinical concern. 3. Avoid caffeine-containing beverages and chocolate. Avoid sedative-hypnotics which may worsen sleep apnea, alcohol and tobacco (as applicable). 4. Advise to lose weight, by diet and exercise if not contraindicated (BMI 33.6). 5. A follow up appointment can be scheduled in the [facility] at [facility]. The referring provider (Dr. Terrace ArabiaYan )  will be notified of the results.

## 2016-12-17 NOTE — Telephone Encounter (Signed)
I called pt to discuss her sleep study results. No answer, left a message asking her to call me back. 

## 2016-12-22 NOTE — Telephone Encounter (Signed)
LM to call back.

## 2016-12-22 NOTE — Telephone Encounter (Signed)
Patient called back and is aware of results and recommendations. She was able to make f/u appt in February to discuss further.

## 2017-02-09 ENCOUNTER — Encounter: Payer: Self-pay | Admitting: Neurology

## 2017-02-09 ENCOUNTER — Ambulatory Visit (INDEPENDENT_AMBULATORY_CARE_PROVIDER_SITE_OTHER): Payer: BLUE CROSS/BLUE SHIELD | Admitting: Neurology

## 2017-02-09 VITALS — BP 110/52 | HR 60 | Resp 18 | Ht 63.0 in | Wt 188.0 lb

## 2017-02-09 DIAGNOSIS — R0683 Snoring: Secondary | ICD-10-CM | POA: Diagnosis not present

## 2017-02-09 DIAGNOSIS — G4733 Obstructive sleep apnea (adult) (pediatric): Secondary | ICD-10-CM | POA: Diagnosis not present

## 2017-02-09 NOTE — Patient Instructions (Signed)
Obesity, Adult Obesity is having too much body fat. If you have a BMI of 30 or more, you are obese. BMI is a number that explains how much body fat you have. Obesity is often caused by taking in (consuming) more calories than your body uses. Obesity can cause serious health problems. Changing your lifestyle can help to treat obesity. Follow these instructions at home: Eating and drinking    Follow advice from your doctor about what to eat and drink. Your doctor may tell you to:  Cut down on (limit) fast foods, sweets, and processed snack foods.  Choose low-fat options. For example, choose low-fat milk instead of whole milk.  Eat 5 or more servings of fruits or vegetables every day.  Eat at home more often. This gives you more control over what you eat.  Choose healthy foods when you eat out.  Learn what a healthy portion size is. A portion size is the amount of a certain food that is healthy for you to eat at one time. This is different for each person.  Keep low-fat snacks available.  Avoid sugary drinks. These include soda, fruit juice, iced tea that is sweetened with sugar, and flavored milk.  Eat a healthy breakfast.  Drink enough water to keep your pee (urine) clear or pale yellow.  Do not go without eating for long periods of time (do not fast).  Do not go on popular or trendy diets (fad diets). Physical Activity   Exercise often, as told by your doctor. Ask your doctor:  What types of exercise are safe for you.  How often you should exercise.  Warm up and stretch before being active.  Do slow stretching after being active (cool down).  Rest between times of being active. Lifestyle   Limit how much time you spend in front of your TV, computer, or video game system (be less sedentary).  Find ways to reward yourself that do not involve food.  Limit alcohol intake to no more than 1 drink a day for nonpregnant women and 2 drinks a day for men. One drink equals 12  oz of beer, 5 oz of wine, or 1 oz of hard liquor. General instructions   Keep a weight loss journal. This can help you keep track of:  The food that you eat.  The exercise that you do.  Take over-the-counter and prescription medicines only as told by your doctor.  Take vitamins and supplements only as told by your doctor.  Think about joining a support group. Your doctor may be able to help with this.  Keep all follow-up visits as told by your doctor. This is important. Contact a doctor if:  You cannot meet your weight loss goal after you have changed your diet and lifestyle for 6 weeks. This information is not intended to replace advice given to you by your health care provider. Make sure you discuss any questions you have with your health care provider. Document Released: 02/22/2012 Document Revised: 05/07/2016 Document Reviewed: 09/18/2015 Elsevier Interactive Patient Education  2017 [employer]vier Inc.  

## 2017-02-09 NOTE — Progress Notes (Signed)
SLEEP MEDICINE CLINIC   Provider:  Melvyn Novasarmen  Ronrico Dupin, M D  Referring Provider: Daisy Florooss, Charles Alan, MD Primary Care Physician:  Duane LopeAlan Ross, MD  Chief Complaint  Patient presents with  . Follow-up    Rm 10. Patient is here to discuss her sleep study. No new concerns.     HPI:  Alexis Frye is a 66 y.o. female , seen here as a in-house referral from Dr. Terrace ArabiaYan for a sleep evaluation,  Alexis Frye has suffered from headaches for many years beginning at age 66, following pregnancy ,and at the time restricted to a left occipital regional pain. She was [facility] in the year 2000. She had a neurostimulator placed which was later removed in 2015. She has almost constant occipital area headaches that are characterized as a pressure. Usually about 4-5 out of 10 in intensity, sometimes spreading anterior to the frontal or temporal region. Usually triggered by stress they're relieved by staying in a dark room, resting, reducing external stimuli. She also reports that ice packs on the back of her head really help. She failed to respond to topiramate, but her had pain improved with tizanidine. She had mild drowsiness with the medication, which is not unexpected.  In September 2017 and MRI of the brain was obtained through Dr. Terrace ArabiaYan and there was no abnormality noted. She complains about frequent awakening at nighttime, difficulties initiating and maintaining sleep, and feeling fatigued and sleepy in daytime. She has a history of left arm pain ,that was found not to be cardiac , osteoarthritis, gastroesophageal reflux, hyperlipidemia, seasonal allergies with rhinitis, a transient ischemic attack in the year 2012. She is postmenopausal.    Sleep habits are as follows: She goes to bed by 9 PM, rarely later. She falls asleep easily in the bedroom that she describes is cool, quiet and dark. Her loud snoring has caused her husband to sleep in another room. She sleeps on one pillow, and usually  falls asleep on her side. She very frequently wakes up at about 2-3 AM and then cannot go back to sleep. It is her urge to urinate that wakes her. Many mornings she will just stay awake at that time. Her average sleep time would be around 5 hours at night. She rises at the latest at 5 AM, but she called her in terminal alarm clock. She does not own an alarm clock.She states that even if she would go to bed later she still wakes up at this time and that her wake time is not dependent on her night sleep being fragmented or not. She always has only one bathroom break at night. She wakes up with a dry mouth ,sometimes with headaches but not usually.  Sleep medical history and family sleep history: Heart disease affects her mother and sisters both sisters are also smokers, have COPD. She grew up amongst alcoholics and  smokers.  Social history: She drinks hot tea 2 cups a day, no coffee, nor soda, she has never used tobacco products, she may drink a glass of wine every 2 month/ Seldomly.  Interval history from 02/09/2017, I have really visit with Alexis Frye today, patient of Dr. Zannie CoveYan's and Dr. Charlott Rakesoss's, who suffers from almost intractable occipital headaches. The patient was referred for baseline polysomnogram as she had risk factors for obstructive sleep apnea. The study was performed on 12/08/2016, Boxing Day, and revealed very mild obstructive sleep apnea which exacerbated during REM sleep periods there was also a strong  supine component and based on this I was hesitant to recommend CPAP therapy. It seems that the patient would do best with a dental device which could treats her loud snoring as well as the rather mild apnea. In addition weight loss and sleeping on the side avoiding supine position on the most cost effective and efficient ways of treating her kind of apnea. She does have frequent periodic limb movements and these cause some arousals from deeper sleep stages to lighter ones. She denies any  restless leg symptoms, and she is not aware of the movements. Her husband complains that time or 2. I would recommend to avoid caffeine containing beverages and chocolate after lunch, use a tennis ball to avoid sleeping in supine position. I would recommend a low carbohydrate diet to help with weight loss. Should snoring still be bothersome to her husband or to her I would then refer to a sleep dentist.  The patient is excited about her upcoming retirement, counting the days. She is motivated to start exercising and dieting when her "work stress is no longer dictating "her life.     Review of Systems: Out of a complete 14 system review, the patient complains of only the following symptoms, and all other reviewed systems are negative.  Snoring, limited sleep, husband complaints of PLMs, " kicking him "  Epworth score 4 , Fatigue severity score 15 , depression score 5/15    Social History   Social History  . Marital status: Married    Spouse name: N/A  . Number of children: 2  . Years of education: 16   Occupational History  . 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 Smoking status: Never Smoker  . Smokeless tobacco: Never Used  . Alcohol use 0.0 oz/week     Comment: Occasional wine  . Drug use: No  . Sexual activity: Not on file   Other Topics Concern  . Not on file   Social History Narrative   Lives at home with her husband and mother-in-law.   Right-handed.   2 cups caffeine per day.    Family History  Problem Relation Age of Onset  . Emphysema Mother   . Heart disease Mother   . Osteoporosis Mother   . Alcoholism Father   . Heart attack Sister 87    smoker  . Heart attack Sister 70    smoker    Past Medical History:  Diagnosis Date  . Arm pain left  . Arthritis   . GERD (gastroesophageal reflux disease)   . Headache   . Hyperlipemia   . PONV (postoperative nausea and vomiting)   . Seasonal allergies   .  TIA (transient ischemic attack) 2012   no residual  . Wears glasses     Past Surgical History:  Procedure Laterality Date  . ABDOMINAL HYSTERECTOMY    . CARDIAC CATHETERIZATION N/A 04/01/2016   Procedure: Left Heart Cath and Coronary Angiography;  Surgeon: Peter M Swaziland, MD;  Location: [facility] INVASIVE CV LAB;  Service: Cardiovascular;  Laterality: N/A;  . CESAREAN SECTION     x2  . CHOLECYSTECTOMY    . COLONOSCOPY    . DIAGNOSTIC LAPAROSCOPY    . DILATION AND CURETTAGE OF UTERUS    . LARYNGOSCOPY     vocal cord polyps  . SHOULDER ARTHROSCOPY WITH ROTATOR CUFF REPAIR Left 11/26/2014   Procedure: LEFT SHOULDER ARTHROSCOPY W/ SAD/DCR (no cuff repair);  Surgeon:  Nestor Lewandowsky, MD;  Location: [facility];  Service: Orthopedics;  Laterality: Left;  . TONSILLECTOMY      Current Outpatient Prescriptions  Medication Sig Dispense Refill  . amLODipine (NORVASC) 5 MG tablet   1  . aspirin 81 MG tablet Take 81 mg by mouth daily.    Marland Kitchen atorvastatin (LIPITOR) 20 MG tablet Take 20 mg by mouth daily.    . cholecalciferol (VITAMIN D) 1000 UNITS tablet Take 1,000 Units by mouth daily.    . diphenhydramine-acetaminophen (TYLENOL PM) 25-500 MG TABS tablet Take 1 tablet by mouth at bedtime as needed (sleep).    . loratadine (CLARITIN) 10 MG tablet Take 10 mg by mouth daily.    Marland Kitchen omeprazole (PRILOSEC) 20 MG capsule Take 20 mg by mouth daily.    Marland Kitchen tiZANidine (ZANAFLEX) 2 MG tablet Take 1 tablet (2 mg total) by mouth 3 (three) times daily. 90 tablet 6   No current facility-administered medications for this visit.     Allergies as of 02/09/2017 - Review Complete 02/09/2017  Allergen Reaction Noted  . Codeine Other (See Comments) 03/31/2016    Vitals: BP (!) 110/52   Pulse 60   Resp 18   Ht 5\' 3"  (1.6 m)   Wt 188 lb (85.3 kg)   BMI 33.30 kg/m  Last Weight:  Wt Readings from Last 1 Encounters:  02/09/17 188 lb (85.3 kg)   WJX:BJYN mass index is 33.3 kg/m.     Last Height:   Ht  Readings from Last 1 Encounters:  02/09/17 5\' 3"  (1.6 m)    Physical exam:  General: The patient is awake, alert and appears not in acute distress. The patient is well groomed. Head: Normocephalic, atraumatic. Neck is supple. Mallampati 2-3, crowded dental status lower jaw.  neck circumference:17. Nasal airflow restricted , Cardiovascular:  Regular rate and rhythm  without  murmurs or carotid bruit, and without distended neck veins. Respiratory: Lungs are clear to auscultation. Skin:  Without evidence of edema, or rash, but facial erythema and puffiness.  Trunk: BMI is 34- The patient's posture is erect.  Neurologic exam : The patient is awake and alert, oriented to place and time.   Attention span & concentration ability appears normal.  Speech is fluent,  without dysarthria, dysphonia or aphasia.  Mood and affect are appropriate.  Cranial nerves: Pupils are equal and briskly reactive to light.  Visual fields by finger perimetry are intact. Hearing to finger rub intact.  Facial sensation intact to fine touch.  Facial motor strength is symmetric and tongue and uvula move midline, without f tremor or fasciculation. Shoulder shrug was symmetrical. Left shoulder mild crepitus.   Motor exam:  Normal tone, muscle bulk and symmetrical strength in all extremities. The patient was advised of the nature of the diagnosed sleep disorder , the treatment options and risks for general a health and wellness arising from not treating the condition.  I spent more than 15  minutes of face to face time with the patient. Greater than 50% of time was spent in counseling and coordination of care. We have discussed the diagnosis and differential and I answered the patient's questions.     Assessment:  After physical and neurologic examination, review of laboratory studies,  Personal review of pre-existing records as far as provided in visit., my assessment is   1)snoring and PLMs, very mild but supine  dependent apnea. 2) Tennisball methode, low carb diet ( [employer], [employer], [place]) If this  fails, dental device.   Follow up with Dr Terrace Arabia prn.     Porfirio Mylar Shaida Route MD  02/09/2017  CC: Dr Terrace Arabia, GNA    Daisy Floro, Md [address] [place], [place] [address]

## 2017-04-14 ENCOUNTER — Encounter (INDEPENDENT_AMBULATORY_CARE_PROVIDER_SITE_OTHER): Payer: Self-pay

## 2017-04-14 ENCOUNTER — Ambulatory Visit (INDEPENDENT_AMBULATORY_CARE_PROVIDER_SITE_OTHER): Payer: BLUE CROSS/BLUE SHIELD | Admitting: Nurse Practitioner

## 2017-04-14 ENCOUNTER — Encounter: Payer: Self-pay | Admitting: Nurse Practitioner

## 2017-04-14 VITALS — BP 116/73 | HR 78 | Resp 16 | Ht 63.0 in | Wt 189.0 lb

## 2017-04-14 DIAGNOSIS — G43109 Migraine with aura, not intractable, without status migrainosus: Secondary | ICD-10-CM

## 2017-04-14 DIAGNOSIS — R51 Headache: Secondary | ICD-10-CM

## 2017-04-14 DIAGNOSIS — G4486 Cervicogenic headache: Secondary | ICD-10-CM | POA: Insufficient documentation

## 2017-04-14 MED ORDER — TIZANIDINE HCL 2 MG PO TABS: 2.0000 mg | ORAL_TABLET | Freq: Every day | ORAL | 3 refills | Status: DC

## 2017-04-14 NOTE — Patient Instructions (Signed)
Decrease Tizanidine to 2 mg at hs only Continue neck stretching exercises F/U yearly and prn

## 2017-04-14 NOTE — Progress Notes (Signed)
[facility]  PATIENT: Alexis Frye DOB: 09-14-1951   REASON FOR VISIT: Follow-up for ocular migraine, cervicogenic headache HISTORY FROM: Patient    HISTORY OF PRESENT ILLNESS:Alexis Frye is a 66 years old right-handed female, seen in refer by  his primary care physician Dr. Gildardo Cranker for evaluation of headaches, left occipital area pain  She reported headache since 66 years old, always located at left occipital region, spreading forward, she described constant pressure pain, initially she responded to left occipital nerve block, eventually was evaluated by [facility] pain clinic around 2000, had left occipital area neurostimulator placement, but was not sure about the benefit, was removed in 2015. She is now have constant occipital area headaches, pressure pain, 4 out of 10, spreading forward sometimes, become more severe 10 out of 10 headache, usually triggered by stress, relieved by sleeping dark quiet room, during her headaches, she has nausea, often made worse by Tylenol  She denied visual loss, no lateralized motor or sensory deficit, complains of a lot of work-related stress.  She was treated with topiramate since 2015 without help   She also reported a history of abnormal brain scan in the past, but could not elaborate on detail, she is worried about the changes, wants to have repeat brain scan. UPDATE Oct 15 2016:YY She had one ocular migraine in Sept 2017, she had sudden onset blurry vision, flashing light last for 15 minutes, but she has no significant headaches. She has headaches almost all the time. She had cervical stimulator for her headache without helping her symptoms, she denies significant improvement with previous left occipital nerve block We have personally reviewed MRI of brain in September 2017, there was no significant abnormality noticed Laboratory evaluation in September showed negative troponin, normal CBC, CMP, she presented to the  hospital for chest pain, radiating to left arm, was diagnosed with undigestion, no cardiac etiology found She also complains frequent awakening at nighttime, difficulty sleeping, daytime fatigue UPDATE 05/02/2018CM [name], 66 year old female returns for follow-up with a history of ocular migraine where she will have a sudden onset of blurry vision, flashing light and this lasts for about 15 minutes. She also has occasional neck pain. She has been taking tizanidine 2 mg 3 times a day however she has retired since last seen and she would like to reduce her medication since her ocular migraines are much less frequent. She was on Topamax at one time but has discontinued that medication. Sleep study performed 12/08/2016 showed very mild with supine dependent apnea. She was advised to use a tennis ball method follow a low carb diet and get a dental device if that failed .She returns for reevaluation  REVIEW OF SYSTEMS: Full 14 system review of systems performed and notable only for those listed, all others are neg:  Constitutional: neg  Cardiovascular: neg Ear/Nose/Throat: neg  Skin: neg Eyes: neg Respiratory: neg Gastroitestinal: neg  Hematology/Lymphatic: neg  Endocrine: neg Musculoskeletal:neg Allergy/Immunology: neg Neurological: neg Psychiatric: neg Sleep : neg   ALLERGIES: Allergies  Allergen Reactions  . Codeine Other (See Comments)    unk reaction.     HOME MEDICATIONS: Outpatient Medications Prior to Visit  Medication Sig Dispense Refill  . amLODipine (NORVASC) 5 MG tablet   1  . aspirin 81 MG tablet Take 81 mg by mouth daily.    Marland Kitchen atorvastatin (LIPITOR) 20 MG tablet Take 20 mg by mouth daily.    . cholecalciferol (VITAMIN D) 1000 UNITS tablet Take 1,000 Units by  mouth daily.    . diphenhydramine-acetaminophen (TYLENOL PM) 25-500 MG TABS tablet Take 1 tablet by mouth at bedtime as needed (sleep).    . loratadine (CLARITIN) 10 MG tablet Take 10 mg by mouth daily.    Marland Kitchen  omeprazole (PRILOSEC) 20 MG capsule Take 20 mg by mouth daily.    Marland Kitchen tiZANidine (ZANAFLEX) 2 MG tablet Take 1 tablet (2 mg total) by mouth 3 (three) times daily. 90 tablet 6   No facility-administered medications prior to visit.     PAST MEDICAL HISTORY: Past Medical History:  Diagnosis Date  . Arm pain left  . Arthritis   . GERD (gastroesophageal reflux disease)   . Headache   . Hyperlipemia   . PONV (postoperative nausea and vomiting)   . Seasonal allergies   . TIA (transient ischemic attack) 2012   no residual  . Wears glasses     PAST SURGICAL HISTORY: Past Surgical History:  Procedure Laterality Date  . ABDOMINAL HYSTERECTOMY    . CARDIAC CATHETERIZATION N/A 04/01/2016   Procedure: Left Heart Cath and Coronary Angiography;  Surgeon: Peter M Swaziland, MD;  Location: [facility] INVASIVE CV LAB;  Service: Cardiovascular;  Laterality: N/A;  . CESAREAN SECTION     x2  . CHOLECYSTECTOMY    . COLONOSCOPY    . DIAGNOSTIC LAPAROSCOPY    . DILATION AND CURETTAGE OF UTERUS    . LARYNGOSCOPY     vocal cord polyps  . SHOULDER ARTHROSCOPY WITH ROTATOR CUFF REPAIR Left 11/26/2014   Procedure: LEFT SHOULDER ARTHROSCOPY W/ SAD/DCR (no cuff repair);  Surgeon: Nestor Lewandowsky, MD;  Location: [facility];  Service: Orthopedics;  Laterality: Left;  . TONSILLECTOMY      FAMILY HISTORY: Family History  Problem Relation Age of Onset  . Emphysema Mother   . Heart disease Mother   . Osteoporosis Mother   . Alcoholism Father   . Heart attack Sister 83    smoker  . Heart attack Sister 76    smoker    SOCIAL HISTORY: Social History   Social History  . Marital status: Married    Spouse name: N/A  . Number of children: 2  . Years of education: 16   Occupational History  . 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 Smoking status: Never Smoker  . Smokeless tobacco: Never Used  . Alcohol use 0.0 oz/week     Comment: Occasional  wine  . Drug use: No  . Sexual activity: Not on file   Other Topics Concern  . Not on file   Social History Narrative   Lives at home with her husband and mother-in-law.   Right-handed.   2 cups caffeine per day.     PHYSICAL EXAM  Vitals:   04/14/17 0756  BP: 116/73  Pulse: 78  Resp: 16  Weight: 189 lb (85.7 kg)  Height:  (1.6 m)   Body mass index is 33.48 kg/m.  Generalized: Well developed, Obese female in no acute distress  Head: normocephalic and atraumatic,. Oropharynx benign  Neck: Supple,  Musculoskeletal: No deformity   Neurological examination   Mentation: Alert oriented to time, place, history taking. Attention span and concentration appropriate. Recent and remote memory intact.  Follows all commands speech and language fluent.   Cranial nerve II-XII: Pupils were equal round reactive to light extraocular movements were full, visual field were full on confrontational test. Facial sensation and strength  were normal. hearing was intact to finger rubbing bilaterally. Uvula tongue midline. head turning and shoulder shrug were normal and symmetric.Tongue protrusion into cheek strength was normal. Motor: normal bulk and tone, full strength in the BUE, BLE, fine finger movements normal, no pronator drift. No focal weakness Sensory: normal and symmetric to light touch, pinprick, and  Vibration, in the upper and lower extremities  Coordination: finger-nose-finger, heel-to-shin bilaterally, no dysmetria Reflexes: Brachioradialis 2/2, biceps 2/2, triceps 2/2, patellar 2/2, Achilles 2/2, plantar responses were flexor bilaterally. Gait and Station: Rising up from seated position without assistance, normal stance,  moderate stride, good arm swing, smooth turning, able to perform tiptoe, and heel walking without difficulty. Tandem gait is steady  DIAGNOSTIC DATA (LABS, IMAGING, TESTING) - I reviewed patient records, labs, notes, testing and imaging myself where  available.  Lab Results  Component Value Date   WBC 6.1 08/21/2016   HGB 14.7 08/21/2016   HCT 43.4 08/21/2016   MCV 92.1 08/21/2016   PLT 194 08/21/2016      Component Value Date/Time   NA 140 08/21/2016 0814   K 4.6 08/21/2016 0814   CL 109 08/21/2016 0814   CO2 23 08/21/2016 0814   GLUCOSE 96 08/21/2016 0814   BUN 16 08/21/2016 0814   CREATININE 0.79 08/21/2016 0814   CALCIUM 9.7 08/21/2016 0814   PROT 6.6 08/21/2016 0814   ALBUMIN 4.3 08/21/2016 0814   AST 22 08/21/2016 0814   ALT 23 08/21/2016 0814   ALKPHOS 85 08/21/2016 0814   BILITOT 0.9 08/21/2016 0814   GFRNONAA >60 08/21/2016 0814   GFRAA >60 08/21/2016 0814      ASSESSMENT AND PLAN  66 y.o. 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 Sleep study showed  very mild with supine dependent apnea. She was advised to use a tennis ball method follow a low carb diet and get a dental device if that failed                PLAN: Decrease Tizanidine to 2 mg at hs only, this is for your ocular migraine as well as cervicogenic headache Continue neck stretching exercises F/U yearly and prn I spent 15 min  in total face to face time with the patient more than 50% of which was spent counseling and coordination of care, reviewing test results reviewing medications and discussing and reviewing the diagnosis of ocular migraine and neck pain contributing to headache and further treatment options. Call for increase in symptoms , Nilda Riggs, [facility], [facility], APRN  [facility] [address] [place], [place] [address] [phone]

## 2017-04-19 NOTE — Progress Notes (Signed)
I have reviewed and agreed above plan. 

## 2017-12-31 IMAGING — DX DG LUMBAR SPINE 2-3V
2 series · 2 of 2 positions shown · non-contrast
Comparison: 07/24/2013 abdominal radiograph.

CLINICAL DATA: Evaluate for foreign body prior to MRI.

EXAM:
LUMBAR SPINE - 2-3 VIEW

[t lumbar spine ap]
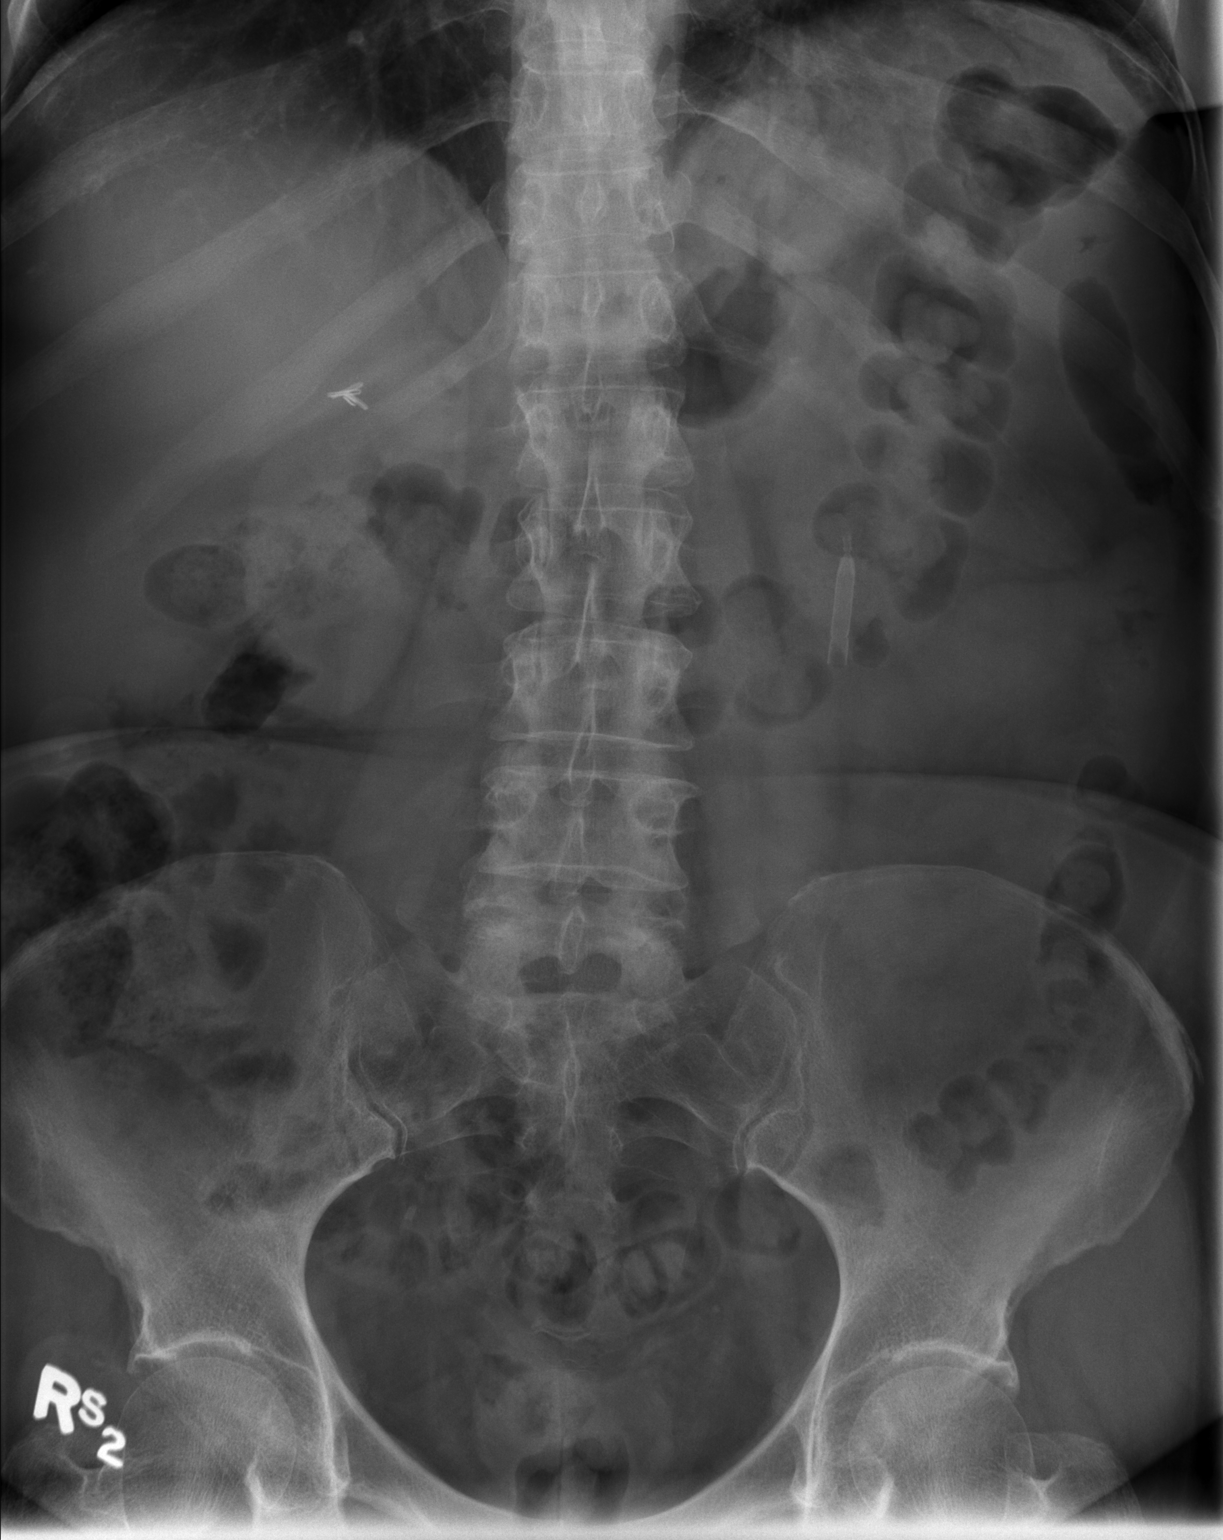

[t lumbar spine lat]
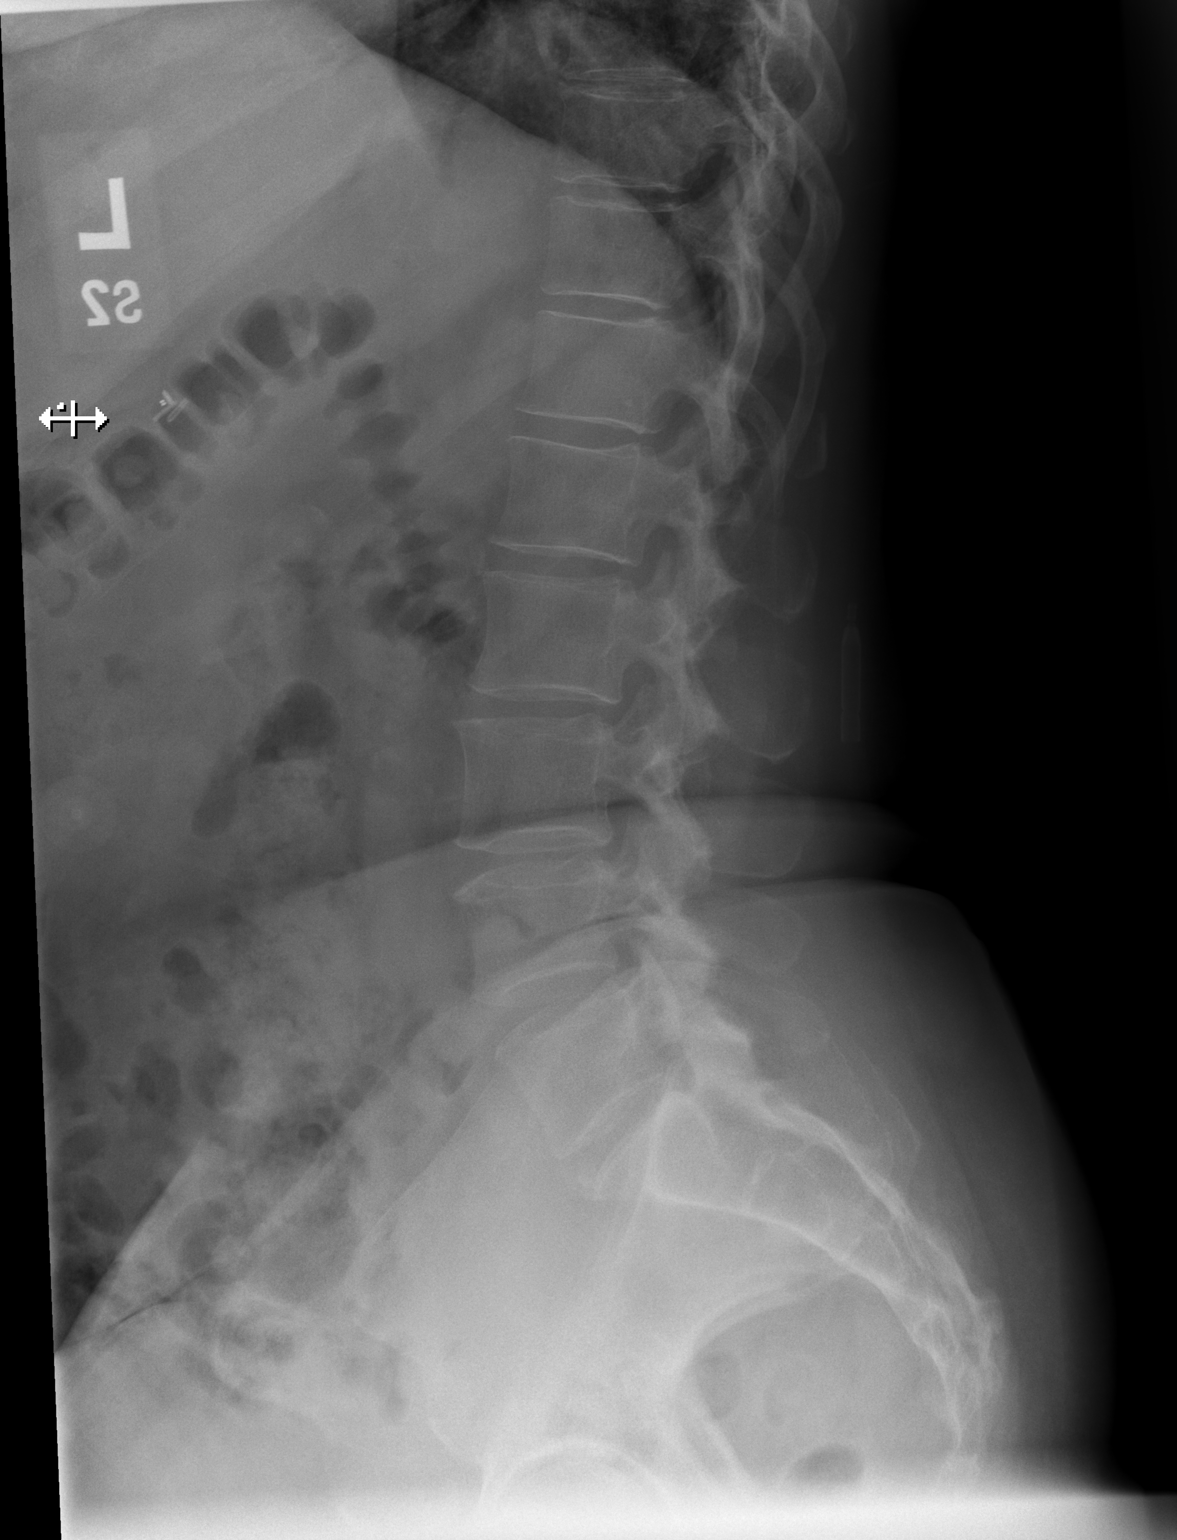

[2 of 2 positions shown; findings below may reference images not displayed]

FINDINGS: There is a radiodense linear 3.9 x 0.6 cm foreign body in the
superficial left back soft tissues at the L2-3 level.
Cholecystectomy clips are seen in the right upper quadrant of the
abdomen.

This report assumes 5 non rib-bearing lumbar vertebrae. Lumbar
vertebral body heights are preserved, with no fracture.Mild
degenerative disc disease at L1-2, L3-4 and L5-S1. No
spondylolisthesis. Mild facet arthropathy bilaterally in the lower
lumbar spine. No aggressive appearing focal osseous lesions.
IMPRESSION: 1. Radiodense linear 3.9 x 0.6 cm foreign body in the superficial
left back soft tissues at the L2-3 level.
2. Mild multilevel degenerative disc disease in the lumbar spine.

## 2017-12-31 IMAGING — MR MR HEAD W/O CM
9 of 10 series · 35 of 48 positions shown · non-contrast
Comparison: 08/21/2016 CT head.  09/25/2015 MRI brain.

CLINICAL DATA: 65 y/o F; episode of blurred vision at 9769 hours
while reading.

EXAM:
MRI HEAD WITHOUT CONTRAST
TECHNIQUE: Multiplanar, multiecho pulse sequences of the brain and surrounding
structures were obtained without intravenous contrast.

[Series 3: T1 · sagittal · 5.0mm · 0.47mm/px · 1 of 23 slices shown]
[im 1/23]
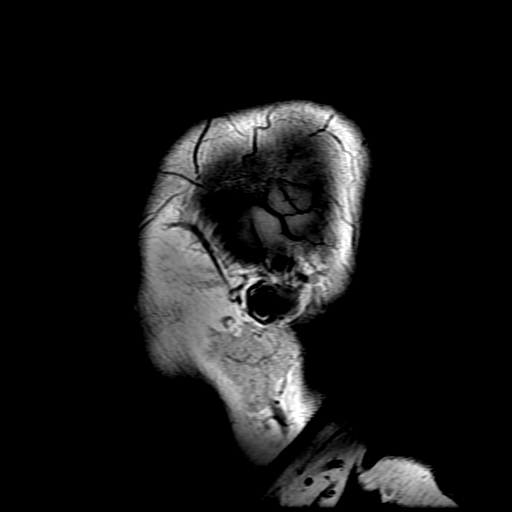

[Series 4: DWI · axial · 3.0mm · 1.09mm/px · z∈[-54,+83]mm · 8 of 94 slices shown (1 of 4)]
[im 1/94]
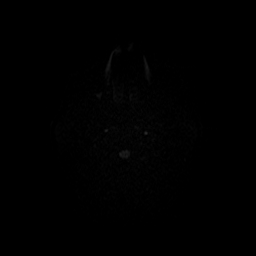
[im 11/94]
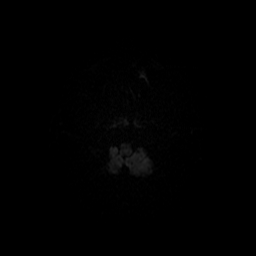
[im 32/94]
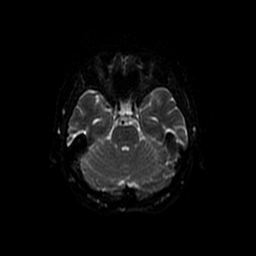
[im 42/94]
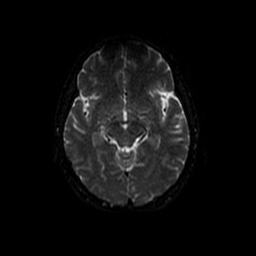
[im 52/94]
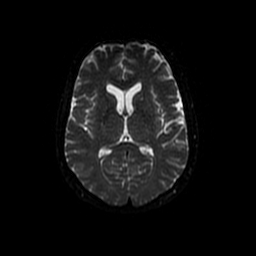
[im 63/94]
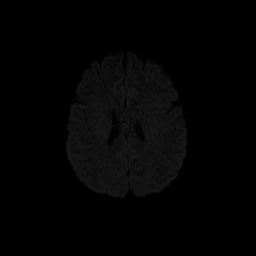
[im 83/94]
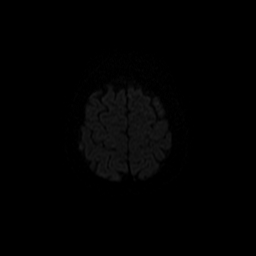
[im 94/94]
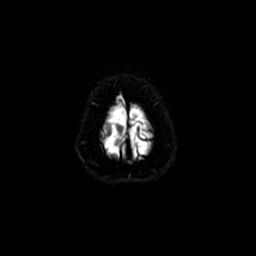

[Series 5: T2 · axial · 5.0mm · 0.43mm/px · z∈[-58,+78]mm · 3 of 24 slices shown (1 of 2)]
[im 1/24]
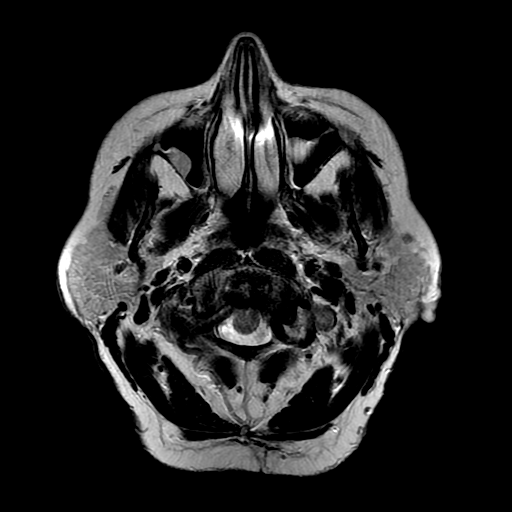
[im 12/24]
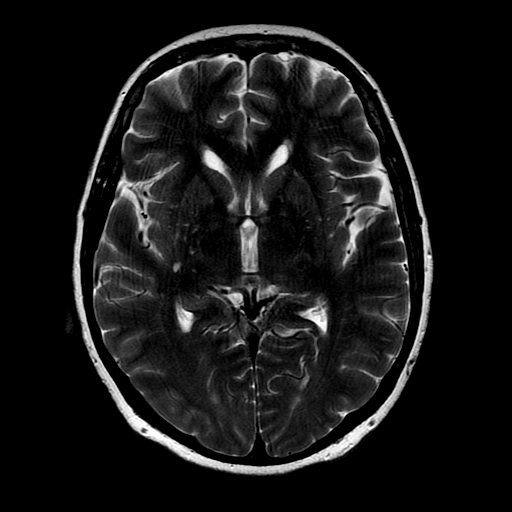
[im 24/24]
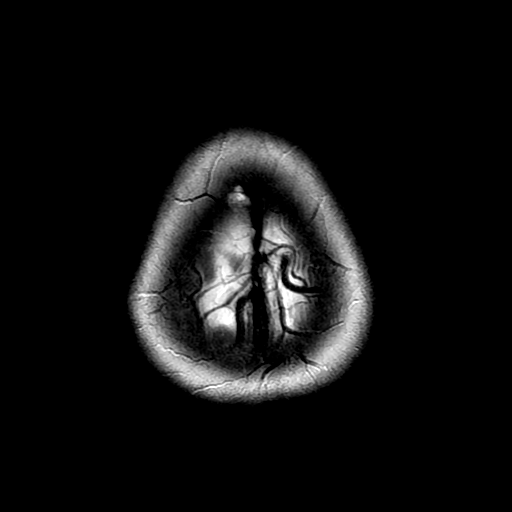

[Series 6: DWI · coronal · 5.0mm · 1.09mm/px · 7 of 66 slices shown (2 of 4)]
[im 1/66]
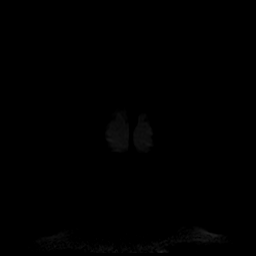
[im 11/66]
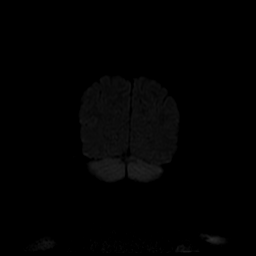
[im 22/66]
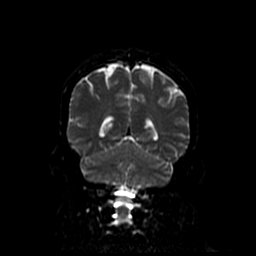
[im 33/66]
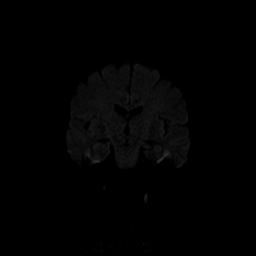
[im 44/66]
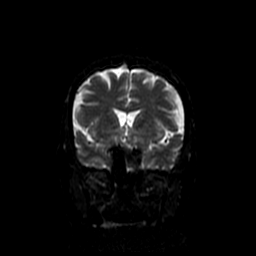
[im 55/66]
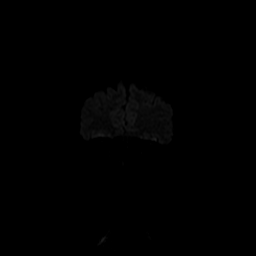
[im 66/66]
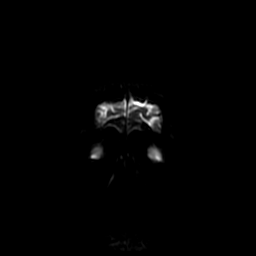

[Series 7: FLAIR · axial · 5.0mm · 0.43mm/px · z∈[-58,+78]mm · 3 of 24 slices shown]
[im 1/24]
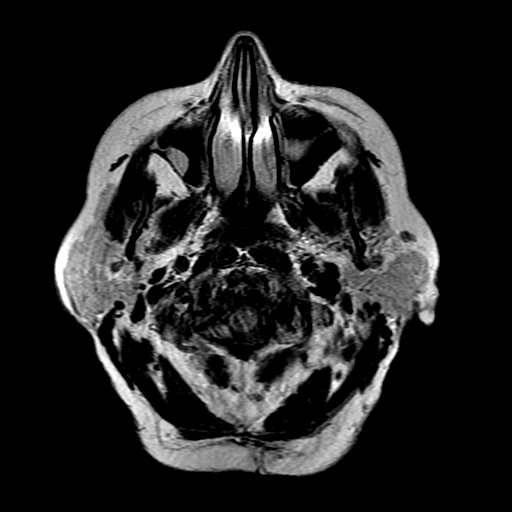
[im 12/24]
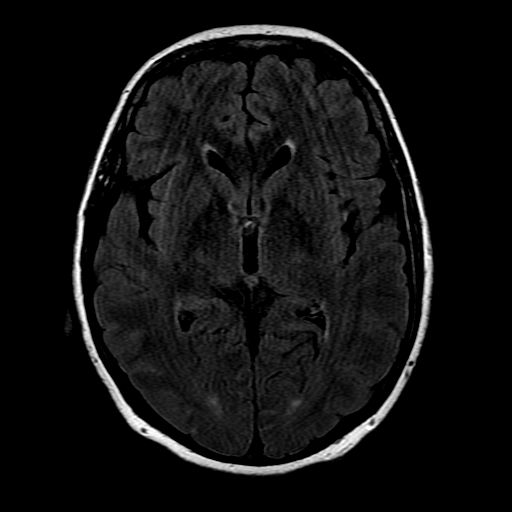
[im 24/24]
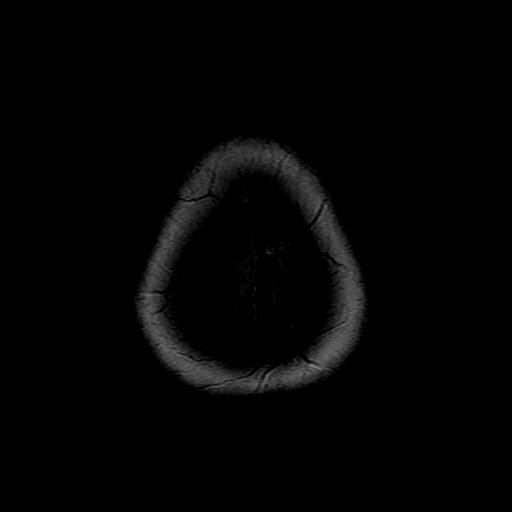

[Series 8: ax mpgr · axial · 5.0mm · 0.43mm/px · z∈[-58,+7]mm · 2 of 24 slices shown]
[im 1/24]
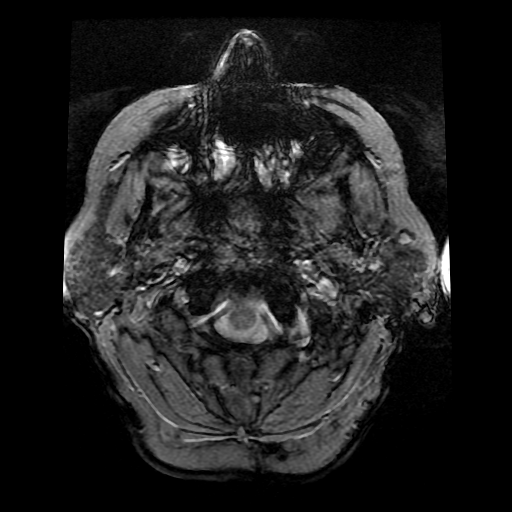
[im 12/24]
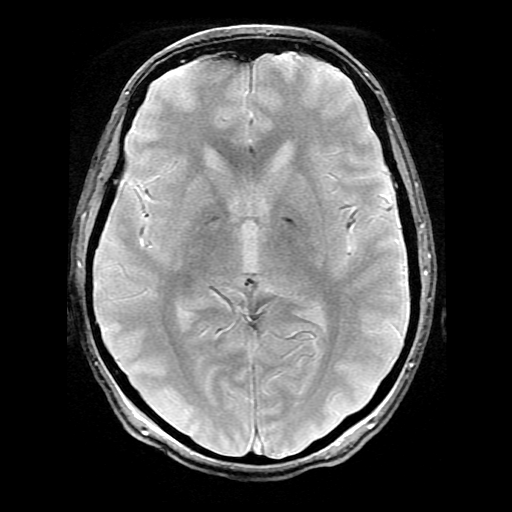

[Series 10: T2 · coronal · 5.0mm · 0.39mm/px · 3 of 25 slices shown (2 of 2)]
[im 1/25]
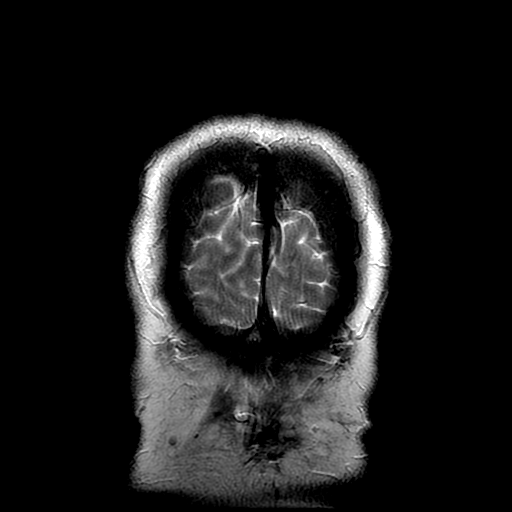
[im 13/25]
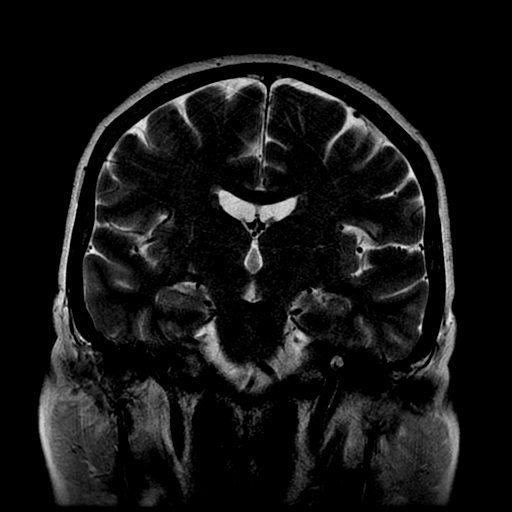
[im 25/25]
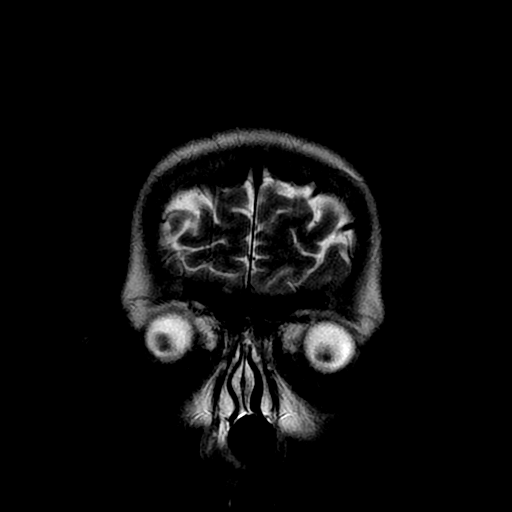

[Series 400: DWI · axial · 3.0mm · 1.09mm/px · z∈[-54,+83]mm · 5 of 47 slices shown (3 of 4)]
[im 1/47]
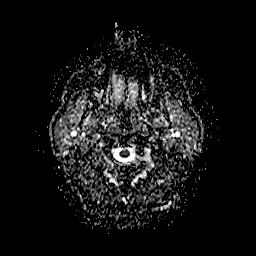
[im 12/47]
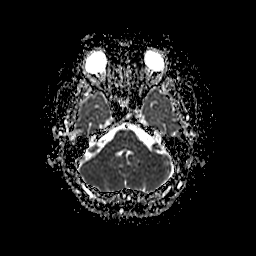
[im 24/47]
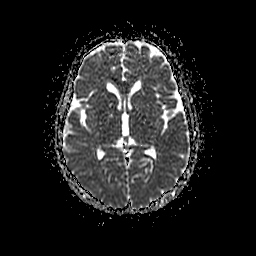
[im 35/47]
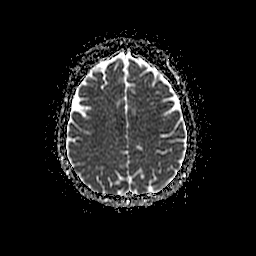
[im 47/47]
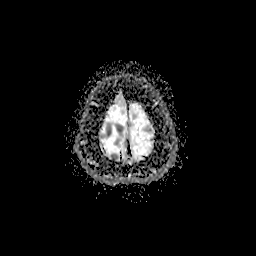

[Series 600: DWI · coronal · 5.0mm · 1.09mm/px · 3 of 33 slices shown (4 of 4)]
[im 1/33]
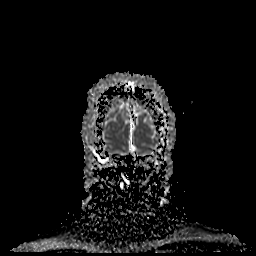
[im 17/33]
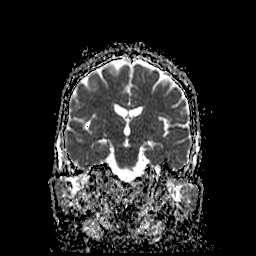
[im 33/33]
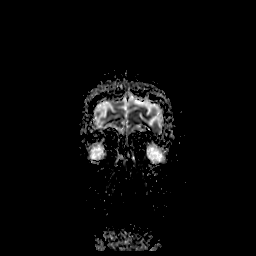

[35 of 48 positions shown; findings below may reference images not displayed]

FINDINGS: Brain: No acute infarction, hemorrhage, hydrocephalus, extra-axial
collection or mass lesion. Few stable nonspecific foci of T2 FLAIR
hyperintensity in subcortical white matter probably represent
minimal chronic microvascular ischemic changes.

Vascular: Normal flow voids.

Skull and upper cervical spine: Normal marrow signal.

Sinuses/Orbits: Mucous retention cysts within the maxillary sinuses
bilaterally and mild diffuse paranasal sinus mucosal thickening. No
abnormal signal of mastoid air cells. Negative orbits.

Other: None.
IMPRESSION: 1. No acute intracranial abnormality is identified.
2. Mild paranasal sinus disease. Stable minimal chronic
microvascular ischemic changes.

By: Anaya Chimbo M.D.

## 2018-04-18 ENCOUNTER — Ambulatory Visit: Payer: BLUE CROSS/BLUE SHIELD | Admitting: Nurse Practitioner

## 2018-12-14 HISTORY — PX: CATARACT EXTRACTION: SUR2

## 2020-01-13 ENCOUNTER — Ambulatory Visit: Payer: BLUE CROSS/BLUE SHIELD

## 2020-01-18 ENCOUNTER — Ambulatory Visit: Payer: BLUE CROSS/BLUE SHIELD

## 2020-01-20 ENCOUNTER — Ambulatory Visit: Payer: Medicare Other | Attending: Internal Medicine

## 2020-01-20 DIAGNOSIS — Z23 Encounter for immunization: Secondary | ICD-10-CM | POA: Insufficient documentation

## 2020-01-20 NOTE — Progress Notes (Signed)
   Covid-19 Vaccination Clinic  Name:  Alexis Frye    MRN: [id number] DOB: Jul 20, 1951  01/20/2020  [name] was observed post Covid-19 immunization for 15 minutes without incidence. She was provided with Vaccine Information Sheet and instruction to access the V-Safe system.   [name] was instructed to call 911 with any severe reactions post vaccine: Marland Kitchen Difficulty breathing  . Swelling of your face and throat  . A fast heartbeat  . A bad rash all over your body  . Dizziness and weakness    Immunizations Administered    Name Date Dose VIS Date Route   Pfizer COVID-19 Vaccine 01/20/2020  5:45 PM 0.3 mL 11/24/2019 Intramuscular   Manufacturer: [employer], [employer]   Lot: [id number]   NDC: [address]-6

## 2020-02-14 ENCOUNTER — Ambulatory Visit: Payer: Medicare Other | Attending: Internal Medicine

## 2020-02-14 DIAGNOSIS — Z23 Encounter for immunization: Secondary | ICD-10-CM | POA: Insufficient documentation

## 2020-02-14 NOTE — Progress Notes (Signed)
   Covid-19 Vaccination Clinic  Name:  SURY WENTWORTH    MRN: [id number] DOB: 21-Sep-1951  02/14/2020  Ms. Ciolek was observed post Covid-19 immunization for 15 minutes without incident. She was provided with Vaccine Information Sheet and instruction to access the V-Safe system.   Ms. Mantey was instructed to call 911 with any severe reactions post vaccine: Marland Kitchen Difficulty breathing  . Swelling of face and throat  . A fast heartbeat  . A bad rash all over body  . Dizziness and weakness   Immunizations Administered    Name Date Dose VIS Date Route   Pfizer COVID-19 Vaccine 02/14/2020 12:24 PM 0.3 mL 11/24/2019 Intramuscular   Manufacturer: [employer], [employer]   Lot: [id number]   NDC: [address]-4

## 2020-08-15 ENCOUNTER — Emergency Department (HOSPITAL_COMMUNITY): Payer: Medicare Other

## 2020-08-15 ENCOUNTER — Observation Stay (HOSPITAL_COMMUNITY)
Admission: EM | Admit: 2020-08-15 | Discharge: 2020-08-16 | Disposition: A | Discharge status: Home / Self Care | Payer: Medicare Other | Attending: Family Medicine | Admitting: Family Medicine

## 2020-08-15 ENCOUNTER — Encounter (HOSPITAL_COMMUNITY): Payer: Self-pay

## 2020-08-15 ENCOUNTER — Observation Stay (HOSPITAL_COMMUNITY): Payer: Medicare Other

## 2020-08-15 DIAGNOSIS — K219 Gastro-esophageal reflux disease without esophagitis: Secondary | ICD-10-CM | POA: Insufficient documentation

## 2020-08-15 DIAGNOSIS — Z79899 Other long term (current) drug therapy: Secondary | ICD-10-CM | POA: Diagnosis not present

## 2020-08-15 DIAGNOSIS — Z7982 Long term (current) use of aspirin: Secondary | ICD-10-CM | POA: Insufficient documentation

## 2020-08-15 DIAGNOSIS — R531 Weakness: Secondary | ICD-10-CM

## 2020-08-15 DIAGNOSIS — Z20822 Contact with and (suspected) exposure to covid-19: Secondary | ICD-10-CM | POA: Insufficient documentation

## 2020-08-15 DIAGNOSIS — G459 Transient cerebral ischemic attack, unspecified: Principal | ICD-10-CM | POA: Insufficient documentation

## 2020-08-15 DIAGNOSIS — R059 Cough, unspecified: Secondary | ICD-10-CM

## 2020-08-15 DIAGNOSIS — E785 Hyperlipidemia, unspecified: Secondary | ICD-10-CM | POA: Diagnosis not present

## 2020-08-15 DIAGNOSIS — I10 Essential (primary) hypertension: Secondary | ICD-10-CM | POA: Diagnosis not present

## 2020-08-15 DIAGNOSIS — R202 Paresthesia of skin: Secondary | ICD-10-CM | POA: Diagnosis present

## 2020-08-15 LAB — DIFFERENTIAL
Abs Immature Granulocytes: 0.02 10*3/uL (ref 0.00–0.07)
Basophils Absolute: 0 10*3/uL (ref 0.0–0.1)
Basophils Relative: 1 %
Eosinophils Absolute: 0.1 10*3/uL (ref 0.0–0.5)
Eosinophils Relative: 1 %
Immature Granulocytes: 0 %
Lymphocytes Relative: 36 %
Lymphs Abs: 3 10*3/uL (ref 0.7–4.0)
Monocytes Absolute: 0.5 10*3/uL (ref 0.1–1.0)
Monocytes Relative: 6 %
Neutro Abs: 4.9 10*3/uL (ref 1.7–7.7)
Neutrophils Relative %: 56 %

## 2020-08-15 LAB — I-STAT CHEM 8, ED
BUN: 27 mg/dL — ABNORMAL HIGH (ref 8–23)
Calcium, Ion: 1.18 mmol/L (ref 1.15–1.40)
Chloride: 104 mmol/L (ref 98–111)
Creatinine, Ser: 0.8 mg/dL (ref 0.44–1.00)
Glucose, Bld: 97 mg/dL (ref 70–99)
HCT: 43 % (ref 36.0–46.0)
Hemoglobin: 14.6 g/dL (ref 12.0–15.0)
Potassium: 4 mmol/L (ref 3.5–5.1)
Sodium: 140 mmol/L (ref 135–145)
TCO2: 24 mmol/L (ref 22–32)

## 2020-08-15 LAB — CBC
HCT: 42.5 % (ref 36.0–46.0)
Hemoglobin: 14.3 g/dL (ref 12.0–15.0)
MCH: 30.6 pg (ref 26.0–34.0)
MCHC: 33.6 g/dL (ref 30.0–36.0)
MCV: 91 fL (ref 80.0–100.0)
Platelets: 224 10*3/uL (ref 150–400)
RBC: 4.67 MIL/uL (ref 3.87–5.11)
RDW: 12.2 % (ref 11.5–15.5)
WBC: 8.5 10*3/uL (ref 4.0–10.5)
nRBC: 0 % (ref 0.0–0.2)

## 2020-08-15 LAB — COMPREHENSIVE METABOLIC PANEL
ALT: 18 U/L (ref 0–44)
AST: 20 U/L (ref 15–41)
Albumin: 4.5 g/dL (ref 3.5–5.0)
Alkaline Phosphatase: 95 U/L (ref 38–126)
Anion gap: 12 (ref 5–15)
BUN: 23 mg/dL (ref 8–23)
CO2: 24 mmol/L (ref 22–32)
Calcium: 9.5 mg/dL (ref 8.9–10.3)
Chloride: 103 mmol/L (ref 98–111)
Creatinine, Ser: 0.83 mg/dL (ref 0.44–1.00)
GFR calc Af Amer: >60 mL/min (ref >60)
GFR calc non Af Amer: >60 mL/min (ref >60)
Glucose, Bld: 106 mg/dL — ABNORMAL HIGH (ref 70–99)
Potassium: 3.8 mmol/L (ref 3.5–5.1)
Sodium: 139 mmol/L (ref 135–145)
Total Bilirubin: 0.8 mg/dL (ref 0.3–1.2)
Total Protein: 7.1 g/dL (ref 6.5–8.1)

## 2020-08-15 LAB — PROTIME-INR
INR: 0.9 (ref 0.8–1.2)
Prothrombin Time: 11.9 seconds (ref 11.4–15.2)

## 2020-08-15 LAB — CBG MONITORING, ED: Glucose-Capillary: 108 mg/dL — ABNORMAL HIGH (ref 70–99)

## 2020-08-15 LAB — APTT: aPTT: 32 seconds (ref 24–36)

## 2020-08-15 MED ORDER — ENOXAPARIN SODIUM 40 MG/0.4ML ~~LOC~~ SOLN
40.0000 mg | Freq: Every day | SUBCUTANEOUS | Status: DC
  Administered 2020-08-16: 40 mg via SUBCUTANEOUS
  Filled 2020-08-15: qty 0.4

## 2020-08-15 MED ORDER — ACETAMINOPHEN 650 MG RE SUPP: 650.0000 mg | RECTAL | Status: DC | PRN

## 2020-08-15 MED ORDER — SENNOSIDES-DOCUSATE SODIUM 8.6-50 MG PO TABS: 1.0000 | ORAL_TABLET | Freq: Every evening | ORAL | Status: DC | PRN

## 2020-08-15 MED ORDER — ACETAMINOPHEN 160 MG/5ML PO SOLN: 650.0000 mg | ORAL | Status: DC | PRN

## 2020-08-15 MED ORDER — LORAZEPAM 2 MG/ML IJ SOLN: 0.5000 mg | INTRAMUSCULAR | Status: DC | PRN

## 2020-08-15 MED ORDER — IOHEXOL 350 MG/ML SOLN
75.0000 mL | Freq: Once | INTRAVENOUS | Status: AC | PRN
  Administered 2020-08-15: 75 mL via INTRAVENOUS

## 2020-08-15 MED ORDER — STROKE: EARLY STAGES OF RECOVERY BOOK: Freq: Once | Status: DC

## 2020-08-15 MED ORDER — SODIUM CHLORIDE 0.45 % IV SOLN: INTRAVENOUS | Status: DC

## 2020-08-15 MED ORDER — ACETAMINOPHEN 325 MG PO TABS: 650.0000 mg | ORAL_TABLET | ORAL | Status: DC | PRN

## 2020-08-15 MED ORDER — SODIUM CHLORIDE 0.9% FLUSH
3.0000 mL | Freq: Once | INTRAVENOUS | Status: DC
Start: 2020-08-15 — End: 2020-08-16

## 2020-08-15 NOTE — ED Triage Notes (Signed)
Pt states LSN at 730 pm, sudden onset of L sided weakness and tingling. L leg weakness continues and L arm tingling continues. Hx of TIA in the past. LVO -

## 2020-08-15 NOTE — Code Documentation (Signed)
Responded to Code Stroke called at 2132 on pt already in triage for L arm tingling and L leg weakness, LSN-1930.  NIH-2, CT head negative, CTA-no LVO. Plan for MRI.

## 2020-08-15 NOTE — ED Provider Notes (Addendum)
[facility] EMERGENCY DEPARTMENT Provider Note   CSN: [id number] Arrival date & time: 08/15/20  2102  An emergency department physician performed an initial assessment on this suspected stroke patient at 2134.  History Chief Complaint  Patient presents with  . Code Stroke    Alexis Frye is a 69 y.o. female.  Patient presents after sudden onset left-sided weakness and tingling that started at 730 this evening.  Patient has a history of TIA.  Patient currently feels improved and no weakness.  No fevers or chills.  Patient presents as code stroke.        Past Medical History:  Diagnosis Date  . Arm pain left  . Arthritis   . GERD (gastroesophageal reflux disease)   . Headache   . Hyperlipemia   . PONV (postoperative nausea and vomiting)   . Seasonal allergies   . TIA (transient ischemic attack) 2012   no residual  . Wears glasses     Patient Active Problem List   Diagnosis Date Noted  . Cervicogenic headache 04/14/2017  . Sleep apnea 10/15/2016  . 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 Essential hypertension   . Pain in the chest   . Chest pain 03/31/2016  . White coat hypertension 03/31/2016  . HLD (hyperlipidemia) 03/31/2016  . Musculoskeletal pain 03/31/2016  . GERD (gastroesophageal reflux disease) 03/31/2016  . Occipital headache 10/29/2015    Past Surgical History:  Procedure Laterality Date  . ABDOMINAL HYSTERECTOMY    . CARDIAC CATHETERIZATION N/A 04/01/2016   Procedure: Left Heart Cath and Coronary Angiography;  Surgeon: Peter M Swaziland, MD;  Location: [facility] INVASIVE CV LAB;  Service: Cardiovascular;  Laterality: N/A;  . CESAREAN SECTION     x2  . CHOLECYSTECTOMY    . COLONOSCOPY    . DIAGNOSTIC LAPAROSCOPY    . DILATION AND CURETTAGE OF UTERUS    . LARYNGOSCOPY     vocal cord polyps  . SHOULDER ARTHROSCOPY WITH ROTATOR CUFF REPAIR Left 11/26/2014   Procedure: LEFT SHOULDER ARTHROSCOPY W/ SAD/DCR (no cuff repair);  Surgeon: Nestor Lewandowsky,  MD;  Location: [facility];  Service: Orthopedics;  Laterality: Left;  . TONSILLECTOMY       OB History   No obstetric history on file.     Family History  Problem Relation Age of Onset  . Emphysema Mother   . Heart disease Mother   . Osteoporosis Mother   . Alcoholism Father   . Heart attack Sister 73       smoker  . Heart attack Sister 89       smoker    Social History   Tobacco Use  . Smoking status: Never Smoker  . Smokeless tobacco: Never Used  Substance Use Topics  . Alcohol use: Yes    Alcohol/week: 0.0 standard drinks    Comment: Occasional wine  . Drug use: No    Home Medications Prior to Admission medications   Medication Sig Start Date End Date Taking? Authorizing Provider  amLODipine (NORVASC) 5 MG tablet  01/07/17   [provider]  aspirin 81 MG tablet Take 81 mg by mouth daily.    [provider]  atorvastatin (LIPITOR) 20 MG tablet Take 20 mg by mouth daily.    [provider]  cholecalciferol (VITAMIN D) 1000 UNITS tablet Take 1,000 Units by mouth daily.    [provider]  diphenhydramine-acetaminophen (TYLENOL PM) 25-500 MG TABS tablet Take 1 tablet by mouth at bedtime as needed (  sleep).    [provider]  loratadine (CLARITIN) 10 MG tablet Take 10 mg by mouth daily.    [provider]  omeprazole (PRILOSEC) 20 MG capsule Take 20 mg by mouth daily.    [provider]  tiZANidine (ZANAFLEX) 2 MG tablet Take 1 tablet (2 mg total) by mouth at bedtime. 04/14/17   Nilda Riggs, NP    Allergies    Codeine  Review of Systems   Review of Systems  Constitutional: Negative for chills and fever.  HENT: Negative for congestion.   Eyes: Negative for visual disturbance.  Respiratory: Negative for shortness of breath.   Cardiovascular: Negative for chest pain.  Gastrointestinal: Negative for abdominal pain and vomiting.  Genitourinary: Negative for dysuria and flank pain.    Musculoskeletal: Negative for back pain, neck pain and neck stiffness.  Skin: Negative for rash.  Neurological: Positive for weakness and numbness. Negative for light-headedness and headaches.    Physical Exam Updated Vital Signs BP 118/70   Pulse (!) 57   Temp 98 F (36.7 C) (Oral)   Resp 19   SpO2 97%   Physical Exam Vitals and nursing note reviewed.  Constitutional:      Appearance: She is well-developed.  HENT:     Head: Normocephalic and atraumatic.  Eyes:     General:        Right eye: No discharge.        Left eye: No discharge.     Conjunctiva/sclera: Conjunctivae normal.  Neck:     Trachea: No tracheal deviation.  Cardiovascular:     Rate and Rhythm: Normal rate and regular rhythm.  Pulmonary:     Effort: Pulmonary effort is normal.     Breath sounds: Normal breath sounds.  Abdominal:     General: There is no distension.     Palpations: Abdomen is soft.     Tenderness: There is no abdominal tenderness. There is no guarding.  Musculoskeletal:     Cervical back: Normal range of motion and neck supple.  Skin:    General: Skin is warm.     Findings: No rash.  Neurological:     General: No focal deficit present.     Mental Status: She is alert and oriented to person, place, and time.     Comments: Patient has normal strength and sensation and cranial nerves on exam.  Psychiatric:        Mood and Affect: Mood normal.     ED Results / Procedures / Treatments   Labs (all labs ordered are listed, but only abnormal results are displayed) Labs Reviewed  COMPREHENSIVE METABOLIC PANEL - Abnormal; Notable for the following components:      Result Value   Glucose, Bld 106 (*)    All other components within normal limits  I-STAT CHEM 8, ED - Abnormal; Notable for the following components:   BUN 27 (*)    All other components within normal limits  CBG MONITORING, ED - Abnormal; Notable for the following components:   Glucose-Capillary 108 (*)    All other  components within normal limits  PROTIME-INR  APTT  CBC  DIFFERENTIAL    EKG None  Radiology CT Code Stroke CTA Head W/WO contrast  Result Date: 08/15/2020 CLINICAL DATA:  Left lower extremity weakness EXAM: CT ANGIOGRAPHY HEAD AND NECK TECHNIQUE: Multidetector CT imaging of the head and neck was performed using the standard protocol during bolus administration of intravenous contrast. Multiplanar CT image reconstructions and  MIPs were obtained to evaluate the vascular anatomy. Carotid stenosis measurements (when applicable) are obtained utilizing NASCET criteria, using the distal internal carotid diameter as the denominator. CONTRAST:  75mL OMNIPAQUE IOHEXOL 350 MG/ML SOLN COMPARISON:  07/25/2011 FINDINGS: CTA NECK FINDINGS SKELETON: There is no bony spinal canal stenosis. No lytic or blastic lesion. OTHER NECK: Normal pharynx, larynx and major salivary glands. No cervical lymphadenopathy. Unremarkable thyroid gland. UPPER CHEST: No pneumothorax or pleural effusion. No nodules or masses. AORTIC ARCH: There is no calcific atherosclerosis of the aortic arch. There is no aneurysm, dissection or hemodynamically significant stenosis of the visualized portion of the aorta. Conventional 3 vessel aortic branching pattern. The visualized proximal subclavian arteries are widely patent. RIGHT CAROTID SYSTEM: Normal without aneurysm, dissection or stenosis. LEFT CAROTID SYSTEM: Normal without aneurysm, dissection or stenosis. VERTEBRAL ARTERIES: Left dominant configuration. Both origins are clearly patent. There is no dissection, occlusion or flow-limiting stenosis to the skull base (V1-V3 segments). CTA HEAD FINDINGS POSTERIOR CIRCULATION: --Vertebral arteries: Normal V4 segments. --Inferior cerebellar arteries: Normal. --Basilar artery: Normal. --Superior cerebellar arteries: Normal. --Posterior cerebral arteries (PCA): Normal. ANTERIOR CIRCULATION: --Intracranial internal carotid arteries: Normal. --Anterior  cerebral arteries (ACA): There is multifocal severe stenosis of the left anterior cerebral artery A2 segment. The more proximal stenosis is new. --Middle cerebral arteries (MCA): Normal. VENOUS SINUSES: As permitted by contrast timing, patent. ANATOMIC VARIANTS: None Review of the MIP images confirms the above findings. IMPRESSION: 1. No emergent large vessel occlusion. 2. 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 Aortic Atherosclerosis (ICD10-I70.0). These results were communicated to Dr. Ritta SlotMcNeill Kirkpatrick at 10:25 pm on 08/15/2020 by text page via the [facility] messaging system. Electronically Signed   By: Deatra RobinsonKevin  Herman M.D.   On: 08/15/2020 22:25   CT Code Stroke CTA Neck W/WO contrast  Result Date: 08/15/2020 CLINICAL DATA:  Left lower extremity weakness EXAM: CT ANGIOGRAPHY HEAD AND NECK TECHNIQUE: Multidetector CT imaging of the head and neck was performed using the standard protocol during bolus administration of intravenous contrast. Multiplanar CT image reconstructions and MIPs were obtained to evaluate the vascular anatomy. Carotid stenosis measurements (when applicable) are obtained utilizing NASCET criteria, using the distal internal carotid diameter as the denominator. CONTRAST:  75mL OMNIPAQUE IOHEXOL 350 MG/ML SOLN COMPARISON:  07/25/2011 FINDINGS: CTA NECK FINDINGS SKELETON: There is no bony spinal canal stenosis. No lytic or blastic lesion. OTHER NECK: Normal pharynx, larynx and major salivary glands. No cervical lymphadenopathy. Unremarkable thyroid gland. UPPER CHEST: No pneumothorax or pleural effusion. No nodules or masses. AORTIC ARCH: There is no calcific atherosclerosis of the aortic arch. There is no aneurysm, dissection or hemodynamically significant stenosis of the visualized portion of the aorta. Conventional 3 vessel aortic branching pattern. The visualized proximal subclavian arteries are widely patent.  RIGHT CAROTID SYSTEM: Normal without aneurysm, dissection or stenosis. LEFT CAROTID SYSTEM: Normal without aneurysm, dissection or stenosis. VERTEBRAL ARTERIES: Left dominant configuration. Both origins are clearly patent. There is no dissection, occlusion or flow-limiting stenosis to the skull base (V1-V3 segments). CTA HEAD FINDINGS POSTERIOR CIRCULATION: --Vertebral arteries: Normal V4 segments. --Inferior cerebellar arteries: Normal. --Basilar artery: Normal. --Superior cerebellar arteries: Normal. --Posterior cerebral arteries (PCA): Normal. ANTERIOR CIRCULATION: --Intracranial internal carotid arteries: Normal. --Anterior cerebral arteries (ACA): There is multifocal severe stenosis of the left anterior cerebral artery A2 segment. The more proximal stenosis is new. --Middle cerebral arteries (MCA): Normal. VENOUS SINUSES: As permitted by contrast timing, patent. ANATOMIC VARIANTS: None  Review of the MIP images confirms the above findings. IMPRESSION: 1. No emergent large vessel occlusion. 2. 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 Aortic Atherosclerosis (ICD10-I70.0). These results were communicated to Dr. Ritta Slot at 10:25 pm on 08/15/2020 by text page via the [facility] messaging system. Electronically Signed   By: Deatra Robinson M.D.   On: 08/15/2020 22:25   CT HEAD CODE STROKE WO CONTRAST  Result Date: 08/15/2020 CLINICAL DATA:  Code stroke. Left-sided weakness and tingling. Stroke. EXAM: CT HEAD WITHOUT CONTRAST TECHNIQUE: Contiguous axial images were obtained from the base of the skull through the vertex without intravenous contrast. COMPARISON:  CT head 08/21/2016. FINDINGS: Brain: No evidence of acute infarction, hemorrhage, hydrocephalus, extra-axial collection or mass lesion/mass effect. Vascular: Negative for hyperdense vessel Skull: Negative Sinuses/Orbits: Mild mucosal edema paranasal sinuses. Bilateral  cataract extraction Other: None ASPECTS (Alberta Stroke Program Early CT Score) - Ganglionic level infarction (caudate, lentiform nuclei, internal capsule, insula, M1-M3 cortex): 7 - Supraganglionic infarction (M4-M6 cortex): 3 Total score (0-10 with 10 being normal): 10 IMPRESSION: 1. Negative CT head 2. ASPECTS is 10 3. These results were communicated to Dr. Amada Jupiter at 9:44 pmon 9/2/2021by text page via the [facility] messaging system. Electronically Signed   By: Marlan Palau M.D.   On: 08/15/2020 21:45    Procedures Procedures (including critical care time)  Medications Ordered in ED Medications  sodium chloride flush (NS) 0.9 % injection 3 mL (3 mLs Intravenous Not Given 08/15/20 2219)  iohexol (OMNIPAQUE) 350 MG/ML injection 75 mL (75 mLs Intravenous Contrast Given 08/15/20 2144)    ED Course  I have reviewed the triage vital signs and the nursing notes.  Pertinent labs & imaging results that were available during my care of the patient were reviewed by me and considered in my medical decision making (see chart for details).    MDM Rules/Calculators/A&P                          Patient presents with clinical concern for stroke/TIA.  Fortunately patient signs of improved. Patient presented as code stroke with recent onset time.  Patient taken immediately to CT scan after airway cleared.  CT scan shows significant stenosis, no bleeding.  Discussed with neurology recommends observation/admission to the hospitalist. Blood work reviewed showing normal hemoglobin, normal electrolytes, normal glucose.  Recheck, normal neuro exam, well appearing.  EKG reviewed LVH-like pattern.  Final Clinical Impression(s) / ED Diagnoses Final diagnoses:  TIA (transient ischemic attack)  Left-sided weakness    Rx / DC Orders ED Discharge Orders    None       Blane Ohara, MD 08/15/20 0160    Blane Ohara, MD 08/15/20 2321

## 2020-08-16 ENCOUNTER — Observation Stay (HOSPITAL_COMMUNITY): Payer: Medicare Other

## 2020-08-16 ENCOUNTER — Observation Stay (HOSPITAL_BASED_OUTPATIENT_CLINIC_OR_DEPARTMENT_OTHER): Payer: Medicare Other

## 2020-08-16 DIAGNOSIS — I10 Essential (primary) hypertension: Secondary | ICD-10-CM | POA: Diagnosis not present

## 2020-08-16 DIAGNOSIS — G459 Transient cerebral ischemic attack, unspecified: Secondary | ICD-10-CM

## 2020-08-16 DIAGNOSIS — E785 Hyperlipidemia, unspecified: Secondary | ICD-10-CM

## 2020-08-16 LAB — SARS CORONAVIRUS 2 BY RT PCR (HOSPITAL ORDER, PERFORMED IN ~~LOC~~ HOSPITAL LAB): SARS Coronavirus 2: NEGATIVE

## 2020-08-16 LAB — CBC
HCT: 41.9 % (ref 36.0–46.0)
Hemoglobin: 14 g/dL (ref 12.0–15.0)
MCH: 30.8 pg (ref 26.0–34.0)
MCHC: 33.4 g/dL (ref 30.0–36.0)
MCV: 92.3 fL (ref 80.0–100.0)
Platelets: 207 10*3/uL (ref 150–400)
RBC: 4.54 MIL/uL (ref 3.87–5.11)
RDW: 12.3 % (ref 11.5–15.5)
WBC: 7.1 10*3/uL (ref 4.0–10.5)
nRBC: 0 % (ref 0.0–0.2)

## 2020-08-16 LAB — LIPID PANEL
Cholesterol: 163 mg/dL (ref 0–200)
HDL: 41 mg/dL (ref >40)
LDL Cholesterol: 79 mg/dL (ref 0–99)
Total CHOL/HDL Ratio: 4 RATIO
Triglycerides: 217 mg/dL — ABNORMAL HIGH (ref <150)
VLDL: 43 mg/dL — ABNORMAL HIGH (ref 0–40)

## 2020-08-16 LAB — CREATININE, SERUM
Creatinine, Ser: 0.81 mg/dL (ref 0.44–1.00)
GFR calc Af Amer: >60 mL/min (ref >60)
GFR calc non Af Amer: >60 mL/min (ref >60)

## 2020-08-16 LAB — HEMOGLOBIN A1C
Hgb A1c MFr Bld: 5.4 % (ref 4.8–5.6)
Mean Plasma Glucose: 108.28 mg/dL

## 2020-08-16 LAB — ECHOCARDIOGRAM COMPLETE
Area-P 1/2: 2.91 cm2
S' Lateral: 2.5 cm

## 2020-08-16 LAB — HIV ANTIBODY (ROUTINE TESTING W REFLEX): HIV Screen 4th Generation wRfx: NONREACTIVE

## 2020-08-16 MED ORDER — ATORVASTATIN CALCIUM 40 MG PO TABS: 40.0000 mg | ORAL_TABLET | Freq: Every day | ORAL | 0 refills | Status: DC

## 2020-08-16 MED ORDER — ASPIRIN 81 MG PO TABS: 81.0000 mg | ORAL_TABLET | Freq: Every day | ORAL | 0 refills | Status: AC

## 2020-08-16 MED ORDER — CLOPIDOGREL BISULFATE 75 MG PO TABS: 75.0000 mg | ORAL_TABLET | Freq: Every day | ORAL | 0 refills | Status: AC

## 2020-08-16 MED ORDER — ATORVASTATIN CALCIUM 40 MG PO TABS
40.0000 mg | ORAL_TABLET | Freq: Every day | ORAL | Status: DC
  Administered 2020-08-16: 40 mg via ORAL
  Filled 2020-08-16: qty 1

## 2020-08-16 NOTE — Progress Notes (Signed)
OT Cancellation Note  Patient Details Name: KRISTL MORIOKA MRN: [id number] DOB: 12-16-1950   Cancelled Treatment:    Reason Eval/Treat Not Completed: OT screened, no needs identified, will sign off (back to baseline per PT)  Benancio Osmundson,HILLARY 08/16/2020, 9:23 AM  Luisa Dago, OT/L   Acute OT Clinical Specialist Acute Rehabilitation Services Pager [phone] Office [phone]

## 2020-08-16 NOTE — Progress Notes (Addendum)
STROKE TEAM PROGRESS NOTE   INTERVAL HISTORY Her son is at the bedside.  Patient awake, alert, NAD.  I have personally reviewed history of presenting illness, electronic medical records and imaging films in PACS.  She presented with sudden onset of left face and hand paresthesias of the left leg weakness which lasted about a few hours and recovered yesterday evening.  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  Patient has a remote history of TIA 10 years ago and was on aspirin every day.  She initially expressed interest in participation in the [employer] stroke prevention trial but after reviewing the consent form change her mind and declined Vitals:   08/16/20 0650 08/16/20 0700 08/16/20 1200 08/16/20 1300  BP: 125/74 (!) 149/120 (!) 151/87 (!) 141/75  Pulse: (!) 59 60 65 65  Resp: 17 18 14 13   Temp:      TempSrc:      SpO2: 97% 96% 99% 96%   CBC:  Recent Labs  Lab 08/15/20 2126 08/15/20 2126 08/15/20 2158 08/16/20 0255  WBC 8.5  --   --  7.1  NEUTROABS 4.9  --   --   --   HGB 14.3   < > 14.6 14.0  HCT 42.5   < > 43.0 41.9  MCV 91.0  --   --  92.3  PLT 224  --   --  207   < > = values in this interval not displayed.   Basic Metabolic Panel:  Recent Labs  Lab 08/15/20 2126 08/15/20 2126 08/15/20 2158 08/16/20 0255  NA 139  --  140  --   K 3.8  --  4.0  --   CL 103  --  104  --   CO2 24  --   --   --   GLUCOSE 106*  --  97  --   BUN 23  --  27*  --   CREATININE 0.83   < > 0.80 0.81  CALCIUM 9.5  --   --   --    < > = values in this interval not displayed.   Lipid Panel:  Recent Labs  Lab 08/16/20 0255  CHOL 163  TRIG 217*  HDL 41  CHOLHDL 4.0  VLDL 43*  LDLCALC 79   HgbA1c:  Recent Labs  Lab 08/16/20 0255  HGBA1C 5.4    IMAGING past 24 hours CT Code Stroke CTA Head W/WO contrast  Result Date: 08/15/2020 CLINICAL DATA:  Left lower extremity weakness EXAM: CT ANGIOGRAPHY HEAD AND  NECK TECHNIQUE: Multidetector CT imaging of the head and neck was performed using the standard protocol during bolus administration of intravenous contrast. Multiplanar CT image reconstructions and MIPs were obtained to evaluate the vascular anatomy. Carotid stenosis measurements (when applicable) are obtained utilizing NASCET criteria, using the distal internal carotid diameter as the denominator. CONTRAST:  3mL OMNIPAQUE IOHEXOL 350 MG/ML SOLN COMPARISON:  07/25/2011 FINDINGS: CTA NECK FINDINGS SKELETON: There is no bony spinal canal stenosis. No lytic or blastic lesion. OTHER NECK: Normal pharynx, larynx and major salivary glands. No cervical lymphadenopathy. Unremarkable thyroid gland. UPPER CHEST: No pneumothorax or pleural effusion. No nodules or masses. AORTIC ARCH: There is no calcific atherosclerosis of the aortic arch. There is no aneurysm, dissection or hemodynamically significant stenosis of the visualized portion of the aorta. Conventional 3 vessel aortic branching pattern. The visualized proximal subclavian arteries are widely patent. RIGHT  CAROTID SYSTEM: Normal without aneurysm, dissection or stenosis. LEFT CAROTID SYSTEM: Normal without aneurysm, dissection or stenosis. VERTEBRAL ARTERIES: Left dominant configuration. Both origins are clearly patent. There is no dissection, occlusion or flow-limiting stenosis to the skull base (V1-V3 segments). CTA HEAD FINDINGS POSTERIOR CIRCULATION: --Vertebral arteries: Normal V4 segments. --Inferior cerebellar arteries: Normal. --Basilar artery: Normal. --Superior cerebellar arteries: Normal. --Posterior cerebral arteries (PCA): Normal. ANTERIOR CIRCULATION: --Intracranial internal carotid arteries: Normal. --Anterior cerebral arteries (ACA): There is multifocal severe stenosis of the left anterior cerebral artery A2 segment. The more proximal stenosis is new. --Middle cerebral arteries (MCA): Normal. VENOUS SINUSES: As permitted by contrast timing, patent.  ANATOMIC VARIANTS: None Review of the MIP images confirms the above findings. IMPRESSION: 1. No emergent large vessel occlusion. 2. 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 Aortic Atherosclerosis (ICD10-I70.0). These results were communicated to Dr. Ritta Slot at 10:25 pm on 08/15/2020 by text page via the [facility] messaging system. Electronically Signed   By: Deatra Robinson M.D.   On: 08/15/2020 22:25   DG Chest 2 View  Result Date: 08/16/2020 CLINICAL DATA:  Left-sided weakness, stroke symptoms, previous TIA EXAM: CHEST - 2 VIEW COMPARISON:  03/31/2016 FINDINGS: The heart size and mediastinal contours are within normal limits. Both lungs are clear. The visualized skeletal structures are unremarkable. IMPRESSION: No active cardiopulmonary disease. Electronically Signed   By: Sharlet Salina M.D.   On: 08/16/2020 01:14   CT Code Stroke CTA Neck W/WO contrast  Result Date: 08/15/2020 CLINICAL DATA:  Left lower extremity weakness EXAM: CT ANGIOGRAPHY HEAD AND NECK TECHNIQUE: Multidetector CT imaging of the head and neck was performed using the standard protocol during bolus administration of intravenous contrast. Multiplanar CT image reconstructions and MIPs were obtained to evaluate the vascular anatomy. Carotid stenosis measurements (when applicable) are obtained utilizing NASCET criteria, using the distal internal carotid diameter as the denominator. CONTRAST:  75mL OMNIPAQUE IOHEXOL 350 MG/ML SOLN COMPARISON:  07/25/2011 FINDINGS: CTA NECK FINDINGS SKELETON: There is no bony spinal canal stenosis. No lytic or blastic lesion. OTHER NECK: Normal pharynx, larynx and major salivary glands. No cervical lymphadenopathy. Unremarkable thyroid gland. UPPER CHEST: No pneumothorax or pleural effusion. No nodules or masses. AORTIC ARCH: There is no calcific atherosclerosis of the aortic arch. There is no aneurysm, dissection or  hemodynamically significant stenosis of the visualized portion of the aorta. Conventional 3 vessel aortic branching pattern. The visualized proximal subclavian arteries are widely patent. RIGHT CAROTID SYSTEM: Normal without aneurysm, dissection or stenosis. LEFT CAROTID SYSTEM: Normal without aneurysm, dissection or stenosis. VERTEBRAL ARTERIES: Left dominant configuration. Both origins are clearly patent. There is no dissection, occlusion or flow-limiting stenosis to the skull base (V1-V3 segments). CTA HEAD FINDINGS POSTERIOR CIRCULATION: --Vertebral arteries: Normal V4 segments. --Inferior cerebellar arteries: Normal. --Basilar artery: Normal. --Superior cerebellar arteries: Normal. --Posterior cerebral arteries (PCA): Normal. ANTERIOR CIRCULATION: --Intracranial internal carotid arteries: Normal. --Anterior cerebral arteries (ACA): There is multifocal severe stenosis of the left anterior cerebral artery A2 segment. The more proximal stenosis is new. --Middle cerebral arteries (MCA): Normal. VENOUS SINUSES: As permitted by contrast timing, patent. ANATOMIC VARIANTS: None Review of the MIP images confirms the above findings. IMPRESSION: 1. No emergent large vessel occlusion. 2. 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 Aortic Atherosclerosis (ICD10-I70.0). These results were communicated to Dr. Ritta Slot at 10:25 pm on 08/15/2020 by text page via  the [employer] system. Electronically Signed   By: Deatra Robinson M.D.   On: 08/15/2020 22:25   MR ANGIO HEAD WO CONTRAST  Result Date: 08/16/2020 CLINICAL DATA:  Left lower extremity weakness EXAM: MRI HEAD WITHOUT CONTRAST MRA HEAD WITHOUT CONTRAST TECHNIQUE: Multiplanar, multiecho pulse sequences of the brain and surrounding structures were obtained without intravenous contrast. Angiographic images of the head were obtained using MRA technique without contrast.  COMPARISON:  Brain MRI 08/21/2016 CTA head neck 08/15/2020 FINDINGS: MRI HEAD FINDINGS BRAIN: No acute infarct, acute hemorrhage or extra-axial collection. Normal white matter signal. Normal volume of CSF spaces. No chronic microhemorrhage. Normal midline structures. VASCULAR: Major flow voids are preserved. SKULL AND UPPER CERVICAL SPINE: Normal calvarium and skull base. Visualized upper cervical spine and soft tissues are normal. SINUSES/ORBITS: No paranasal sinus fluid levels or advanced mucosal thickening. No mastoid or middle ear effusion. Normal orbits. MRA HEAD FINDINGS POSTERIOR CIRCULATION: --Vertebral arteries: Normal V4 segments. --Inferior cerebellar arteries: Normal. --Basilar artery: Normal. --Superior cerebellar arteries: Normal. --Posterior cerebral arteries: Normal. ANTERIOR CIRCULATION: --Intracranial internal carotid arteries: Normal. --Anterior cerebral arteries (ACA): Severe multifocal left A2 segment stenosis. Normal right. --Middle cerebral arteries (MCA): Normal. IMPRESSION: 1. No acute intracranial process. 2. Severe multifocal left A2 segment stenosis. Electronically Signed   By: Deatra Robinson M.D.   On: 08/16/2020 02:01   MR BRAIN WO CONTRAST  Result Date: 08/16/2020 CLINICAL DATA:  Left lower extremity weakness EXAM: MRI HEAD WITHOUT CONTRAST MRA HEAD WITHOUT CONTRAST TECHNIQUE: Multiplanar, multiecho pulse sequences of the brain and surrounding structures were obtained without intravenous contrast. Angiographic images of the head were obtained using MRA technique without contrast. COMPARISON:  Brain MRI 08/21/2016 CTA head neck 08/15/2020 FINDINGS: MRI HEAD FINDINGS BRAIN: No acute infarct, acute hemorrhage or extra-axial collection. Normal white matter signal. Normal volume of CSF spaces. No chronic microhemorrhage. Normal midline structures. VASCULAR: Major flow voids are preserved. SKULL AND UPPER CERVICAL SPINE: Normal calvarium and skull base. Visualized upper cervical spine and  soft tissues are normal. SINUSES/ORBITS: No paranasal sinus fluid levels or advanced mucosal thickening. No mastoid or middle ear effusion. Normal orbits. MRA HEAD FINDINGS POSTERIOR CIRCULATION: --Vertebral arteries: Normal V4 segments. --Inferior cerebellar arteries: Normal. --Basilar artery: Normal. --Superior cerebellar arteries: Normal. --Posterior cerebral arteries: Normal. ANTERIOR CIRCULATION: --Intracranial internal carotid arteries: Normal. --Anterior cerebral arteries (ACA): Severe multifocal left A2 segment stenosis. Normal right. --Middle cerebral arteries (MCA): Normal. IMPRESSION: 1. No acute intracranial process. 2. Severe multifocal left A2 segment stenosis. Electronically Signed   By: Deatra Robinson M.D.   On: 08/16/2020 02:01   CT HEAD CODE STROKE WO CONTRAST  Result Date: 08/15/2020 CLINICAL DATA:  Code stroke. Left-sided weakness and tingling. Stroke. EXAM: CT HEAD WITHOUT CONTRAST TECHNIQUE: Contiguous axial images were obtained from the base of the skull through the vertex without intravenous contrast. COMPARISON:  CT head 08/21/2016. FINDINGS: Brain: No evidence of acute infarction, hemorrhage, hydrocephalus, extra-axial collection or mass lesion/mass effect. Vascular: Negative for hyperdense vessel Skull: Negative Sinuses/Orbits: Mild mucosal edema paranasal sinuses. Bilateral cataract extraction Other: None ASPECTS (Alberta Stroke Program Early CT Score) - Ganglionic level infarction (caudate, lentiform nuclei, internal capsule, insula, M1-M3 cortex): 7 - Supraganglionic infarction (M4-M6 cortex): 3 Total score (0-10 with 10 being normal): 10 IMPRESSION: 1. Negative CT head 2. ASPECTS is 10 3. These results were communicated to Dr. Amada Jupiter at 9:44 pmon 9/2/2021by text page via the [facility] messaging system. Electronically Signed   By: Marlan Palau M.D.   On: 08/15/2020 21:45  PHYSICAL EXAM Pleasant middle-aged Caucasian lady not in distress. . Afebrile. Head is nontraumatic. Neck  is supple without bruit.    Cardiac exam no murmur or gallop. Lungs are clear to auscultation. Distal pulses are well felt. Neurological Exam ;  Awake  Alert oriented x 3. Normal speech and language.eye movements full without nystagmus.fundi were not visualized. Vision acuity and fields appear normal. Hearing is normal. Palatal movements are normal. Face symmetric. Tongue midline. Normal strength, tone, reflexes and coordination. Normal sensation. Gait deferred. ASSESSMENT/PLAN Ms. Rod HollerVanessa J Frye is a 69 y.o. female with a history of previous TIA who presents with left-sided weakness that started about 7:30 PM.  She states that she was having some trouble walking, needing to hold onto her husband.  Due to these problems she sought care in the emergency department where she was activated as a code stroke.  At the time of my evaluation, she still had some weakness but had some improvement.  She was evaluated with CT/CTA which was negative  Right brain TIA: Likely small vessel disease  Code Stroke CT head No acute abnormality. ASPECTS 10.     CTA head & neck no LVO  MRI   negative for stroke,  MRA  No LVO ,severe left A2 stenosis  Carotid Doppler  pending  2D Echo 1. Left ventricular ejection fraction, by estimation, is 60 to 65%. The  left ventricle has normal function. The left ventricle has no regional  wall motion abnormalities. Left ventricular diastolic parameters are  consistent with Grade I diastolic  dysfunction (impaired relaxation).  2. Right ventricular systolic function is normal. The right ventricular  size is normal. Tricuspid regurgitation signal is inadequate for assessing  PA pressure.  3. The mitral valve is normal in structure. No evidence of mitral valve  regurgitation. No evidence of mitral stenosis.  4. The aortic valve is normal in structure. Aortic valve regurgitation is  not visualized. No aortic stenosis is present.  5. The inferior vena cava is normal in  size with greater than 50%  respiratory variability, suggesting right atrial pressure of 3 mmHg.   LDL 79  HgbA1c 5.4  VTE prophylaxis - lovenox    Diet   Diet heart healthy/carb modified Room service appropriate? Yes; Fluid consistency: Thin     aspirin 81 mg daily prior to admission, now on aspirin 81 mg daily, clopidogrel 75 mg daily for 3 weeks and then clopidogrel alone   therapy recommendations:  none  Disposition:  home  Hypertension  Home meds:  amlodipine  Stable . Long-term BP goal normotensive  Hyperlipidemia  Home meds:  Atorvastatin 20 mg,   LDL 79, goal < 70  Change to atorvastatin 40 mg daily  Continue statin at discharge  Diabetes type II ontrolled  Home meds:  none  HgbA1c 5.4, goal < 7.0  CBGs Recent Labs    08/15/20 2216  GLUCAP 108*      Other Stroke Risk Factors  Obesity,recommend weight loss, diet and exercise as appropriate   Hx/ TIA   Other Active Problems  Hospital day # 0  Valentina LucksJessica Williams, MSN, NP-C [facility] [phone] I have personally obtained history,examined this patient, reviewed notes, independently viewed imaging studies, participated in medical decision making and plan of care.ROS completed by me personally and pertinent positives fully documented  I have made any additions or clarifications directly to the above note. Agree with note above.  She presented with transient left sided paresthesias and weakness likely from right  hemispheric TIA and MRI scan of the brain negative for acute stroke and MRI of the brain shows asymptomatic left A2 stenosis.  Check carotid ultrasound, echocardiogram, lipid profile and hemoglobin A1c.  Patient initially expressed interest in participation in [employer] stroke prevention trial but later on declined.  Recommend aspirin Plavix for 3 weeks followed by Plavix alone.  Aggressive risk factor modification.  Long discussion with patient and son and answered questions.   Greater than 50% time during this 35-minute visit was spent on counseling and coordination of care about her stroke and TIA risk and answering questions Delia Heady, MD Medical Director [facility] Pager: [phone] 08/16/2020 4:10 PM   To contact Stroke Continuity provider, please refer to [email or web address]. After hours, contact General Neurology

## 2020-08-16 NOTE — Evaluation (Signed)
Physical Therapy Evaluation and Discharge Patient Details Name: Alexis Frye MRN: [id number] DOB: Jul 28, 1951 Today's Date: 08/16/2020   History of Present Illness  Pt is a 69 y/o female admitted secondary to L extremity numbness and weakness. MRI negative. Thought to be secondary to TIA. Symptoms have since resolved. PMH includes HTN and TIA.   Clinical Impression  Patient evaluated by Physical Therapy with no further acute PT needs identified. All education has been completed and the patient has no further questions. Pt overall at an independent level with all mobility tasks. Able to perform DGI tasks without LOB. Educated about "BE FAST" acronym in recognizing CVA symptoms. See below for any follow-up Physical Therapy or equipment needs. PT is signing off. Thank you for this referral. If needs change, please re-consult.      Follow Up Recommendations No PT follow up    Equipment Recommendations  None recommended by PT    Recommendations for Other Services       Precautions / Restrictions Precautions Precautions: None Restrictions Weight Bearing Restrictions: No      Mobility  Bed Mobility Overal bed mobility: Independent                Transfers Overall transfer level: Independent                  Ambulation/Gait Ambulation/Gait assistance: Independent Gait Distance (Feet): 175 Feet Assistive device: None Gait Pattern/deviations: WFL(Within Functional Limits) Gait velocity: WFL    General Gait Details: Good gait speed and steady gait. Able to perform DGI tasks without LOB.   Stairs            Wheelchair Mobility    Modified Rankin (Stroke Patients Only)       Balance Overall balance assessment: Independent                               Standardized Balance Assessment Standardized Balance Assessment : Dynamic Gait Index   Dynamic Gait Index Level Surface: Normal Change in Gait Speed: Normal Gait with Horizontal Head  Turns: Normal Gait with Vertical Head Turns: Normal Gait and Pivot Turn: Normal Step Over Obstacle: Normal Step Around Obstacles: Normal       Pertinent Vitals/Pain Pain Assessment: No/denies pain    Home Living Family/patient expects to be discharged to:: Private residence Living Arrangements: Children;Other (Comment);Spouse/significant other (mother in law) Available Help at Discharge: Family;Available 24 hours/day Type of Home: House Home Access: Stairs to enter;Ramped entrance   Entrance Stairs-Number of Steps: 2 Home Layout: One level Home Equipment: Walker - 2 wheels;Wheelchair - manual;Cane - single point      Prior Function Level of Independence: Independent               Hand Dominance        Extremity/Trunk Assessment   Upper Extremity Assessment Upper Extremity Assessment: LUE deficits/detail LUE Deficits / Details: Reports some hand tightness. Otherwise WFL     Lower Extremity Assessment Lower Extremity Assessment: Overall WFL for tasks assessed    Cervical / Trunk Assessment Cervical / Trunk Assessment: Normal  Communication   Communication: No difficulties  Cognition Arousal/Alertness: Awake/alert Behavior During Therapy: WFL for tasks assessed/performed Overall Cognitive Status: Within Functional Limits for tasks assessed  General Comments General comments (skin integrity, edema, etc.): Educated about "BE FAST" acronym in recognizing CVA symptoms.     Exercises     Assessment/Plan    PT Assessment Patent does not need any further PT services  PT Problem List         PT Treatment Interventions      PT Goals (Current goals can be found in the Care Plan section)  Acute Rehab PT Goals Patient Stated Goal: to go home PT Goal Formulation: With patient Time For Goal Achievement: 08/16/20 Potential to Achieve Goals: Good    Frequency     Barriers to discharge         Co-evaluation               AM-PAC PT "6 Clicks" Mobility  Outcome Measure Help needed turning from your back to your side while in a flat bed without using bedrails?: None Help needed moving from lying on your back to sitting on the side of a flat bed without using bedrails?: None Help needed moving to and from a bed to a chair (including a wheelchair)?: None Help needed standing up from a chair using your arms (e.g., wheelchair or bedside chair)?: None Help needed to walk in hospital room?: None Help needed climbing 3-5 steps with a railing? : None 6 Click Score: 24    End of Session Equipment Utilized During Treatment: Gait belt Activity Tolerance: Patient tolerated treatment well Patient left: in bed;with call bell/phone within reach (in bed in ED) Nurse Communication: Mobility status PT Visit Diagnosis: Other symptoms and signs involving the nervous system (T62.263)    Time: 3354-5625 PT Time Calculation (min) (ACUTE ONLY): 19 min   Charges:   PT Evaluation $PT Eval Low Complexity: 1 Low          Cindee Salt, DPT  Acute Rehabilitation Services  Pager: [phone] Office: [phone]   Alexis Frye 08/16/2020, 10:35 AM

## 2020-08-16 NOTE — ED Notes (Signed)
Patient transported to Echo.

## 2020-08-16 NOTE — Discharge Summary (Signed)
Physician Discharge Summary  Alexis Frye KYH:[id number] DOB: 1951-08-17 DOA: 08/15/2020  PCP: Daisy Floro, MD  Admit date: 08/15/2020 Discharge date: 08/16/2020  Admitted From: Home Disposition: Home  Recommendations for Outpatient Follow-up:  1. Follow up with PCP in 1 week 2. Follow up with neurology in 6 weeks 3. Please follow up on the following pending results: None  Home Health: None Equipment/Devices: None  Discharge Condition: Stable CODE STATUS: Full code Diet recommendation: Heart healthy   Brief/Interim Summary:  Admission HPI written by Fran Lowes, DO   Chief Complaint: Left upper extremity numbness and tingling. Left lower extremity weakness. HPI: The patient is a 69 year old woman who presented to the ED as a code stroke due to the above chief complaints. The patient has a past medical history significant for GERD, headache, hyperlipidemia, and history of TIA. CT of the head was negative for infarct. She has been evaluated by neurology who recommended admission to the hospital for stroke work up. Her deficits have now resolved.   Hospital course:  TIA Left sided facial numbness in addition to left arm/leg numbness and some mild weakness with resolved symptoms. Previous history. MRI without evidence of acute stroke. MRA significant for severe multifocal left A2 segment stenosis and carotid dopplers significant for no significant stenosis. Transthoracic Echocardiogram without evidence of thrombus. Neurology consulted and recommended aspirin 81 mg daily for 21 days in addition to Plavix 75 mg daily. PT/ OT/ Speech therapy without recommendations for outpatient follow-up.  Essential hypertension Continue amlodipine  Hyperlipidemia Increase to Lipitor 40 mg daily  GERD Continue omeprazole  Discharge Diagnoses:  Principal Problem:   Transient ischemic attack (TIA) Active Problems:   HLD (hyperlipidemia)   GERD (gastroesophageal reflux disease)    Essential hypertension    Discharge Instructions   Allergies as of 08/16/2020      Reactions   Codeine Other (See Comments)   unk reaction.       Medication List    TAKE these medications   amLODipine 5 MG tablet Commonly known as: NORVASC Take 5 mg by mouth daily.   aspirin 81 MG tablet Take 1 tablet (81 mg total) by mouth daily for 21 days.   atorvastatin 40 MG tablet Commonly known as: LIPITOR Take 1 tablet (40 mg total) by mouth daily. Start taking on: August 17, 2020 What changed:   medication strength  how much to take   cholecalciferol 1000 units tablet Commonly known as: VITAMIN D Take 1,000 Units by mouth daily.   clopidogrel 75 MG tablet Commonly known as: Plavix Take 1 tablet (75 mg total) by mouth daily.   diphenhydramine-acetaminophen 25-500 MG Tabs tablet Commonly known as: TYLENOL PM Take 1 tablet by mouth at bedtime as needed (sleep).   gabapentin 300 MG capsule Commonly known as: NEURONTIN Take 300 mg by mouth 3 (three) times daily.   loratadine 10 MG tablet Commonly known as: CLARITIN Take 10 mg by mouth daily.   omeprazole 20 MG capsule Commonly known as: PRILOSEC Take 20 mg by mouth daily.   tiZANidine 2 MG tablet Commonly known as: ZANAFLEX Take 1 tablet (2 mg total) by mouth at bedtime.       Allergies  Allergen Reactions  . Codeine Other (See Comments)    unk reaction.     Consultations:  Neurology   Procedures/Studies: CT Code Stroke CTA Head W/WO contrast  Result Date: 08/15/2020 CLINICAL DATA:  Left lower extremity weakness EXAM: CT ANGIOGRAPHY HEAD AND NECK  TECHNIQUE: Multidetector CT imaging of the head and neck was performed using the standard protocol during bolus administration of intravenous contrast. Multiplanar CT image reconstructions and MIPs were obtained to evaluate the vascular anatomy. Carotid stenosis measurements (when applicable) are obtained utilizing NASCET criteria, using the distal internal  carotid diameter as the denominator. CONTRAST:  75mL OMNIPAQUE IOHEXOL 350 MG/ML SOLN COMPARISON:  07/25/2011 FINDINGS: CTA NECK FINDINGS SKELETON: There is no bony spinal canal stenosis. No lytic or blastic lesion. OTHER NECK: Normal pharynx, larynx and major salivary glands. No cervical lymphadenopathy. Unremarkable thyroid gland. UPPER CHEST: No pneumothorax or pleural effusion. No nodules or masses. AORTIC ARCH: There is no calcific atherosclerosis of the aortic arch. There is no aneurysm, dissection or hemodynamically significant stenosis of the visualized portion of the aorta. Conventional 3 vessel aortic branching pattern. The visualized proximal subclavian arteries are widely patent. RIGHT CAROTID SYSTEM: Normal without aneurysm, dissection or stenosis. LEFT CAROTID SYSTEM: Normal without aneurysm, dissection or stenosis. VERTEBRAL ARTERIES: Left dominant configuration. Both origins are clearly patent. There is no dissection, occlusion or flow-limiting stenosis to the skull base (V1-V3 segments). CTA HEAD FINDINGS POSTERIOR CIRCULATION: --Vertebral arteries: Normal V4 segments. --Inferior cerebellar arteries: Normal. --Basilar artery: Normal. --Superior cerebellar arteries: Normal. --Posterior cerebral arteries (PCA): Normal. ANTERIOR CIRCULATION: --Intracranial internal carotid arteries: Normal. --Anterior cerebral arteries (ACA): There is multifocal severe stenosis of the left anterior cerebral artery A2 segment. The more proximal stenosis is new. --Middle cerebral arteries (MCA): Normal. VENOUS SINUSES: As permitted by contrast timing, patent. ANATOMIC VARIANTS: None Review of the MIP images confirms the above findings. IMPRESSION: 1. No emergent large vessel occlusion. 2. 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 Aortic Atherosclerosis (ICD10-I70.0). These results were communicated to Dr. Ritta Slot at 10:25 pm on  08/15/2020 by text page via the [facility] messaging system. Electronically Signed   By: Deatra Robinson M.D.   On: 08/15/2020 22:25   DG Chest 2 View  Result Date: 08/16/2020 CLINICAL DATA:  Left-sided weakness, stroke symptoms, previous TIA EXAM: CHEST - 2 VIEW COMPARISON:  03/31/2016 FINDINGS: The heart size and mediastinal contours are within normal limits. Both lungs are clear. The visualized skeletal structures are unremarkable. IMPRESSION: No active cardiopulmonary disease. Electronically Signed   By: Sharlet Salina M.D.   On: 08/16/2020 01:14   CT Code Stroke CTA Neck W/WO contrast  Result Date: 08/15/2020 CLINICAL DATA:  Left lower extremity weakness EXAM: CT ANGIOGRAPHY HEAD AND NECK TECHNIQUE: Multidetector CT imaging of the head and neck was performed using the standard protocol during bolus administration of intravenous contrast. Multiplanar CT image reconstructions and MIPs were obtained to evaluate the vascular anatomy. Carotid stenosis measurements (when applicable) are obtained utilizing NASCET criteria, using the distal internal carotid diameter as the denominator. CONTRAST:  75mL OMNIPAQUE IOHEXOL 350 MG/ML SOLN COMPARISON:  07/25/2011 FINDINGS: CTA NECK FINDINGS SKELETON: There is no bony spinal canal stenosis. No lytic or blastic lesion. OTHER NECK: Normal pharynx, larynx and major salivary glands. No cervical lymphadenopathy. Unremarkable thyroid gland. UPPER CHEST: No pneumothorax or pleural effusion. No nodules or masses. AORTIC ARCH: There is no calcific atherosclerosis of the aortic arch. There is no aneurysm, dissection or hemodynamically significant stenosis of the visualized portion of the aorta. Conventional 3 vessel aortic branching pattern. The visualized proximal subclavian arteries are widely patent. RIGHT CAROTID SYSTEM: Normal without aneurysm, dissection or stenosis. LEFT CAROTID SYSTEM: Normal without aneurysm, dissection or stenosis. VERTEBRAL ARTERIES: Left  dominant  configuration. Both origins are clearly patent. There is no dissection, occlusion or flow-limiting stenosis to the skull base (V1-V3 segments). CTA HEAD FINDINGS POSTERIOR CIRCULATION: --Vertebral arteries: Normal V4 segments. --Inferior cerebellar arteries: Normal. --Basilar artery: Normal. --Superior cerebellar arteries: Normal. --Posterior cerebral arteries (PCA): Normal. ANTERIOR CIRCULATION: --Intracranial internal carotid arteries: Normal. --Anterior cerebral arteries (ACA): There is multifocal severe stenosis of the left anterior cerebral artery A2 segment. The more proximal stenosis is new. --Middle cerebral arteries (MCA): Normal. VENOUS SINUSES: As permitted by contrast timing, patent. ANATOMIC VARIANTS: None Review of the MIP images confirms the above findings. IMPRESSION: 1. No emergent large vessel occlusion. 2. 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 Aortic Atherosclerosis (ICD10-I70.0). These results were communicated to Dr. Ritta Slot at 10:25 pm on 08/15/2020 by text page via the [facility] messaging system. Electronically Signed   By: Deatra Robinson M.D.   On: 08/15/2020 22:25   MR ANGIO HEAD WO CONTRAST  Result Date: 08/16/2020 CLINICAL DATA:  Left lower extremity weakness EXAM: MRI HEAD WITHOUT CONTRAST MRA HEAD WITHOUT CONTRAST TECHNIQUE: Multiplanar, multiecho pulse sequences of the brain and surrounding structures were obtained without intravenous contrast. Angiographic images of the head were obtained using MRA technique without contrast. COMPARISON:  Brain MRI 08/21/2016 CTA head neck 08/15/2020 FINDINGS: MRI HEAD FINDINGS BRAIN: No acute infarct, acute hemorrhage or extra-axial collection. Normal white matter signal. Normal volume of CSF spaces. No chronic microhemorrhage. Normal midline structures. VASCULAR: Major flow voids are preserved. SKULL AND UPPER CERVICAL SPINE: Normal calvarium and skull  base. Visualized upper cervical spine and soft tissues are normal. SINUSES/ORBITS: No paranasal sinus fluid levels or advanced mucosal thickening. No mastoid or middle ear effusion. Normal orbits. MRA HEAD FINDINGS POSTERIOR CIRCULATION: --Vertebral arteries: Normal V4 segments. --Inferior cerebellar arteries: Normal. --Basilar artery: Normal. --Superior cerebellar arteries: Normal. --Posterior cerebral arteries: Normal. ANTERIOR CIRCULATION: --Intracranial internal carotid arteries: Normal. --Anterior cerebral arteries (ACA): Severe multifocal left A2 segment stenosis. Normal right. --Middle cerebral arteries (MCA): Normal. IMPRESSION: 1. No acute intracranial process. 2. Severe multifocal left A2 segment stenosis. Electronically Signed   By: Deatra Robinson M.D.   On: 08/16/2020 02:01   MR BRAIN WO CONTRAST  Result Date: 08/16/2020 CLINICAL DATA:  Left lower extremity weakness EXAM: MRI HEAD WITHOUT CONTRAST MRA HEAD WITHOUT CONTRAST TECHNIQUE: Multiplanar, multiecho pulse sequences of the brain and surrounding structures were obtained without intravenous contrast. Angiographic images of the head were obtained using MRA technique without contrast. COMPARISON:  Brain MRI 08/21/2016 CTA head neck 08/15/2020 FINDINGS: MRI HEAD FINDINGS BRAIN: No acute infarct, acute hemorrhage or extra-axial collection. Normal white matter signal. Normal volume of CSF spaces. No chronic microhemorrhage. Normal midline structures. VASCULAR: Major flow voids are preserved. SKULL AND UPPER CERVICAL SPINE: Normal calvarium and skull base. Visualized upper cervical spine and soft tissues are normal. SINUSES/ORBITS: No paranasal sinus fluid levels or advanced mucosal thickening. No mastoid or middle ear effusion. Normal orbits. MRA HEAD FINDINGS POSTERIOR CIRCULATION: --Vertebral arteries: Normal V4 segments. --Inferior cerebellar arteries: Normal. --Basilar artery: Normal. --Superior cerebellar arteries: Normal. --Posterior cerebral  arteries: Normal. ANTERIOR CIRCULATION: --Intracranial internal carotid arteries: Normal. --Anterior cerebral arteries (ACA): Severe multifocal left A2 segment stenosis. Normal right. --Middle cerebral arteries (MCA): Normal. IMPRESSION: 1. No acute intracranial process. 2. Severe multifocal left A2 segment stenosis. Electronically Signed   By: Deatra Robinson M.D.   On: 08/16/2020 02:01   ECHOCARDIOGRAM COMPLETE  Result Date: 08/16/2020  ECHOCARDIOGRAM REPORT   Patient Name:   Alexis Frye Date of Exam: 08/16/2020 Medical Rec #:  [id number]       Height:       63.0 in Accession #:    [id number]      Weight:       189.0 lb Date of Birth:  May 19, 1951       BSA:          1.888 m Patient Age:    89 years        BP:           141/75 mmHg Patient Gender: F               HR:           65 bpm. Exam Location:  Inpatient Procedure: 2D Echo, Cardiac Doppler and Color Doppler Indications:    TIA  History:        Patient has no prior history of Echocardiogram examinations.                 TIA; Risk Factors:Hypertension and Dyslipidemia.  Sonographer:    Lavenia AtlasBrooke Strickland Referring Phys: [phone] AVA SWAYZE IMPRESSIONS  1. Left ventricular ejection fraction, by estimation, is 60 to 65%. The left ventricle has normal function. The left ventricle has no regional wall motion abnormalities. Left ventricular diastolic parameters are consistent with Grade I diastolic dysfunction (impaired relaxation).  2. Right ventricular systolic function is normal. The right ventricular size is normal. Tricuspid regurgitation signal is inadequate for assessing PA pressure.  3. The mitral valve is normal in structure. No evidence of mitral valve regurgitation. No evidence of mitral stenosis.  4. The aortic valve is normal in structure. Aortic valve regurgitation is not visualized. No aortic stenosis is present.  5. The inferior vena cava is normal in size with greater than 50% respiratory variability, suggesting right atrial pressure of 3 mmHg.  FINDINGS  Left Ventricle: Left ventricular ejection fraction, by estimation, is 60 to 65%. The left ventricle has normal function. The left ventricle has no regional wall motion abnormalities. The left ventricular internal cavity size was normal in size. There is  no left ventricular hypertrophy. Left ventricular diastolic parameters are consistent with Grade I diastolic dysfunction (impaired relaxation). Normal left ventricular filling pressure. Right Ventricle: The right ventricular size is normal. No increase in right ventricular wall thickness. Right ventricular systolic function is normal. Tricuspid regurgitation signal is inadequate for assessing PA pressure. Left Atrium: Left atrial size was normal in size. Right Atrium: Right atrial size was normal in size. Pericardium: There is no evidence of pericardial effusion. Mitral Valve: The mitral valve is normal in structure. Normal mobility of the mitral valve leaflets. No evidence of mitral valve regurgitation. No evidence of mitral valve stenosis. Tricuspid Valve: The tricuspid valve is normal in structure. Tricuspid valve regurgitation is not demonstrated. No evidence of tricuspid stenosis. Aortic Valve: The aortic valve is normal in structure. Aortic valve regurgitation is not visualized. No aortic stenosis is present. Pulmonic Valve: The pulmonic valve was normal in structure. Pulmonic valve regurgitation is not visualized. No evidence of pulmonic stenosis. Aorta: The aortic root is normal in size and structure. Venous: The inferior vena cava is normal in size with greater than 50% respiratory variability, suggesting right atrial pressure of 3 mmHg. IAS/Shunts: No atrial level shunt detected by color flow Doppler.  LEFT VENTRICLE PLAX 2D LVIDd:         4.40 cm  Diastology LVIDs:  2.50 cm  LV e' lateral:   7.40 cm/s LV PW:         1.20 cm  LV E/e' lateral: 7.6 LV IVS:        1.00 cm  LV e' medial:    6.31 cm/s LVOT diam:     2.00 cm  LV E/e' medial:   8.9 LV SV:         64 LV SV Index:   34 LVOT Area:     3.14 cm  RIGHT VENTRICLE RV Basal diam:  2.90 cm RV S prime:     12.80 cm/s TAPSE (M-mode): 3.7 cm LEFT ATRIUM             Index       RIGHT ATRIUM           Index LA diam:        3.40 cm 1.80 cm/m  RA Area:     13.90 cm LA Vol (A2C):   21.9 ml 11.60 ml/m RA Volume:   35.30 ml  18.70 ml/m LA Vol (A4C):   40.6 ml 21.51 ml/m LA Biplane Vol: 31.9 ml 16.90 ml/m  AORTIC VALVE LVOT Vmax:   94.20 cm/s LVOT Vmean:  57.300 cm/s LVOT VTI:    0.203 m  AORTA Ao Root diam: 2.80 cm MITRAL VALVE MV Area (PHT): 2.91 cm    SHUNTS MV Decel Time: 261 msec    Systemic VTI:  0.20 m MV E velocity: 56.10 cm/s  Systemic Diam: 2.00 cm MV A velocity: 77.10 cm/s MV E/A ratio:  0.73 Mihai Croitoru MD Electronically signed by Thurmon Fair MD Signature Date/Time: 08/16/2020/2:12:53 PM    Final    CT HEAD CODE STROKE WO CONTRAST  Result Date: 08/15/2020 CLINICAL DATA:  Code stroke. Left-sided weakness and tingling. Stroke. EXAM: CT HEAD WITHOUT CONTRAST TECHNIQUE: Contiguous axial images were obtained from the base of the skull through the vertex without intravenous contrast. COMPARISON:  CT head 08/21/2016. FINDINGS: Brain: No evidence of acute infarction, hemorrhage, hydrocephalus, extra-axial collection or mass lesion/mass effect. Vascular: Negative for hyperdense vessel Skull: Negative Sinuses/Orbits: Mild mucosal edema paranasal sinuses. Bilateral cataract extraction Other: None ASPECTS ([place]berta Stroke Program Early CT Score) - Ganglionic level infarction (caudate, lentiform nuclei, internal capsule, insula, M1-M3 cortex): 7 - Supraganglionic infarction (M4-M6 cortex): 3 Total score (0-10 with 10 being normal): 10 IMPRESSION: 1. Negative CT head 2. ASPECTS is 10 3. These results were communicated to Dr. Amada Jupiter at 9:44 pmon 9/2/2021by text page via the [facility] messaging system. Electronically Signed   By: Marlan Palau M.D.   On: 08/15/2020 21:45   VAS US CAROTID (at [facility]  and [facility] only)  Result Date: 08/16/2020 Carotid Arterial Duplex Study Indications:       TIA. Risk Factors:      Hypertension, hyperlipidemia. Comparison Study:  No prior studies. Performing Technologist: Chanda Busing RVT  Examination Guidelines: A complete evaluation includes B-mode imaging, spectral Doppler, color Doppler, and power Doppler as needed of all accessible portions of each vessel. Bilateral testing is considered an integral part of a complete examination. Limited examinations for reoccurring indications may be performed as noted.  Right Carotid Findings: +----------+--------+-------+--------+----------------------+------------------+           PSV cm/sEDV    StenosisPlaque Description    Comments                             cm/s                                                    +----------+--------+-------+--------+----------------------+------------------+  CCA Prox  148     17                                   tortuous           +----------+--------+-------+--------+----------------------+------------------+ CCA Distal64      17                                   intimal thickening +----------+--------+-------+--------+----------------------+------------------+ ICA Prox  56      10             smooth and                                                                heterogenous                             +----------+--------+-------+--------+----------------------+------------------+ ICA Distal74      27                                   tortuous           +----------+--------+-------+--------+----------------------+------------------+ ECA       81      10                                                      +----------+--------+-------+--------+----------------------+------------------+ +----------+--------+-------+--------+-------------------+           PSV cm/sEDV cmsDescribeArm Pressure (mmHG)  +----------+--------+-------+--------+-------------------+ [id number]                                        +----------+--------+-------+--------+-------------------+ +---------+--------+--+--------+-+---------+ VertebralPSV cm/s26EDV cm/s9Antegrade +---------+--------+--+--------+-+---------+  Left Carotid Findings: +----------+--------+-------+--------+----------------------+------------------+           PSV cm/sEDV    StenosisPlaque Description    Comments                             cm/s                                                    +----------+--------+-------+--------+----------------------+------------------+ CCA Prox  93      20                                   intimal thickening +----------+--------+-------+--------+----------------------+------------------+ CCA Distal76      17                                   intimal  thickening +----------+--------+-------+--------+----------------------+------------------+ ICA Prox  62      13             smooth and                                                                heterogenous                             +----------+--------+-------+--------+----------------------+------------------+ ICA Distal65      24                                   tortuous           +----------+--------+-------+--------+----------------------+------------------+ ECA       88      11                                                      +----------+--------+-------+--------+----------------------+------------------+ +----------+--------+--------+--------+-------------------+           PSV cm/sEDV cm/sDescribeArm Pressure (mmHG) +----------+--------+--------+--------+-------------------+ Subclavian190     19                                  +----------+--------+--------+--------+-------------------+ +---------+--------+--+--------+--+---------+ VertebralPSV cm/s56EDV cm/s17Antegrade  +---------+--------+--+--------+--+---------+   Summary: Right Carotid: Velocities in the right ICA are consistent with a 1-39% stenosis. Left Carotid: Velocities in the left ICA are consistent with a 1-39% stenosis. Vertebrals: Bilateral vertebral arteries demonstrate antegrade flow. *See table(s) above for measurements and observations.     Preliminary        Subjective: Some mild abnormal feeling of left hand without actual numbness. Normal strength.  Discharge Exam: Vitals:   08/16/20 1200 08/16/20 1300  BP: (!) 151/87 (!) 141/75  Pulse: 65 65  Resp: 14 13  Temp:    SpO2: 99% 96%   Vitals:   08/16/20 0650 08/16/20 0700 08/16/20 1200 08/16/20 1300  BP: 125/74 (!) 149/120 (!) 151/87 (!) 141/75  Pulse: (!) 59 60 65 65  Resp: 17 18 14 13   Temp:      TempSrc:      SpO2: 97% 96% 99% 96%    General: Pt is alert, awake, not in acute distress Cardiovascular: RRR, S1/S2 +, no rubs, no gallops Respiratory: CTA bilaterally, no wheezing, no rhonchi Abdominal: Soft, NT, ND, bowel sounds + Extremities: no edema, no cyanosis Neuro: CN intact, normal and equal extremity strength, equal reflexes bilaterally.    The results of significant diagnostics from this hospitalization (including imaging, microbiology, ancillary and laboratory) are listed below for reference.     Microbiology: Recent Results (from the past 240 hour(s))  SARS Coronavirus 2 by RT PCR (hospital order, performed in [facility] hospital lab) Nasopharyngeal Nasopharyngeal Swab     Status: None   Collection Time: 08/15/20 11:40 PM   Specimen: Nasopharyngeal Swab  Result Value Ref Range Status   SARS Coronavirus 2 NEGATIVE NEGATIVE Final    Comment: (NOTE) SARS-CoV-2 target nucleic acids  are NOT DETECTED.  The SARS-CoV-2 RNA is generally detectable in upper and lower respiratory specimens during the acute phase of infection. The lowest concentration of SARS-CoV-2 viral copies this assay can detect is 250 copies /  mL. A negative result does not preclude SARS-CoV-2 infection and should not be used as the sole basis for treatment or other patient management decisions.  A negative result may occur with improper specimen collection / handling, submission of specimen other than nasopharyngeal swab, presence of viral mutation(s) within the areas targeted by this assay, and inadequate number of viral copies (<250 copies / mL). A negative result must be combined with clinical observations, patient history, and epidemiological information.  Fact Sheet for Patients:   BoilerBrush.com.cy  Fact Sheet for Healthcare Providers: https://pope.com/  This test is not yet approved or  cleared by the [place] FDA and has been authorized for detection and/or diagnosis of SARS-CoV-2 by FDA under an Emergency Use Authorization (EUA).  This EUA will remain in effect (meaning this test can be used) for the duration of the COVID-19 declaration under Section 564(b)(1) of the Act, 21 U.S.C. section 360bbb-3(b)(1), unless the authorization is terminated or revoked sooner.  Performed at [facility], [address] [place], [place] [address]      Labs: BNP (last 3 results) No results for input(s): BNP in the last 8760 hours. Basic Metabolic Panel: Recent Labs  Lab 08/15/20 2126 08/15/20 2158 08/16/20 0255  NA 139 140  --   K 3.8 4.0  --   CL 103 104  --   CO2 24  --   --   GLUCOSE 106* 97  --   BUN 23 27*  --   CREATININE 0.83 0.80 0.81  CALCIUM 9.5  --   --    Liver Function Tests: Recent Labs  Lab 08/15/20 2126  AST 20  ALT 18  ALKPHOS 95  BILITOT 0.8  PROT 7.1  ALBUMIN 4.5   No results for input(s): LIPASE, AMYLASE in the last 168 hours. No results for input(s): AMMONIA in the last 168 hours. CBC: Recent Labs  Lab 08/15/20 2126 08/15/20 2158 08/16/20 0255  WBC 8.5  --  7.1  NEUTROABS 4.9  --   --   HGB 14.3 14.6 14.0  HCT 42.5  43.0 41.9  MCV 91.0  --  92.3  PLT 224  --  207   Cardiac Enzymes: No results for input(s): CKTOTAL, CKMB, CKMBINDEX, TROPONINI in the last 168 hours. BNP: Invalid input(s): POCBNP CBG: Recent Labs  Lab 08/15/20 2216  GLUCAP 108*   D-Dimer No results for input(s): DDIMER in the last 72 hours. Hgb A1c Recent Labs    08/16/20 0255  HGBA1C 5.4   Lipid Profile Recent Labs    08/16/20 0255  CHOL 163  HDL 41  LDLCALC 79  TRIG 217*  CHOLHDL 4.0   Thyroid function studies No results for input(s): TSH, T4TOTAL, T3FREE, THYROIDAB in the last 72 hours.  Invalid input(s): FREET3 Anemia work up No results for input(s): VITAMINB12, FOLATE, FERRITIN, TIBC, IRON, RETICCTPCT in the last 72 hours. Urinalysis    Component Value Date/Time   COLORURINE YELLOW 08/21/2016 0850   APPEARANCEUR CLEAR 08/21/2016 0850   LABSPEC 1.010 08/21/2016 0850   PHURINE 7.0 08/21/2016 0850   GLUCOSEU NEGATIVE 08/21/2016 0850   HGBUR NEGATIVE 08/21/2016 0850   BILIRUBINUR NEGATIVE 08/21/2016 0850   KETONESUR NEGATIVE 08/21/2016 0850   PROTEINUR NEGATIVE 08/21/2016 0850   UROBILINOGEN 0.2 07/25/2011  0421   NITRITE NEGATIVE 08/21/2016 0850   LEUKOCYTESUR NEGATIVE 08/21/2016 0850   Sepsis Labs Invalid input(s): PROCALCITONIN,  WBC,  LACTICIDVEN Microbiology Recent Results (from the past 240 hour(s))  SARS Coronavirus 2 by RT PCR (hospital order, performed in [facility] hospital lab) Nasopharyngeal Nasopharyngeal Swab     Status: None   Collection Time: 08/15/20 11:40 PM   Specimen: Nasopharyngeal Swab  Result Value Ref Range Status   SARS Coronavirus 2 NEGATIVE NEGATIVE Final    Comment: (NOTE) SARS-CoV-2 target nucleic acids are NOT DETECTED.  The SARS-CoV-2 RNA is generally detectable in upper and lower respiratory specimens during the acute phase of infection. The lowest concentration of SARS-CoV-2 viral copies this assay can detect is 250 copies / mL. A negative result does not  preclude SARS-CoV-2 infection and should not be used as the sole basis for treatment or other patient management decisions.  A negative result may occur with improper specimen collection / handling, submission of specimen other than nasopharyngeal swab, presence of viral mutation(s) within the areas targeted by this assay, and inadequate number of viral copies (<250 copies / mL). A negative result must be combined with clinical observations, patient history, and epidemiological information.  Fact Sheet for Patients:   BoilerBrush.com.cy  Fact Sheet for Healthcare Providers: https://pope.com/  This test is not yet approved or  cleared by the [place] FDA and has been authorized for detection and/or diagnosis of SARS-CoV-2 by FDA under an Emergency Use Authorization (EUA).  This EUA will remain in effect (meaning this test can be used) for the duration of the COVID-19 declaration under Section 564(b)(1) of the Act, 21 U.S.C. section 360bbb-3(b)(1), unless the authorization is terminated or revoked sooner.  Performed at [facility], [address] [place], [place] [address]     SIGNED:  Jacquelin Hawking, MD [facility] 08/16/2020, 2:57 PM

## 2020-08-16 NOTE — Progress Notes (Signed)
Carotid artery duplex has been completed. Preliminary results can be found in CV Proc through chart review.   08/16/20 2:37 PM Olen Cordial RVT

## 2020-08-16 NOTE — H&P (Signed)
Alexis Frye is an 69 y.o. female.   Chief Complaint: Left upper extremity numbness and tingling. Left lower extremity weakness. HPI: The patient is a 69 year old woman who presented to the ED as a code stroke due to the above chief complaints. The patient has a past medical history significant for GERD, headache, hyperlipidemia, and history of TIA. CT of the head was negative for infarct. She has been evaluated by neurology who recommended admission to the hospital for stroke work up. Her deficits have now resolved.  The patient denies, fevers, chills, cough, chest pain, shortness of breath, nausea, vomiting, diarrhea, abdominal pain, no lesions, sores, or rashes.  Past Medical History:  Diagnosis Date  . Arm pain left  . Arthritis   . GERD (gastroesophageal reflux disease)   . Headache   . Hyperlipemia   . PONV (postoperative nausea and vomiting)   . Seasonal allergies   . TIA (transient ischemic attack) 2012   no residual  . Wears glasses     Past Surgical History:  Procedure Laterality Date  . ABDOMINAL HYSTERECTOMY    . CARDIAC CATHETERIZATION N/A 04/01/2016   Procedure: Left Heart Cath and Coronary Angiography;  Surgeon: Peter M Swaziland, MD;  Location: [facility] INVASIVE CV LAB;  Service: Cardiovascular;  Laterality: N/A;  . CESAREAN SECTION     x2  . CHOLECYSTECTOMY    . COLONOSCOPY    . DIAGNOSTIC LAPAROSCOPY    . DILATION AND CURETTAGE OF UTERUS    . LARYNGOSCOPY     vocal cord polyps  . SHOULDER ARTHROSCOPY WITH ROTATOR CUFF REPAIR Left 11/26/2014   Procedure: LEFT SHOULDER ARTHROSCOPY W/ SAD/DCR (no cuff repair);  Surgeon: Nestor Lewandowsky, MD;  Location: [facility];  Service: Orthopedics;  Laterality: Left;  . TONSILLECTOMY      Family History  Problem Relation Age of Onset  . Emphysema Mother   . Heart disease Mother   . Osteoporosis Mother   . Alcoholism Father   . Heart attack Sister 22       smoker  . Heart attack Sister 27       smoker   Social  History:  reports that she has never smoked. She has never used smokeless tobacco. She reports current alcohol use. She reports that she does not use drugs. (Not in a hospital admission)   Allergies:  Allergies  Allergen Reactions  . Codeine Other (See Comments)    unk reaction.     Pertinent items noted in HPI and remainder of comprehensive ROS otherwise negative.  General appearance: alert, cooperative, no acute distress Head: Normocephalic, without obvious abnormality, atraumatic Eyes: conjunctivae/corneas clear. PERRL, EOM's intact. Fundi benign. Throat: lips, mucosa, and tongue normal; teeth and gums normal Neck: no adenopathy, no carotid bruit, no JVD, supple, symmetrical, trachea midline and thyroid not enlarged, symmetric, no tenderness/mass/nodules Resp: No increased work of breathing, no wheezes, rales, or rhonchi. No tactile fremitus. Chest wall: no tenderness Cardio: regular rate and rhythm, S1, S2 normal, no murmur, click, rub or gallop GI: soft, non-tender; bowel sounds normal; no masses,  no organomegaly Extremities: extremities normal, atraumatic, no cyanosis or edema Pulses: 2+ and symmetric Skin: Skin color, texture, turgor normal. No rashes or lesions Lymph nodes: Cervical, supraclavicular, and axillary nodes normal. Neurologic: Alert and oriented X 3, normal strength and tone. Normal symmetric reflexes. Normal coordination and gait   Results for orders placed or performed during the hospital encounter of 08/15/20 (from the past 48 hour(s))  Protime-INR     Status: None   Collection Time: 08/15/20  9:26 PM  Result Value Ref Range   Prothrombin Time 11.9 11.4 - 15.2 seconds   INR 0.9 0.8 - 1.2    Comment: (NOTE) INR goal varies based on device and disease states. Performed at [facility], [address] [place], [place] [address]   APTT     Status: None   Collection Time: 08/15/20  9:26 PM  Result Value Ref Range   aPTT 32 24 - 36 seconds     Comment: Performed at [facility], [address] [place], [place] [address]  CBC     Status: None   Collection Time: 08/15/20  9:26 PM  Result Value Ref Range   WBC 8.5 4.0 - 10.5 K/uL   RBC 4.67 3.87 - 5.11 MIL/uL   Hemoglobin 14.3 12.0 - 15.0 g/dL   HCT 82.942.5 36 - 46 %   MCV 91.0 80.0 - 100.0 fL   MCH 30.6 26.0 - 34.0 pg   MCHC 33.6 30.0 - 36.0 g/dL   RDW 56.212.2 13.011.5 - 86.515.5 %   Platelets 224 150 - 400 K/uL   nRBC 0.0 0.0 - 0.2 %    Comment: Performed at [facility], [address] [place], [place] [address]  Differential     Status: None   Collection Time: 08/15/20  9:26 PM  Result Value Ref Range   Neutrophils Relative % 56 %   Neutro Abs 4.9 1.7 - 7.7 K/uL   Lymphocytes Relative 36 %   Lymphs Abs 3.0 0.7 - 4.0 K/uL   Monocytes Relative 6 %   Monocytes Absolute 0.5 0 - 1 K/uL   Eosinophils Relative 1 %   Eosinophils Absolute 0.1 0 - 0 K/uL   Basophils Relative 1 %   Basophils Absolute 0.0 0 - 0 K/uL   Immature Granulocytes 0 %   Abs Immature Granulocytes 0.02 0.00 - 0.07 K/uL    Comment: Performed at [facility], [address] [place], [place] [address]  Comprehensive metabolic panel     Status: Abnormal   Collection Time: 08/15/20  9:26 PM  Result Value Ref Range   Sodium 139 135 - 145 mmol/L   Potassium 3.8 3.5 - 5.1 mmol/L   Chloride 103 98 - 111 mmol/L   CO2 24 22 - 32 mmol/L   Glucose, Bld 106 (H) 70 - 99 mg/dL    Comment: Glucose reference range applies only to samples taken after fasting for at least 8 hours.   BUN 23 8 - 23 mg/dL   Creatinine, Ser 8.410.83 0.44 - 1.00 mg/dL   Calcium 9.5 8.9 - 32.410.3 mg/dL   Total Protein 7.1 6.5 - 8.1 g/dL   Albumin 4.5 3.5 - 5.0 g/dL   AST 20 15 - 41 U/L   ALT 18 0 - 44 U/L   Alkaline Phosphatase 95 38 - 126 U/L   Total Bilirubin 0.8 0.3 - 1.2 mg/dL   GFR calc non Af Amer >60 >60 mL/min   GFR calc Af Amer >60 >60 mL/min   Anion gap 12 5 - 15    Comment: Performed at [facility], [address] [place], [place] [address]  I-stat chem 8, ED     Status: Abnormal   Collection Time: 08/15/20  9:58 PM  Result Value Ref Range   Sodium 140 135 - 145 mmol/L   Potassium 4.0  3.5 - 5.1 mmol/L   Chloride 104 98 - 111 mmol/L   BUN 27 (H) 8 - 23 mg/dL   Creatinine, Ser 3.15 0.44 - 1.00 mg/dL   Glucose, Bld 97 70 - 99 mg/dL    Comment: Glucose reference range applies only to samples taken after fasting for at least 8 hours.   Calcium, Ion 1.18 1.15 - 1.40 mmol/L   TCO2 24 22 - 32 mmol/L   Hemoglobin 14.6 12.0 - 15.0 g/dL   HCT 17.6 36 - 46 %  CBG monitoring, ED     Status: Abnormal   Collection Time: 08/15/20 10:16 PM  Result Value Ref Range   Glucose-Capillary 108 (H) 70 - 99 mg/dL    Comment: Glucose reference range applies only to samples taken after fasting for at least 8 hours.  SARS Coronavirus 2 by RT PCR (hospital order, performed in [facility] hospital lab) Nasopharyngeal Nasopharyngeal Swab     Status: None   Collection Time: 08/15/20 11:40 PM   Specimen: Nasopharyngeal Swab  Result Value Ref Range   SARS Coronavirus 2 NEGATIVE NEGATIVE    Comment: (NOTE) SARS-CoV-2 target nucleic acids are NOT DETECTED.  The SARS-CoV-2 RNA is generally detectable in upper and lower respiratory specimens during the acute phase of infection. The lowest concentration of SARS-CoV-2 viral copies this assay can detect is 250 copies / mL. A negative result does not preclude SARS-CoV-2 infection and should not be used as the sole basis for treatment or other patient management decisions.  A negative result may occur with improper specimen collection / handling, submission of specimen other than nasopharyngeal swab, presence of viral mutation(s) within the areas targeted by this assay, and inadequate number of viral copies (<250 copies / mL). A negative result must be combined with clinical observations, patient history, and epidemiological information.  Fact Sheet for Patients:    BoilerBrush.com.cy  Fact Sheet for Healthcare Providers: https://pope.com/  This test is not yet approved or  cleared by the [place] FDA and has been authorized for detection and/or diagnosis of SARS-CoV-2 by FDA under an Emergency Use Authorization (EUA).  This EUA will remain in effect (meaning this test can be used) for the duration of the COVID-19 declaration under Section 564(b)(1) of the Act, 21 U.S.C. section 360bbb-3(b)(1), unless the authorization is terminated or revoked sooner.  Performed at [facility], [address] [place], [place] [address]    [email or web address] @  Blood pressure 116/71, pulse (!) 49, temperature 98 F (36.7 C), temperature source Oral, resp. rate 13, SpO2 95 %.   Assessment/Plan Problem  Transient Ischemic Attack (Tia)  Essential Hypertension  Hld (Hyperlipidemia)  Gerd (Gastroesophageal Reflux Disease)   TIA: The patient will be admitted to a telemetry bed. She will have a stroke work up and a PT/OT eval. Neurology will also be consulted in the am. The patient states that she has a history or prior TIA.   Essential Hypertension: Pt states that this is actually just "white coat" syndrome. Her blood pressures here are on the low side. We will continue to monitor them. She is on norvasc 5 mg daily at home. This is being held due to low blood pressures and the need to take a lenient approach to hypertension in this period.  Hyperlipidemia: Continue lipitor as at home. Check lipid panel.  GERD: Continue protonix as at home.  I have seen and examined this patient myself. I have spent 77 minutes in her evaluation and admission  DVT  prophylaxis: Lovenox CODE STATUS: Full Code Family Communication: none available Disposition: The patient has been admitted as observation to a telemetry bed.   Meer Reindl 08/16/2020, 2:16 AM

## 2020-08-16 NOTE — Progress Notes (Signed)
Nsg Discharge Note  Admit Date:  08/15/2020 Discharge date: 08/16/2020   [name] to be D/C'd Home per MD order.  AVS completed.  Copy for chart, and copy for patient signed, and dated. Patient/caregiver able to verbalize understanding.  Discharge Medication: Allergies as of 08/16/2020      Reactions   Codeine Other (See Comments)   unk reaction.       Medication List    TAKE these medications   amLODipine 5 MG tablet Commonly known as: NORVASC Take 5 mg by mouth daily.   aspirin 81 MG tablet Take 1 tablet (81 mg total) by mouth daily for 21 days.   atorvastatin 40 MG tablet Commonly known as: LIPITOR Take 1 tablet (40 mg total) by mouth daily. Start taking on: August 17, 2020 What changed:   medication strength  how much to take   cholecalciferol 1000 units tablet Commonly known as: VITAMIN D Take 1,000 Units by mouth daily.   clopidogrel 75 MG tablet Commonly known as: Plavix Take 1 tablet (75 mg total) by mouth daily.   diphenhydramine-acetaminophen 25-500 MG Tabs tablet Commonly known as: TYLENOL PM Take 1 tablet by mouth at bedtime as needed (sleep).   gabapentin 300 MG capsule Commonly known as: NEURONTIN Take 300 mg by mouth 3 (three) times daily.   loratadine 10 MG tablet Commonly known as: CLARITIN Take 10 mg by mouth daily.   omeprazole 20 MG capsule Commonly known as: PRILOSEC Take 20 mg by mouth daily.   tiZANidine 2 MG tablet Commonly known as: ZANAFLEX Take 1 tablet (2 mg total) by mouth at bedtime.       Discharge Assessment: Vitals:   08/16/20 1300 08/16/20 1530  BP: (!) 141/75 (!) 168/88  Pulse: 65 67  Resp: 13 16  Temp:  98.2 F (36.8 C)  SpO2: 96% 97%   Skin clean, dry and intact without evidence of skin break down, no evidence of skin tears noted. IV catheter discontinued intact. Site without signs and symptoms of complications - no redness or edema noted at insertion site, patient denies c/o pain - only slight  tenderness at site.  Dressing with slight pressure applied.  D/c Instructions-Education: Discharge instructions given to patient/family with verbalized understanding. D/c education completed with patient/family including follow up instructions, medication list, d/c activities limitations if indicated, with other d/c instructions as indicated by MD - patient able to verbalize understanding, all questions fully answered. Patient instructed to return to ED, call 911, or call MD for any changes in condition.  Patient escorted via WC, and D/C home via private auto.  Boykin Nearing, RN 08/16/2020 3:46 PM

## 2020-08-16 NOTE — Progress Notes (Signed)
  Echocardiogram 2D Echocardiogram has been performed.  Pieter Partridge 08/16/2020, 2:07 PM

## 2020-08-16 NOTE — Consult Note (Signed)
Neurology Consultation Reason for Consult: Left-sided weakness Referring Physician: Abran Duke  CC: Left-sided weakness  History is obtained from: Patient  HPI: Alexis Frye is a 69 y.o. female with a history of previous TIA who presents with left-sided weakness that started about 7:30 PM.  She states that she was having some trouble walking, needing to hold onto her husband.  Due to these problems she sought care in the emergency department where she was activated as a code stroke.  At the time of my evaluation, she still had some weakness but had some improvement.  She was evaluated with CT/CTA which was negative.  Due to the fact that her symptoms were relatively mild, she is not a candidate for IV TPA which I discussed with the patient who expressed understanding.   LKW: 7:30 PM tpa given?: no, mild symptoms  ROS: A 14 point ROS was performed and is negative except as noted in the HPI.   Past Medical History:  Diagnosis Date  . Arm pain left  . Arthritis   . GERD (gastroesophageal reflux disease)   . Headache   . Hyperlipemia   . PONV (postoperative nausea and vomiting)   . Seasonal allergies   . TIA (transient ischemic attack) 2012   no residual  . Wears glasses      Family History  Problem Relation Age of Onset  . Emphysema Mother   . Heart disease Mother   . Osteoporosis Mother   . Alcoholism Father   . Heart attack Sister 68       smoker  . Heart attack Sister 47       smoker     Social History:  reports that she has never smoked. She has never used smokeless tobacco. She reports current alcohol use. She reports that she does not use drugs.   Exam: Current vital signs: BP 119/73   Pulse (!) 54   Temp 98 F (36.7 C) (Oral)   Resp 17   SpO2 96%  Vital signs in last 24 hours: Temp:  [98 F (36.7 C)] 98 F (36.7 C) (09/02 2120) Pulse Rate:  [54-67] 54 (09/02 2330) Resp:  [13-19] 17 (09/02 2330) BP: (118-146)/(69-95) 119/73 (09/02 2330) SpO2:  [95  %-97 %] 96 % (09/02 2330)   Physical Exam  Constitutional: Appears well-developed and well-nourished.  Psych: Affect appropriate to situation Eyes: No scleral injection HENT: No OP obstrucion MSK: no joint deformities.  Cardiovascular: Normal rate and regular rhythm.  Respiratory: Effort normal, non-labored breathing GI: Soft.  No distension. There is no tenderness.  Skin: WDI  Neuro: Mental Status: Patient is awake, alert, oriented to person, place, month, year, and situation. Patient is able to give a clear and coherent history. No signs of aphasia or neglect Cranial Nerves: II: Visual Fields are full. Pupils are equal, round, and reactive to light.   III,IV, VI: EOMI without ptosis or diploplia.  V: Facial sensation is symmetric to temperature VII: Facial movement is symmetric.  VIII: hearing is intact to voice X: Uvula elevates symmetrically XI: Shoulder shrug is symmetric. XII: tongue is midline without atrophy or fasciculations.  Motor: Tone is normal. Bulk is normal.  She has good strength in bilateral upper extremities, she has 4/5 strength of the left lower extremity drift Sensory: Sensation is diminished in the left leg otherwise intact Cerebellar: FNF intact bilaterally Gait: She is able to walk unassisted with steady gait     I have reviewed labs in epic and the  results pertinent to this consultation are: CMP-unremarkable  I have reviewed the images obtained: CT head-unremarkable  Impression: 69 year old female with a history of TIA, hyperlipidemia who presents with left-sided weakness which is improving.  I suspect that this represents either mild ischemic stroke versus TIA.  She will need to be admitted for secondary risk factor modification.  Recommendations: - HgbA1c, fasting lipid panel - MRI, MRA  of the brain without contrast - Frequent neuro checks - Echocardiogram - Prophylactic therapy-Antiplatelet med: Aspirin - dose 325mg  PO or 300mg  PR -  Risk factor modification - Telemetry monitoring - PT consult, OT consult, Speech consult - Stroke team to follow    , MD [facility] [phone]  If 7pm- 7am, please page neurology on call as listed in AMION.

## 2020-10-28 ENCOUNTER — Ambulatory Visit: Payer: Medicare Other | Admitting: Adult Health

## 2020-10-28 ENCOUNTER — Inpatient Hospital Stay: Payer: Medicare Other | Admitting: Neurology

## 2020-10-28 ENCOUNTER — Encounter: Payer: Self-pay | Admitting: Adult Health

## 2020-10-28 VITALS — BP 131/76 | HR 68 | Ht 63.0 in | Wt 181.0 lb

## 2020-10-28 DIAGNOSIS — E785 Hyperlipidemia, unspecified: Secondary | ICD-10-CM

## 2020-10-28 DIAGNOSIS — I10 Essential (primary) hypertension: Secondary | ICD-10-CM | POA: Diagnosis not present

## 2020-10-28 DIAGNOSIS — G459 Transient cerebral ischemic attack, unspecified: Secondary | ICD-10-CM | POA: Diagnosis not present

## 2020-10-28 MED ORDER — ASPIRIN EC 325 MG PO TBEC: 325.0000 mg | DELAYED_RELEASE_TABLET | Freq: Every day | ORAL | 0 refills | Status: DC

## 2020-10-28 NOTE — Progress Notes (Signed)
[facility] [address] [place]. Alexis Frye [address] [phone]       HOSPITAL FOLLOW UP NOTE  Alexis Frye Date of Birth:  August 14, 1951 Medical Record Number:  [id number]   Reason for Referral:  hospital TIA follow up    SUBJECTIVE:   CHIEF COMPLAINT:  Chief Complaint  Patient presents with  . Hospitalization Follow-up    rm 9, alone, pt states she is feeling well, would like to stop Plavix     HPI:   Alexis Frye is a 69 y.o. female with a history of previous TIA who presented on 08/15/2020 with sudden onset left facial and hand paresthesias and left leg weakness lasting a few hours and then resolved. Personally reviewed hospitalization pertinent progress notes, lab work and imaging with summary provided.  Evaluated by Dr. 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  Previously on aspirin and recommended DAPT for 3 weeks then Plavix alone. Hx of HTN on amlodipine which was resumed at discharge. Hx of HLD on atorvastatin with LDL 79 and recommended increasing dosage to 40 mg daily.  Controlled DM with A1c 5.4.  Other stroke risk factors include obesity and history of TIA.  Evaluated by therapies with resolution of deficits and discharged home without therapy needs.  Right brain TIA: Likely small vessel disease  Code Stroke CT head No acute abnormality. ASPECTS 10.     CTA head & neck no LVO  MRI   negative for stroke,  MRA  No LVO ,severe left A2 stenosis  Carotid Doppler  b/l ICA 1 to 39% stenosis  2D Echo  EF 60 to 65%  LDL 79  HgbA1c 5.4  aspirin 81 mg daily prior to admission, now on aspirin 81 mg daily, clopidogrel 75 mg daily for 3 weeks and then clopidogrel alone   therapy recommendations:  none  Disposition:  home   Today, 10/28/2020, Ms. Alexis Frye is being seen for hospital follow-up.  She has been doing well since discharge without new  or reoccurring stroke/TIA symptoms.  She has remained on aspirin and Plavix despite 3-week recommendation with mild bruising but no bleeding.  She questions ongoing need of Plavix due to family history previously on Plavix with excessive bruising and bleeding.  Remains on atorvastatin 40 mg daily without myalgias.  Blood pressure today 131/76.  Routinely monitors at home which has been stable.  No concerns at this time.    ROS:   14 system review of systems performed and negative with exception of no complaints  PMH:  Past Medical History:  Diagnosis Date  . Arm pain left  . Arthritis   . GERD (gastroesophageal reflux disease)   . Headache   . Hyperlipemia   . PONV (postoperative nausea and vomiting)   . Seasonal allergies   . TIA (transient ischemic attack) 2012   no residual  . Wears glasses     PSH:  Past Surgical History:  Procedure Laterality Date  . ABDOMINAL HYSTERECTOMY    . CARDIAC CATHETERIZATION N/A 04/01/2016   Procedure: Left Heart Cath and Coronary Angiography;  Surgeon: Peter M Swaziland, MD;  Location: [facility] INVASIVE CV LAB;  Service: Cardiovascular;  Laterality: N/A;  . CESAREAN SECTION     x2  . CHOLECYSTECTOMY    . COLONOSCOPY    . DIAGNOSTIC LAPAROSCOPY    . DILATION AND CURETTAGE OF UTERUS    . LARYNGOSCOPY  vocal cord polyps  . SHOULDER ARTHROSCOPY WITH ROTATOR CUFF REPAIR Left 11/26/2014   Procedure: LEFT SHOULDER ARTHROSCOPY W/ SAD/DCR (no cuff repair);  Surgeon: Nestor LewandowskyFrank J Rowan, MD;  Location: [facility];  Service: Orthopedics;  Laterality: Left;  . TONSILLECTOMY      Social History:  Social History   Socioeconomic History  . Marital status: Married    Spouse name: Not on file  . Number of children: 2  . Years of education: 5216  . Highest education level: Not on file  Occupational History  . Occupation: Education administratorystems Director of Finanacial Aid    Comment: [employer]  Tobacco Use  . Smoking status: Never Smoker  . Smokeless  tobacco: Never Used  Substance and Sexual Activity  . Alcohol use: Yes    Alcohol/week: 0.0 standard drinks    Comment: Occasional wine  . Drug use: No  . Sexual activity: Not on file  Other Topics Concern  . Not on file  Social History Narrative   Lives at home with her husband and mother-in-law.   Right-handed.   2 cups caffeine per day.   Social Determinants of Health   Financial Resource Strain:   . Difficulty of Paying Living Expenses: Not on file  Food Insecurity:   . Worried About Programme researcher, broadcasting/film/videounning Out of Food in the Last Year: Not on file  . Ran Out of Food in the Last Year: Not on file  Transportation Needs:   . Lack of Transportation (Medical): Not on file  . Lack of Transportation (Non-Medical): Not on file  Physical Activity:   . Days of Exercise per Week: Not on file  . Minutes of Exercise per Session: Not on file  Stress:   . Feeling of Stress : Not on file  Social Connections:   . Frequency of Communication with Friends and Family: Not on file  . Frequency of Social Gatherings with Friends and Family: Not on file  . Attends Religious Services: Not on file  . Active Member of Clubs or Organizations: Not on file  . Attends BankerClub or Organization Meetings: Not on file  . Marital Status: Not on file  Intimate Partner Violence:   . Fear of Current or Ex-Partner: Not on file  . Emotionally Abused: Not on file  . Physically Abused: Not on file  . Sexually Abused: Not on file    Family History:  Family History  Problem Relation Age of Onset  . Emphysema Mother   . Heart disease Mother   . Osteoporosis Mother   . Alcoholism Father   . Heart attack Sister [phone]       smoker  . Heart attack Sister 6159       smoker    Medications:   Current Outpatient Medications on File Prior to Visit  Medication Sig Dispense Refill  . amLODipine (NORVASC) 5 MG tablet Take 5 mg by mouth daily.   1  . calcium gluconate 500 MG tablet Take 1 tablet by mouth daily. 600 mg    .  cholecalciferol (VITAMIN D) 1000 UNITS tablet Take 1,000 Units by mouth daily.    . clopidogrel (PLAVIX) 75 MG tablet Take 75 mg by mouth daily.    . diphenhydramine-acetaminophen (TYLENOL PM) 25-500 MG TABS tablet Take 1 tablet by mouth at bedtime as needed (sleep).    . gabapentin (NEURONTIN) 300 MG capsule Take 300 mg by mouth 3 (three) times daily.    Marland Kitchen. loratadine (CLARITIN) 10 MG tablet Take 10 mg  by mouth daily.    Marland Kitchen omeprazole (PRILOSEC) 20 MG capsule Take 20 mg by mouth daily.    Marland Kitchen tiZANidine (ZANAFLEX) 2 MG tablet Take 1 tablet (2 mg total) by mouth at bedtime. 90 tablet 3  . atorvastatin (LIPITOR) 40 MG tablet Take 1 tablet (40 mg total) by mouth daily. 30 tablet 0   No current facility-administered medications on file prior to visit.    Allergies:   Allergies  Allergen Reactions  . Codeine Other (See Comments)    unk reaction.       OBJECTIVE:  Physical Exam  Vitals:   10/28/20 1455  BP: 131/76  Pulse: 68  Weight: 181 lb (82.1 kg)  Height: 5\' 3"  (1.6 m)   Body mass index is 32.06 kg/m. No exam data present  Depression screen [facility] 2/9 10/28/2020  Decreased Interest 0  Down, Depressed, Hopeless 0  PHQ - 2 Score 0  Altered sleeping 0  Tired, decreased energy 0  Change in appetite 0  Feeling bad or failure about yourself  0  Trouble concentrating 0  Moving slowly or fidgety/restless 0  Suicidal thoughts 0  PHQ-9 Score 0     General: well developed, well nourished,  pleasant middle-age Caucasian female, seated, in no evident distress Head: head normocephalic and atraumatic.   Neck: supple with no carotid or supraclavicular bruits Cardiovascular: regular rate and rhythm, no murmurs Musculoskeletal: no deformity Skin:  no rash/petichiae Vascular:  Normal pulses all extremities   Neurologic Exam Mental Status: Awake and fully alert.   Fluent speech and language.  Oriented to place and time. Recent and remote memory intact. Attention span, concentration and  fund of knowledge appropriate. Mood and affect appropriate.  Cranial Nerves: Fundoscopic exam reveals sharp disc margins. Pupils equal, briskly reactive to light. Extraocular movements full without nystagmus. Visual fields full to confrontation. Hearing intact. Facial sensation intact. Face, tongue, palate moves normally and symmetrically.  Motor: Normal bulk and tone. Normal strength in all tested extremity muscles. Sensory.: intact to touch , pinprick , position and vibratory sensation.  Coordination: Rapid alternating movements normal in all extremities. Finger-to-nose and heel-to-shin performed accurately bilaterally. Gait and Station: Arises from chair without difficulty. Stance is normal. Gait demonstrates normal stride length and balance Reflexes: 1+ and symmetric. Toes downgoing.     NIHSS  0 Modified Rankin  0     ASSESSMENT: Alexis Frye is a 69 y.o. year old female presented with sudden onset left face and hand numbness and LLE weakness lasting a few hours then resolved on 08/15/2020 likely in setting of right brain TIA secondary to small vessel disease. Vascular risk factors include HTN, HLD and TIA hx.      PLAN:  1. TIA :  a. No residual deficit.   b. Per patient request, increase aspirin dosage to aspirin 325 mg daily and discontinue Plavix as concern of continuing on Plavix long-term due to family history with intolerance.  She is aware of recommendation of Plavix due to recent TIA on aspirin therapy and verbalized understanding of risk.  Continue atorvastatin for secondary stroke prevention. c. Discussed secondary stroke prevention measures and importance of close PCP follow up for aggressive stroke risk factor management  2. HTN: BP goal <130/90.  Stable on amlodipine per PCP 3. HLD: LDL goal <70. Recent LDL 79.  On atorvastatin 40 mg daily.  She plans on following up with PCP for repeat lipid panel over the next month.  Unable to obtain today as she  is currently not  fasting (and recently ate cheese and meat)   Follow up in 6 months or call earlier if needed   CC:  [facility] provider: Dr. Carron Brazen, Darlen Round, MD    I spent 45 minutes of face-to-face and non-face-to-face time with patient.  This included previsit chart review including recent hospitalization pertinent progress notes, lab work and imaging, lab review, study review, order entry, electronic health record documentation, patient education regarding recent stroke, importance of managing stroke risk factors and answered all questions to patient satisfaction    Ihor Austin, [facility]  [facility] [address] [place], [place] [address]  Phone [phone] Fax [phone] Note: This document was prepared with digital dictation and possible smart phrase technology. Any transcriptional errors that result from this process are unintentional.

## 2020-10-28 NOTE — Addendum Note (Signed)
Addended by: Raliegh Ip on: 10/28/2020 03:43 PM   Modules accepted: Orders

## 2020-10-28 NOTE — Patient Instructions (Signed)
Increase aspirin dosage to aspirin 325 mg daily and discontinue Plavix Continue atorvastatin 40 mg daily -please follow-up with PCP for repeat lipid panel over the next month to ensure satisfactory management  Continue to follow up with PCP regarding cholesterol and blood pressure management  Maintain strict control of hypertension with blood pressure goal below 130/90 and cholesterol with LDL cholesterol (bad cholesterol) goal below 70 mg/dL.       Followup in the future with me in 6 months or call earlier if needed       Thank you for coming to see [place] at [facility]. I hope we have been able to provide you high quality care today.  You may receive a patient satisfaction survey over the next few weeks. We would appreciate your feedback and comments so that we may continue to improve ourselves and the health of our patients.

## 2020-11-04 NOTE — Progress Notes (Signed)
I agree with the above plan 

## 2020-11-18 ENCOUNTER — Ambulatory Visit
Admission: EM | Admit: 2020-11-18 | Discharge: 2020-11-18 | Disposition: A | Discharge status: Home / Self Care | Payer: Medicare Other

## 2020-11-18 NOTE — ED Notes (Signed)
Patient needed COVID test results by tomorrow. Updated by patient access that we do not have same day turn around.

## 2020-11-23 ENCOUNTER — Encounter: Payer: Self-pay | Admitting: Emergency Medicine

## 2020-11-23 ENCOUNTER — Other Ambulatory Visit: Payer: Self-pay

## 2020-11-23 ENCOUNTER — Ambulatory Visit
Admission: EM | Admit: 2020-11-23 | Discharge: 2020-11-23 | Disposition: A | Discharge status: Home / Self Care | Payer: Medicare Other | Attending: Emergency Medicine | Admitting: Emergency Medicine

## 2020-11-23 DIAGNOSIS — R1030 Lower abdominal pain, unspecified: Secondary | ICD-10-CM | POA: Diagnosis not present

## 2020-11-23 LAB — POCT URINALYSIS DIP (MANUAL ENTRY)
Bilirubin, UA: NEGATIVE
Blood, UA: NEGATIVE
Glucose, UA: NEGATIVE mg/dL
Ketones, POC UA: NEGATIVE mg/dL
Leukocytes, UA: NEGATIVE
Nitrite, UA: NEGATIVE
Protein Ur, POC: NEGATIVE mg/dL
Spec Grav, UA: 1.015 (ref 1.010–1.025)
Urobilinogen, UA: 0.2 E.U./dL
pH, UA: 6 (ref 5.0–8.0)

## 2020-11-23 MED ORDER — DICYCLOMINE HCL 20 MG PO TABS: 20.0000 mg | ORAL_TABLET | Freq: Three times a day (TID) | ORAL | 0 refills | Status: DC

## 2020-11-23 NOTE — ED Provider Notes (Signed)
[facility]    CSN: [id number] Arrival date & time: 11/23/20  4742      History   Chief Complaint Chief Complaint  Patient presents with  . Abdominal Pain  . Diarrhea    HPI Alexis Frye is a 69 y.o. female history of GERD, hyperlipidemia, presenting today for evaluation of abdominal pain. Reports 5 days developed looser stools, slightly improved but worsened again over past few days. Developed severe cramping pains that wake her up. Pain intermittent. Denies nausea or vomiting. Ibuprofen and tylenol. Reports history of IBS but never experienced pain this intense associated with it. Pain stable from yesterday. Denies fevers.  Prior cholecystectomy, C-sections and hysterectomy.  Reports prior EGD/colonoscopies and normal.  Denies blood in stool.  Does feel pain with bowel movements, but also random.  Denies urinary symptoms.  HPI  Past Medical History:  Diagnosis Date  . Arm pain left  . Arthritis   . GERD (gastroesophageal reflux disease)   . Headache   . Hyperlipemia   . PONV (postoperative nausea and vomiting)   . Seasonal allergies   . TIA (transient ischemic attack) 2012   no residual  . Wears glasses     Patient Active Problem List   Diagnosis Date Noted  . Transient ischemic attack (TIA) 08/15/2020  . Cervicogenic headache 04/14/2017  . Sleep apnea 10/15/2016  . 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 Essential hypertension   . Pain in the chest   . Chest pain 03/31/2016  . White coat hypertension 03/31/2016  . HLD (hyperlipidemia) 03/31/2016  . Musculoskeletal pain 03/31/2016  . GERD (gastroesophageal reflux disease) 03/31/2016  . Occipital headache 10/29/2015    Past Surgical History:  Procedure Laterality Date  . ABDOMINAL HYSTERECTOMY    . CARDIAC CATHETERIZATION N/A 04/01/2016   Procedure: Left Heart Cath and Coronary Angiography;  Surgeon: Peter M Swaziland, MD;  Location: [facility] INVASIVE CV LAB;  Service: Cardiovascular;  Laterality: N/A;  .  CESAREAN SECTION     x2  . CHOLECYSTECTOMY    . COLONOSCOPY    . DIAGNOSTIC LAPAROSCOPY    . DILATION AND CURETTAGE OF UTERUS    . LARYNGOSCOPY     vocal cord polyps  . SHOULDER ARTHROSCOPY WITH ROTATOR CUFF REPAIR Left 11/26/2014   Procedure: LEFT SHOULDER ARTHROSCOPY W/ SAD/DCR (no cuff repair);  Surgeon: Nestor Lewandowsky, MD;  Location: [facility];  Service: Orthopedics;  Laterality: Left;  . TONSILLECTOMY      OB History   No obstetric history on file.      Home Medications    Prior to Admission medications   Medication Sig Start Date End Date Taking? Authorizing Provider  amLODipine (NORVASC) 5 MG tablet Take 5 mg by mouth daily.  01/07/17   [provider]  aspirin EC 325 MG tablet Take 1 tablet (325 mg total) by mouth daily. 10/28/20   Ihor Austin, NP  atorvastatin (LIPITOR) 40 MG tablet Take 1 tablet (40 mg total) by mouth daily. 08/17/20 09/16/20  Narda Bonds, MD  calcium gluconate 500 MG tablet Take 1 tablet by mouth daily. 600 mg    [provider]  cholecalciferol (VITAMIN D) 1000 UNITS tablet Take 1,000 Units by mouth daily.    [provider]  dicyclomine (BENTYL) 20 MG tablet Take 1 tablet (20 mg total) by mouth 4 (four) times daily -  before meals and at bedtime. 11/23/20   Sujey Gundry C, PA-C  diphenhydramine-acetaminophen (TYLENOL PM) 25-500 MG TABS tablet  Take 1 tablet by mouth at bedtime as needed (sleep).    [provider]  gabapentin (NEURONTIN) 300 MG capsule Take 300 mg by mouth 3 (three) times daily.    [provider]  loratadine (CLARITIN) 10 MG tablet Take 10 mg by mouth daily.    [provider]  omeprazole (PRILOSEC) 20 MG capsule Take 20 mg by mouth daily.    [provider]  tiZANidine (ZANAFLEX) 2 MG tablet Take 1 tablet (2 mg total) by mouth at bedtime. 04/14/17   Nilda Riggs, NP    Family History Family History  Problem Relation Age of Onset  . Emphysema  Mother   . Heart disease Mother   . Osteoporosis Mother   . Alcoholism Father   . Heart attack Sister 81       smoker  . Heart attack Sister 17       smoker    Social History Social History   Tobacco Use  . Smoking status: Never Smoker  . Smokeless tobacco: Never Used  Substance Use Topics  . Alcohol use: Yes    Alcohol/week: 0.0 standard drinks    Comment: Occasional wine  . Drug use: No     Allergies   Codeine   Review of Systems Review of Systems  Constitutional: Negative for activity change, appetite change, chills, fatigue and fever.  HENT: Negative for congestion, ear pain, rhinorrhea, sinus pressure, sore throat and trouble swallowing.   Eyes: Negative for discharge and redness.  Respiratory: Negative for cough, chest tightness and shortness of breath.   Cardiovascular: Negative for chest pain.  Gastrointestinal: Positive for abdominal pain and diarrhea. Negative for nausea and vomiting.  Musculoskeletal: Negative for myalgias.  Skin: Negative for rash.  Neurological: Negative for dizziness, light-headedness and headaches.     Physical Exam Triage Vital Signs ED Triage Vitals  Enc Vitals Group     BP      Pulse      Resp      Temp      Temp src      SpO2      Weight      Height      Head Circumference      Peak Flow      Pain Score      Pain Loc      Pain Edu?      Excl. in GC?    No data found.  Updated Vital Signs BP (!) 144/78 (BP Location: Left Arm)   Pulse 70   Temp 98.2 F (36.8 C) (Oral)   Resp 18   SpO2 98%   Visual Acuity Right Eye Distance:   Left Eye Distance:   Bilateral Distance:    Right Eye Near:   Left Eye Near:    Bilateral Near:     Physical Exam Vitals and nursing note reviewed.  Constitutional:      Appearance: She is well-developed and well-nourished.     Comments: No acute distress  HENT:     Head: Normocephalic and atraumatic.     Nose: Nose normal.     Mouth/Throat:     Comments: Oral mucosa pink  and moist, no tonsillar enlargement or exudate. Posterior pharynx patent and nonerythematous, no uvula deviation or swelling. Normal phonation. Eyes:     Conjunctiva/sclera: Conjunctivae normal.  Cardiovascular:     Rate and Rhythm: Normal rate and regular rhythm.  Pulmonary:     Effort: Pulmonary effort is normal. No respiratory  distress.     Comments: Breathing comfortably at rest, CTABL, no wheezing, rales or other adventitious sounds auscultated Abdominal:     General: There is no distension.     Comments: Soft, nondistended, nontender to palpation to bilateral upper quadrant and epigastrium, mild tenderness to palpation to bilateral lower quadrants, negative rebound, negative Rovsing's, negative McBurney's  Musculoskeletal:        General: Normal range of motion.     Cervical back: Neck supple.  Skin:    General: Skin is warm and dry.  Neurological:     Mental Status: She is alert and oriented to person, place, and time.  Psychiatric:        Mood and Affect: Mood and affect normal.      UC Treatments / Results  Labs (all labs ordered are listed, but only abnormal results are displayed) Labs Reviewed  POCT URINALYSIS DIP (MANUAL ENTRY)    EKG   Radiology No results found.  Procedures Procedures (including critical care time)  Medications Ordered in UC Medications - No data to display  Initial Impression / Assessment and Plan / UC Course  I have reviewed the triage vital signs and the nursing notes.  Pertinent labs & imaging results that were available during my care of the patient were reviewed by me and considered in my medical decision making (see chart for details).     UA unremarkable.  At time of exam negative peritoneal signs, at discharge patient did have an episode where pain intensified causing her to sit down.  Will do trial of Bentyl to help with any underlying cramping/spasming related to IBS.  Push fluids.  If pain not improving discussed recommended  further imaging with CT scan.  Discussed strict return precautions. Patient verbalized understanding and is agreeable with plan.  Final Clinical Impressions(s) / UC Diagnoses   Final diagnoses:  Lower abdominal pain     Discharge Instructions     Please begin Bentyl before meals and bedtime or use as needed for abdominal pain/cramping Drink plenty of fluids If pain not improving or worsening despite use of the above please go to the emergency room for further imaging of abdomen    ED Prescriptions    Medication Sig Dispense Auth. Provider   dicyclomine (BENTYL) 20 MG tablet Take 1 tablet (20 mg total) by mouth 4 (four) times daily -  before meals and at bedtime. 20 tablet Madeeha Costantino, [place] C, PA-C     PDMP not reviewed this encounter.   Lew Dawes, [place] 11/23/20 [phone]

## 2020-11-23 NOTE — Discharge Instructions (Addendum)
Please begin Bentyl before meals and bedtime or use as needed for abdominal pain/cramping Drink plenty of fluids If pain not improving or worsening despite use of the above please go to the emergency room for further imaging of abdomen

## 2020-11-23 NOTE — ED Triage Notes (Signed)
Pt here with lower abd pain and diarrhea x 5 days; pt sts thinks could be from IBS but unsure

## 2021-04-09 ENCOUNTER — Encounter: Payer: Self-pay | Admitting: Plastic Surgery

## 2021-04-09 ENCOUNTER — Ambulatory Visit: Payer: Medicare Other | Admitting: Plastic Surgery

## 2021-04-09 ENCOUNTER — Other Ambulatory Visit: Payer: Self-pay

## 2021-04-09 VITALS — BP 135/81 | HR 62 | Ht 64.0 in | Wt 185.0 lb

## 2021-04-09 DIAGNOSIS — M793 Panniculitis, unspecified: Secondary | ICD-10-CM | POA: Diagnosis not present

## 2021-04-09 NOTE — Progress Notes (Signed)
Referring Provider Daisy Floro, MD [address] [place],  [place] [address]   CC:  Chief Complaint  Patient presents with  . consult      Alexis Frye is an 70 y.o. female.  HPI: Patient presents to discuss symptoms regarding her abdominal pannus.  She has near constant irritation in the fold beneath her abdominal pannus.  Abdominal operations include 2 C-sections and laparoscopic gallbladder and hysterectomy.  She does not smoke and she is not diabetic.  For the rashes she currently uses over-the-counter powders and antifungal creams with little sustained relief.  Her weight has fluctuated over the past couple years but overall has been stable for the past 3 months.  She is interested in surgical treatment of her abdomen.  Allergies  Allergen Reactions  . Codeine Other (See Comments)    unk reaction.     Outpatient Encounter Medications as of 04/09/2021  Medication Sig  . amLODipine (NORVASC) 5 MG tablet Take 5 mg by mouth daily.   Marland Kitchen aspirin EC 325 MG tablet Take 1 tablet (325 mg total) by mouth daily.  . calcium gluconate 500 MG tablet Take 1 tablet by mouth daily. 600 mg  . cholecalciferol (VITAMIN D) 1000 UNITS tablet Take 1,000 Units by mouth daily.  Marland Kitchen dicyclomine (BENTYL) 20 MG tablet Take 1 tablet (20 mg total) by mouth 4 (four) times daily -  before meals and at bedtime.  . diphenhydramine-acetaminophen (TYLENOL PM) 25-500 MG TABS tablet Take 1 tablet by mouth at bedtime as needed (sleep).  . gabapentin (NEURONTIN) 300 MG capsule Take 300 mg by mouth 3 (three) times daily.  Marland Kitchen loratadine (CLARITIN) 10 MG tablet Take 10 mg by mouth daily.  Marland Kitchen omeprazole (PRILOSEC) 20 MG capsule Take 20 mg by mouth daily.  Marland Kitchen tiZANidine (ZANAFLEX) 2 MG tablet Take 1 tablet (2 mg total) by mouth at bedtime.  Marland Kitchen atorvastatin (LIPITOR) 40 MG tablet Take 1 tablet (40 mg total) by mouth daily.   No facility-administered encounter medications on file as of 04/09/2021.     Past Medical  History:  Diagnosis Date  . Arm pain left  . Arthritis   . GERD (gastroesophageal reflux disease)   . Headache   . Hyperlipemia   . PONV (postoperative nausea and vomiting)   . Seasonal allergies   . TIA (transient ischemic attack) 2012   no residual  . Wears glasses     Past Surgical History:  Procedure Laterality Date  . ABDOMINAL HYSTERECTOMY    . CARDIAC CATHETERIZATION N/A 04/01/2016   Procedure: Left Heart Cath and Coronary Angiography;  Surgeon: Peter M Swaziland, MD;  Location: [facility] INVASIVE CV LAB;  Service: Cardiovascular;  Laterality: N/A;  . CESAREAN SECTION     x2  . CHOLECYSTECTOMY    . COLONOSCOPY    . DIAGNOSTIC LAPAROSCOPY    . DILATION AND CURETTAGE OF UTERUS    . LARYNGOSCOPY     vocal cord polyps  . SHOULDER ARTHROSCOPY WITH ROTATOR CUFF REPAIR Left 11/26/2014   Procedure: LEFT SHOULDER ARTHROSCOPY W/ SAD/DCR (no cuff repair);  Surgeon: Nestor Lewandowsky, MD;  Location: [facility];  Service: Orthopedics;  Laterality: Left;  . TONSILLECTOMY      Family History  Problem Relation Age of Onset  . Emphysema Mother   . Heart disease Mother   . Osteoporosis Mother   . Alcoholism Father   . Heart attack Sister 58       smoker  . Heart  attack Sister 69       smoker    Social History   Social History Narrative   Lives at home with her husband and mother-in-law.   Right-handed.   2 cups caffeine per day.     Review of Systems General: Denies fevers, chills, weight loss CV: Denies chest pain, shortness of breath, palpitations  Physical Exam Vitals with BMI 04/09/2021 11/23/2020 10/28/2020  Height 5\' 4"  - 5\' 3"   Weight 185 lbs - 181 lbs  BMI 31.74 - 32.07  Systolic 135 144  Diastolic 81 78 76  Pulse 62 70 68    General:  No acute distress,  Alert and oriented, Non-Toxic, Normal speech and affect Abdomen: Abdomen is soft nontender.  She has vertical and horizontal scars in the infraumbilical area.  There is a few laparoscopic port scars.   She has moderate excess adipose tissue in the skin in the supra and infraumbilical areas.  I do not appreciate any hernias.  She does have clear evidence of skin erythema and irritation in the infra abdominal crease today.  Assessment/Plan Patient presents with symptomatic panniculitis.  She has persistent rashes that are uncontrolled with topical treatments.  She is a good candidate for infraumbilical panniculectomy.  We discussed the risk of the procedure that include bleeding, infection, damage to surrounding structures and need for additional procedures.  We discussed that this would not address the supraumbilical area which suits her just fine.  We discussed the location and orientation of the scar and the general postoperative expectations.  I explained the need for drains.  All her questions were answered and we will plan to move forward.  04/09/2021, 12:02 PM

## 2021-04-28 ENCOUNTER — Encounter: Payer: Self-pay | Admitting: Adult Health

## 2021-04-28 ENCOUNTER — Ambulatory Visit: Payer: Medicare Other | Admitting: Adult Health

## 2021-04-28 VITALS — BP 134/83 | HR 76 | Ht 63.0 in | Wt 187.0 lb

## 2021-04-28 DIAGNOSIS — I10 Essential (primary) hypertension: Secondary | ICD-10-CM

## 2021-04-28 DIAGNOSIS — E785 Hyperlipidemia, unspecified: Secondary | ICD-10-CM

## 2021-04-28 DIAGNOSIS — G459 Transient cerebral ischemic attack, unspecified: Secondary | ICD-10-CM | POA: Diagnosis not present

## 2021-04-28 NOTE — Progress Notes (Signed)
[facility] [address] [place]. [place] [address] [phone]       STROKE FOLLOW UP NOTE  Ms. Alexis Frye Date of Birth:  10-31-1951 Medical Record Number:  [id number]   Reason for Referral: TIA follow up    SUBJECTIVE:   CHIEF COMPLAINT:  Chief Complaint  Patient presents with  . Follow-up    RM 14 alone  Pt is well and stable     HPI:   Today, 04/28/2021, Alexis Frye returns for TIA follow-up after prior visit 6 months ago.  Doing well from stroke standpoint without new or reoccurring stroke/TIA symptoms.  She has remained on aspirin (taking 81mg  BID d/t stomach concerns on 325mg  dosing) and atorvastatin 40 mg daily without associated side effects. Blood pressure today 134/83 - monitors at home and has been stable.  Reports recent follow-up with PCP Dr. who obtained lab work which was satisfactory (unable to personally view via epic).  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  No further concerns at this time.    History provided for reference purposes only Initial visit 10/28/2020 JM: Alexis Frye is being seen for hospital follow-up.  She has been doing well since discharge without new or reoccurring stroke/TIA symptoms.  She has remained on aspirin and Plavix despite 3-week recommendation with mild bruising but no bleeding.  She questions ongoing need of Plavix due to family history previously on Plavix with excessive bruising and bleeding.  Remains on atorvastatin 40 mg daily without myalgias.  Blood pressure today 131/76.  Routinely monitors at home which has been stable.  No concerns at this time.  Stroke admission 08/15/2020 Alexis Frye is a 70 y.o. female with a history of previous TIA who presented on 08/15/2020 with sudden onset left facial and hand paresthesias and left leg weakness lasting a few hours and then resolved. Personally reviewed hospitalization pertinent progress notes, lab work and imaging  with summary provided.  Evaluated by Dr. 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  Previously on aspirin and recommended DAPT for 3 weeks then Plavix alone. Hx of HTN on amlodipine which was resumed at discharge. Hx of HLD on atorvastatin with LDL 79 and recommended increasing dosage to 40 mg daily.  Controlled DM with A1c 5.4.  Other stroke risk factors include obesity and history of TIA.  Evaluated by therapies with resolution of deficits and discharged home without therapy needs.  Right brain TIA: Likely small vessel disease  Code Stroke CT head No acute abnormality. ASPECTS 10.     CTA head & neck no LVO  MRI   negative for stroke,  MRA  No LVO ,severe left A2 stenosis  Carotid Doppler  b/l ICA 1 to 39% stenosis  2D Echo  EF 60 to 65%  LDL 79  HgbA1c 5.4  aspirin 81 mg daily prior to admission, now on aspirin 81 mg daily, clopidogrel 75 mg daily for 3 weeks and then clopidogrel alone   therapy recommendations:  none  Disposition:  home      ROS:   14 system review of systems performed and negative with exception of no complaints  PMH:  Past Medical History:  Diagnosis Date  . Arm pain left  . Arthritis   . GERD (gastroesophageal reflux disease)   . Headache   . Hyperlipemia   . PONV (postoperative nausea and vomiting)   .  Seasonal allergies   . TIA (transient ischemic attack) 2012   no residual  . Wears glasses     PSH:  Past Surgical History:  Procedure Laterality Date  . ABDOMINAL HYSTERECTOMY    . CARDIAC CATHETERIZATION N/A 04/01/2016   Procedure: Left Heart Cath and Coronary Angiography;  Surgeon: Peter M Swaziland, MD;  Location: [facility] INVASIVE CV LAB;  Service: Cardiovascular;  Laterality: N/A;  . CESAREAN SECTION     x2  . CHOLECYSTECTOMY    . COLONOSCOPY    . DIAGNOSTIC LAPAROSCOPY    . DILATION AND CURETTAGE OF UTERUS    . LARYNGOSCOPY     vocal cord polyps   . SHOULDER ARTHROSCOPY WITH ROTATOR CUFF REPAIR Left 11/26/2014   Procedure: LEFT SHOULDER ARTHROSCOPY W/ SAD/DCR (no cuff repair);  Surgeon: Nestor Lewandowsky, MD;  Location: [facility];  Service: Orthopedics;  Laterality: Left;  . TONSILLECTOMY      Social History:  Social History   Socioeconomic History  . Marital status: Married    Spouse name: Not on file  . Number of children: 2  . Years of education: 37  . Highest education level: Not on file  Occupational History  . Occupation: Education administrator Aid    Comment: [employer]  Tobacco Use  . Smoking status: Never Smoker  . Smokeless tobacco: Never Used  Substance and Sexual Activity  . Alcohol use: Yes    Alcohol/week: 0.0 standard drinks    Comment: Occasional wine  . Drug use: No  . Sexual activity: Not on file  Other Topics Concern  . Not on file  Social History Narrative   Lives at home with her husband and mother-in-law.   Right-handed.   2 cups caffeine per day.   Social Determinants of Health   Financial Resource Strain: Not on file  Food Insecurity: Not on file  Transportation Needs: Not on file  Physical Activity: Not on file  Stress: Not on file  Social Connections: Not on file  Intimate Partner Violence: Not on file    Family History:  Family History  Problem Relation Age of Onset  . Emphysema Mother   . Heart disease Mother   . Osteoporosis Mother   . Alcoholism Father   . Heart attack Sister 63       smoker  . Heart attack Sister 63       smoker    Medications:   Current Outpatient Medications on File Prior to Visit  Medication Sig Dispense Refill  . amLODipine (NORVASC) 5 MG tablet Take 5 mg by mouth daily.   1  . aspirin EC 81 MG tablet 3 tablets    . atorvastatin (LIPITOR) 40 MG tablet Take 1 tablet (40 mg total) by mouth daily. 30 tablet 0  . calcium gluconate 500 MG tablet Take 1 tablet by mouth daily. 600 mg    . cholecalciferol (VITAMIN D) 1000  UNITS tablet Take 1,000 Units by mouth daily.    Marland Kitchen gabapentin (NEURONTIN) 300 MG capsule Take 300 mg by mouth 3 (three) times daily.    Marland Kitchen loratadine (CLARITIN) 10 MG tablet Take 10 mg by mouth daily.    Marland Kitchen omeprazole (PRILOSEC) 20 MG capsule Take 20 mg by mouth daily.     No current facility-administered medications on file prior to visit.    Allergies:   Allergies  Allergen Reactions  . Codeine Other (See Comments)    unk reaction.  OBJECTIVE:  Physical Exam  Vitals:   04/28/21 0904  BP: 134/83  Pulse: 76  Weight: 187 lb (84.8 kg)  Height: 5\' 3"  (1.6 m)   Body mass index is 33.13 kg/m. No exam data present  General: well developed, well nourished,  pleasant middle-age Caucasian female, seated, in no evident distress Head: head normocephalic and atraumatic.   Neck: supple with no carotid or supraclavicular bruits Cardiovascular: regular rate and rhythm, no murmurs Musculoskeletal: no deformity Skin:  no rash/petichiae Vascular:  Normal pulses all extremities   Neurologic Exam Mental Status: Awake and fully alert.   Fluent speech and language.  Oriented to place and time. Recent and remote memory intact. Attention span, concentration and fund of knowledge appropriate. Mood and affect appropriate.  Cranial Nerves: Pupils equal, briskly reactive to light. Extraocular movements full without nystagmus. Visual fields full to confrontation. Hearing intact. Facial sensation intact. Face, tongue, palate moves normally and symmetrically.  Motor: Normal bulk and tone. Normal strength in all tested extremity muscles. Sensory.: intact to touch , pinprick , position and vibratory sensation.  Coordination: Rapid alternating movements normal in all extremities. Finger-to-nose and heel-to-shin performed accurately bilaterally. Gait and Station: Arises from chair without difficulty. Stance is normal. Gait demonstrates normal stride length and balance without use of assistive  device Reflexes: 1+ and symmetric. Toes downgoing.        ASSESSMENT: Alexis Frye is a 70 y.o. year old female presented with sudden onset left face and hand numbness and LLE weakness lasting a few hours then resolved on 08/15/2020 likely in setting of right brain TIA secondary to small vessel disease. Vascular risk factors include HTN, HLD and TIA hx.      PLAN:  1. TIA :  a. Continue aspirin 81 mg twice daily and atorvastatin for secondary stroke prevention. Intolerance to plavix and high dose aspirin b. Discussed secondary stroke prevention measures and importance of close PCP follow up for aggressive stroke risk factor management  2. HTN: BP goal <130/90.  Stable on amlodipine per PCP 3. HLD: LDL goal <70.  On atorvastatin 40 mg daily - reports recent lab work obtained by PCP which was satisfactory (unable to view via epic) -advised continued follow-up with PCP for monitoring and prescribing   Overall stable from stroke standpoint and recommend follow-up on an as-needed basis   CC:  [facility] provider: Dr. 10/15/2020, Carron Brazen, MD     Darlen Round, [facility]  [facility] [address] [place], [place]  Phone [phone] Fax [phone] Note: This document was prepared with digital dictation and possible smart phrase technology. Any transcriptional errors that result from this process are unintentional.

## 2021-04-28 NOTE — Progress Notes (Signed)
I agree with the above plan 

## 2021-04-28 NOTE — Patient Instructions (Signed)
Continue aspirin 81 twice daily  and atorvastatin  for secondary stroke prevention  Continue to follow up with PCP regarding cholestesrol and blood pressure management  Maintain strict control of hypertension with blood pressure goal below 130/90 and cholesterol with LDL cholesterol (bad cholesterol) goal below 70 mg/dL.       Thank you for coming to see [place] at [facility]. I hope we have been able to provide you high quality care today.  You may receive a patient satisfaction survey over the next few weeks. We would appreciate your feedback and comments so that we may continue to improve ourselves and the health of our patients.

## 2021-05-19 ENCOUNTER — Telehealth: Payer: Self-pay | Admitting: Plastic Surgery

## 2021-05-19 NOTE — Telephone Encounter (Signed)
Patient called in to report positive Covid test (home test per Dr. Tenny Craw) and was prescribed Paxlovid 300 mg. Dr. Arita Miss ok'd virtual pre-op for 6/8. However, if symptoms persist, patient has been advised to call me to discuss postponing the surgery until resolved. Patient voiced understand and agreement.

## 2021-05-21 ENCOUNTER — Encounter: Payer: Self-pay | Admitting: Surgical

## 2021-05-21 ENCOUNTER — Ambulatory Visit (INDEPENDENT_AMBULATORY_CARE_PROVIDER_SITE_OTHER): Payer: Medicare Other | Admitting: Surgical

## 2021-05-21 ENCOUNTER — Other Ambulatory Visit: Payer: Self-pay

## 2021-05-21 DIAGNOSIS — M793 Panniculitis, unspecified: Secondary | ICD-10-CM

## 2021-05-21 MED ORDER — ONDANSETRON HCL 4 MG PO TABS: 4.0000 mg | ORAL_TABLET | Freq: Three times a day (TID) | ORAL | 0 refills | Status: DC | PRN

## 2021-05-21 MED ORDER — HYDROCODONE-ACETAMINOPHEN 5-325 MG PO TABS: 1.0000 | ORAL_TABLET | Freq: Four times a day (QID) | ORAL | 0 refills | Status: AC | PRN

## 2021-05-21 NOTE — Progress Notes (Signed)
Patient ID: Alexis Frye, female    DOB: 12/04/1951, 70 y.o.   MRN: [id number]  Chief Complaint  Patient presents with  . Pre-op Exam        ICD-10-CM   1. Panniculitis  M79.3      History of Present Illness: Alexis Frye is a 70 y.o.  female  with a history of panniculitis.  She presents for preoperative evaluation for upcoming procedure, infraumbilical panniculectomy, scheduled for 06/09/2021 with Dr. Arita Miss.  The patient has not had problems with anesthesia. No history of DVT/PE.  No family history of DVT/PE.  No family or personal history of bleeding or clotting disorders.  Patient is not currently taking any blood thinners.  No history of MI.   Summary of Previous Visit: She does not smoke and is not a diabetic.  PMH Significant for: TIA - currently on aspirin 81mg  BID.  Hypertension. She reports that she is doing well overall after her COVID diagnosis.  She reports that most of her symptoms have resolved she just mostly has some muscle aches at this time.  She is not having any shortness of breath, chest pain, nausea, vomiting, dizziness or fevers or chills.  The patient gave consent to have this visit done by telemedicine / virtual visit, two identifiers were used to identify patient. This is also consent for access the chart and treat the patient via this visit. The patient is located at home.  I, the provider, am at the office.  We spent 10 minutes together for the visit.  Joined by telephone.   Past Medical History: Allergies: Allergies  Allergen Reactions  . Codeine Other (See Comments)    unk reaction.     Current Medications:  Current Outpatient Medications:  .  amLODipine (NORVASC) 5 MG tablet, Take 5 mg by mouth daily. , Disp: , Rfl: 1 .  aspirin EC 81 MG tablet, 3 tablets, Disp: , Rfl:  .  atorvastatin (LIPITOR) 40 MG tablet, Take 1 tablet (40 mg total) by mouth daily., Disp: 30 tablet, Rfl: 0 .  calcium gluconate 500 MG tablet, Take 1 tablet by mouth  daily. 600 mg, Disp: , Rfl:  .  cholecalciferol (VITAMIN D) 1000 UNITS tablet, Take 1,000 Units by mouth daily., Disp: , Rfl:  .  gabapentin (NEURONTIN) 300 MG capsule, Take 300 mg by mouth 3 (three) times daily., Disp: , Rfl:  .  loratadine (CLARITIN) 10 MG tablet, Take 10 mg by mouth daily., Disp: , Rfl:  .  omeprazole (PRILOSEC) 20 MG capsule, Take 20 mg by mouth daily., Disp: , Rfl:   Past Medical Problems: Past Medical History:  Diagnosis Date  . Arm pain left  . Arthritis   . GERD (gastroesophageal reflux disease)   . Headache   . Hyperlipemia   . PONV (postoperative nausea and vomiting)   . Seasonal allergies   . TIA (transient ischemic attack) 2012   no residual  . Wears glasses     Past Surgical History: Past Surgical History:  Procedure Laterality Date  . ABDOMINAL HYSTERECTOMY    . CARDIAC CATHETERIZATION N/A 04/01/2016   Procedure: Left Heart Cath and Coronary Angiography;  Surgeon: Peter M 04/03/2016, MD;  Location: [facility] INVASIVE CV LAB;  Service: Cardiovascular;  Laterality: N/A;  . CESAREAN SECTION     x2  . CHOLECYSTECTOMY    . COLONOSCOPY    . DIAGNOSTIC LAPAROSCOPY    . DILATION AND CURETTAGE OF UTERUS    . LARYNGOSCOPY  vocal cord polyps  . SHOULDER ARTHROSCOPY WITH ROTATOR CUFF REPAIR Left 11/26/2014   Procedure: LEFT SHOULDER ARTHROSCOPY W/ SAD/DCR (no cuff repair);  Surgeon: Nestor Lewandowsky, MD;  Location: [facility];  Service: Orthopedics;  Laterality: Left;  . TONSILLECTOMY      Social History: Social History   Socioeconomic History  . Marital status: Married    Spouse name: Not on file  . Number of children: 2  . Years of education: 95  . Highest education level: Not on file  Occupational History  . Occupation: Education administrator Aid    Comment: [employer]  Tobacco Use  . Smoking status: Never Smoker  . Smokeless tobacco: Never Used  Substance and Sexual Activity  . Alcohol use: Yes    Alcohol/week: 0.0  standard drinks    Comment: Occasional wine  . Drug use: No  . Sexual activity: Not on file  Other Topics Concern  . Not on file  Social History Narrative   Lives at home with her husband and mother-in-law.   Right-handed.   2 cups caffeine per day.   Social Determinants of Health   Financial Resource Strain: Not on file  Food Insecurity: Not on file  Transportation Needs: Not on file  Physical Activity: Not on file  Stress: Not on file  Social Connections: Not on file  Intimate Partner Violence: Not on file    Family History: Family History  Problem Relation Age of Onset  . Emphysema Mother   . Heart disease Mother   . Osteoporosis Mother   . Alcoholism Father   . Heart attack Sister 15       smoker  . Heart attack Sister 72       smoker    Review of Systems: Review of Systems  Constitutional: Negative for chills and fever.  Respiratory: Positive for cough. Negative for shortness of breath and wheezing.   Cardiovascular: Negative for chest pain.  Gastrointestinal: Negative for nausea and vomiting.  Neurological: Negative for dizziness and headaches.    Physical Exam: Vital Signs There were no vitals taken for this visit.  Physical Exam  Psychiatric:        Mood and Affect: Mood normal.        Behavior: Behavior normal.    Assessment/Plan: The patient is scheduled for infraumbilical panniculectomy with Dr. Arita Miss.  Risks, benefits, and alternatives of procedure discussed, questions answered and consent obtained.    Smoking Status: Non-smoker; Counseling Given?  N/A  Caprini Score: 5, high; Risk Factors include: Age, BMI greater than 25, and length of planned surgery. Recommendation for mechanical and pharmacological prophylaxis. Encourage early ambulation.   Pictures obtained:[email or web address]   Post-op Rx sent to pharmacy: Norco, Zofran  Patient was provided with the General Surgical Risk consent document and Pain Medication agreement via email.  Patient was  unable to be seen in the office due to recent COVID-19 diagnosis. We discussed consent form today during this preop appointment. All of their questions were answered to their satisfaction.  Recommended calling if they have any further questions.  Recommend patient bring the consent form with her on her surgical day and if she has any further questions or concerns she is welcome to call us prior to surgery or ascus on the day of surgery.  Patient is understanding and in agreement with this.   The risk that can be encountered for this procedure were discussed and include the following but not limited to these: asymmetry, damage  to surrounding structures, fluid accumulation, firmness of the tissue, skin loss, decrease or no sensation, fat necrosis, bleeding, infection, healing delay.  Deep vein thrombosis, cardiac and pulmonary complications are risks to any procedure.  There are risks of anesthesia, changes to skin sensation and injury to nerves or blood vessels.  The muscle can be temporarily or permanently injured.  You may have an allergic reaction to tape, suture, glue, blood products which can result in skin discoloration, swelling, pain, skin lesions, poor healing.  Any of these can lead to the need for revisonal surgery or stage procedures.  Weight gain and weigh loss can also effect the long term appearance. The results are not guaranteed to last a lifetime.  Future surgery may be required.    We will send PCP clearance for patient to hold aspirin prior to surgery.  Electronically signed by: Kermit Balo Adrielle Polakowski, PA-C 05/21/2021 8:39 AM

## 2021-05-21 NOTE — H&P (View-Only) (Signed)
Patient ID: Alexis Frye, female    DOB: 12/04/1951, 70 y.o.   MRN: [id number]  Chief Complaint  Patient presents with  . Pre-op Exam        ICD-10-CM   1. Panniculitis  M79.3      History of Present Illness: Alexis Frye is a 70 y.o.  female  with a history of panniculitis.  She presents for preoperative evaluation for upcoming procedure, infraumbilical panniculectomy, scheduled for 06/09/2021 with Dr. Arita Miss.  The patient has not had problems with anesthesia. No history of DVT/PE.  No family history of DVT/PE.  No family or personal history of bleeding or clotting disorders.  Patient is not currently taking any blood thinners.  No history of MI.   Summary of Previous Visit: She does not smoke and is not a diabetic.  PMH Significant for: TIA - currently on aspirin 81mg  BID.  Hypertension. She reports that she is doing well overall after her COVID diagnosis.  She reports that most of her symptoms have resolved she just mostly has some muscle aches at this time.  She is not having any shortness of breath, chest pain, nausea, vomiting, dizziness or fevers or chills.  The patient gave consent to have this visit done by telemedicine / virtual visit, two identifiers were used to identify patient. This is also consent for access the chart and treat the patient via this visit. The patient is located at home.  I, the provider, am at the office.  We spent 10 minutes together for the visit.  Joined by telephone.   Past Medical History: Allergies: Allergies  Allergen Reactions  . Codeine Other (See Comments)    unk reaction.     Current Medications:  Current Outpatient Medications:  .  amLODipine (NORVASC) 5 MG tablet, Take 5 mg by mouth daily. , Disp: , Rfl: 1 .  aspirin EC 81 MG tablet, 3 tablets, Disp: , Rfl:  .  atorvastatin (LIPITOR) 40 MG tablet, Take 1 tablet (40 mg total) by mouth daily., Disp: 30 tablet, Rfl: 0 .  calcium gluconate 500 MG tablet, Take 1 tablet by mouth  daily. 600 mg, Disp: , Rfl:  .  cholecalciferol (VITAMIN D) 1000 UNITS tablet, Take 1,000 Units by mouth daily., Disp: , Rfl:  .  gabapentin (NEURONTIN) 300 MG capsule, Take 300 mg by mouth 3 (three) times daily., Disp: , Rfl:  .  loratadine (CLARITIN) 10 MG tablet, Take 10 mg by mouth daily., Disp: , Rfl:  .  omeprazole (PRILOSEC) 20 MG capsule, Take 20 mg by mouth daily., Disp: , Rfl:   Past Medical Problems: Past Medical History:  Diagnosis Date  . Arm pain left  . Arthritis   . GERD (gastroesophageal reflux disease)   . Headache   . Hyperlipemia   . PONV (postoperative nausea and vomiting)   . Seasonal allergies   . TIA (transient ischemic attack) 2012   no residual  . Wears glasses     Past Surgical History: Past Surgical History:  Procedure Laterality Date  . ABDOMINAL HYSTERECTOMY    . CARDIAC CATHETERIZATION N/A 04/01/2016   Procedure: Left Heart Cath and Coronary Angiography;  Surgeon: Peter M 04/03/2016, MD;  Location: [facility] INVASIVE CV LAB;  Service: Cardiovascular;  Laterality: N/A;  . CESAREAN SECTION     x2  . CHOLECYSTECTOMY    . COLONOSCOPY    . DIAGNOSTIC LAPAROSCOPY    . DILATION AND CURETTAGE OF UTERUS    . LARYNGOSCOPY  vocal cord polyps  . SHOULDER ARTHROSCOPY WITH ROTATOR CUFF REPAIR Left 11/26/2014   Procedure: LEFT SHOULDER ARTHROSCOPY W/ SAD/DCR (no cuff repair);  Surgeon: Nestor Lewandowsky, MD;  Location: [facility];  Service: Orthopedics;  Laterality: Left;  . TONSILLECTOMY      Social History: Social History   Socioeconomic History  . Marital status: Married    Spouse name: Not on file  . Number of children: 2  . Years of education: 95  . Highest education level: Not on file  Occupational History  . Occupation: Education administrator Aid    Comment: [employer]  Tobacco Use  . Smoking status: Never Smoker  . Smokeless tobacco: Never Used  Substance and Sexual Activity  . Alcohol use: Yes    Alcohol/week: 0.0  standard drinks    Comment: Occasional wine  . Drug use: No  . Sexual activity: Not on file  Other Topics Concern  . Not on file  Social History Narrative   Lives at home with her husband and mother-in-law.   Right-handed.   2 cups caffeine per day.   Social Determinants of Health   Financial Resource Strain: Not on file  Food Insecurity: Not on file  Transportation Needs: Not on file  Physical Activity: Not on file  Stress: Not on file  Social Connections: Not on file  Intimate Partner Violence: Not on file    Family History: Family History  Problem Relation Age of Onset  . Emphysema Mother   . Heart disease Mother   . Osteoporosis Mother   . Alcoholism Father   . Heart attack Sister 15       smoker  . Heart attack Sister 72       smoker    Review of Systems: Review of Systems  Constitutional: Negative for chills and fever.  Respiratory: Positive for cough. Negative for shortness of breath and wheezing.   Cardiovascular: Negative for chest pain.  Gastrointestinal: Negative for nausea and vomiting.  Neurological: Negative for dizziness and headaches.    Physical Exam: Vital Signs There were no vitals taken for this visit.  Physical Exam  Psychiatric:        Mood and Affect: Mood normal.        Behavior: Behavior normal.    Assessment/Plan: The patient is scheduled for infraumbilical panniculectomy with Dr. Arita Miss.  Risks, benefits, and alternatives of procedure discussed, questions answered and consent obtained.    Smoking Status: Non-smoker; Counseling Given?  N/A  Caprini Score: 5, high; Risk Factors include: Age, BMI greater than 25, and length of planned surgery. Recommendation for mechanical and pharmacological prophylaxis. Encourage early ambulation.   Pictures obtained:[email or web address]   Post-op Rx sent to pharmacy: Norco, Zofran  Patient was provided with the General Surgical Risk consent document and Pain Medication agreement via email.  Patient was  unable to be seen in the office due to recent COVID-19 diagnosis. We discussed consent form today during this preop appointment. All of their questions were answered to their satisfaction.  Recommended calling if they have any further questions.  Recommend patient bring the consent form with her on her surgical day and if she has any further questions or concerns she is welcome to call us prior to surgery or ascus on the day of surgery.  Patient is understanding and in agreement with this.   The risk that can be encountered for this procedure were discussed and include the following but not limited to these: asymmetry, damage  to surrounding structures, fluid accumulation, firmness of the tissue, skin loss, decrease or no sensation, fat necrosis, bleeding, infection, healing delay.  Deep vein thrombosis, cardiac and pulmonary complications are risks to any procedure.  There are risks of anesthesia, changes to skin sensation and injury to nerves or blood vessels.  The muscle can be temporarily or permanently injured.  You may have an allergic reaction to tape, suture, glue, blood products which can result in skin discoloration, swelling, pain, skin lesions, poor healing.  Any of these can lead to the need for revisonal surgery or stage procedures.  Weight gain and weigh loss can also effect the long term appearance. The results are not guaranteed to last a lifetime.  Future surgery may be required.    We will send PCP clearance for patient to hold aspirin prior to surgery.  Electronically signed by: Kermit Balo Teagyn Fishel, PA-C 05/21/2021 8:39 AM

## 2021-05-29 ENCOUNTER — Encounter: Payer: Self-pay | Admitting: Surgical

## 2021-05-29 NOTE — Progress Notes (Signed)
Surgical Clearance has been received from Dr. Gildardo Cranker for patient's upcoming surgery Panniculectomy with Dr. Arita Miss .  Pt is medically cleared.  Medications to hold prior to surgery  Clopidogrel (Stop taking: 06/04/21 Begin retaking 06/10/21)  ASA 81mg  Stop taking: 06/02/21 and restart 06/10/21  Must take amlodipine that day.  Informed patient.

## 2021-06-03 NOTE — Pre-Procedure Instructions (Signed)
Surgical Instructions    Your procedure is scheduled on Monday, June 27th.  Report to [facility] Main Entrance "A" at 8:45 A.M., then check in with the Admitting office.  Call this number if you have problems the morning of surgery:  [phone]   If you have any questions prior to your surgery date call [phone]: Open Monday-Friday 8am-4pm    Remember:  Do not eat after midnight the night before your surgery  You may drink clear liquids until 7:45 a.m. the morning of your surgery.   Clear liquids allowed are: Water, Non-Citrus Juices (without pulp), Carbonated Beverages, Clear Tea, Black Coffee Only, and Gatorade    Take these medicines the morning of surgery with A SIP OF WATER  amLODipine (NORVASC)  gabapentin (NEURONTIN) loratadine (CLARITIN)  omeprazole (PRILOSEC)    Take these medications as needed: HYDROcodone-acetaminophen (NORCO/VICODIN) ondansetron (ZOFRAN)  As of today, STOP taking any Aspirin (unless otherwise instructed by your surgeon) Aleve, Naproxen, Ibuprofen, Motrin, Advil, Goody's, BC's, all herbal medications, fish oil, and all vitamins.                     Do NOT Smoke (Tobacco/Vaping) or drink Alcohol 24 hours prior to your procedure.  If you use a CPAP at night, you may bring all equipment for your overnight stay.   Contacts, glasses, piercing's, hearing aid's, dentures or partials may not be worn into surgery, please bring cases for these belongings.    For patients admitted to the hospital, discharge time will be determined by your treatment team.   Patients discharged the day of surgery will not be allowed to drive home, and someone needs to stay with them for 24 hours.  ONLY 1 SUPPORT PERSON MAY BE PRESENT WHILE YOU ARE IN SURGERY. IF YOU ARE TO BE ADMITTED ONCE YOU ARE IN YOUR ROOM YOU WILL BE ALLOWED TWO (2) VISITORS.  Minor children may have two parents present. Special consideration for safety and communication needs will be reviewed on a  case by case basis.   Special instructions:   [place]- Preparing For Surgery  Before surgery, you can play an important role. Because skin is not sterile, your skin needs to be as free of germs as possible. You can reduce the number of germs on your skin by washing with CHG (chlorahexidine gluconate) Soap before surgery.  CHG is an antiseptic cleaner which kills germs and bonds with the skin to continue killing germs even after washing.    Oral Hygiene is also important to reduce your risk of infection.  Remember - BRUSH YOUR TEETH THE MORNING OF SURGERY WITH YOUR REGULAR TOOTHPASTE  Please do not use if you have an allergy to CHG or antibacterial soaps. If your skin becomes reddened/irritated stop using the CHG.  Do not shave (including legs and underarms) for at least 48 hours prior to first CHG shower. It is OK to shave your face.  Please follow these instructions carefully.   Shower the NIGHT BEFORE SURGERY and the MORNING OF SURGERY  If you chose to wash your hair, wash your hair first as usual with your normal shampoo.  After you shampoo, rinse your hair and body thoroughly to remove the shampoo.  Use CHG Soap as you would any other liquid soap. You can apply CHG directly to the skin and wash gently with a scrungie or a clean washcloth.   Apply the CHG Soap to your body ONLY FROM THE NECK DOWN.  Do not use  on open wounds or open sores. Avoid contact with your eyes, ears, mouth and genitals (private parts). Wash Face and genitals (private parts)  with your normal soap.   Wash thoroughly, paying special attention to the area where your surgery will be performed.  Thoroughly rinse your body with warm water from the neck down.  DO NOT shower/wash with your normal soap after using and rinsing off the CHG Soap.  Pat yourself dry with a CLEAN TOWEL.  Wear CLEAN PAJAMAS to bed the night before surgery  Place CLEAN SHEETS on your bed the night before your surgery  DO NOT SLEEP  WITH PETS.   Day of Surgery: Shower with CHG soap. Do not wear jewelry, make up, nail polish, gel polish, artificial nails, or any other type of covering on natural nails including finger and toenails. If patients have artificial nails, gel coating, etc. that need to be removed by a nail salon please have this removed prior to surgery. Surgery may need to be canceled/delayed if the surgeon/ anesthesia feels like the patient is unable to be adequately monitored. Do not wear lotions, powders, perfumes, or deodorant. Do not shave 48 hours prior to surgery.   Do not bring valuables to the hospital. [facility] is not responsible for any belongings or valuables. Wear Clean/Comfortable clothing the morning of surgery Remember to brush your teeth WITH YOUR REGULAR TOOTHPASTE.   Please read over the following fact sheets that you were given.

## 2021-06-04 ENCOUNTER — Encounter (HOSPITAL_COMMUNITY): Payer: Self-pay

## 2021-06-04 ENCOUNTER — Encounter (HOSPITAL_COMMUNITY)
Admission: RE | Admit: 2021-06-04 | Discharge: 2021-06-04 | Disposition: A | Discharge status: Home / Self Care | Payer: Medicare Other | Source: Ambulatory Visit | Attending: Plastic Surgery | Admitting: Plastic Surgery

## 2021-06-04 ENCOUNTER — Other Ambulatory Visit: Payer: Self-pay

## 2021-06-04 DIAGNOSIS — Z01812 Encounter for preprocedural laboratory examination: Secondary | ICD-10-CM | POA: Insufficient documentation

## 2021-06-04 HISTORY — DX: Essential (primary) hypertension: I10

## 2021-06-04 HISTORY — DX: Cerebral infarction, unspecified: I63.9

## 2021-06-04 LAB — CBC
HCT: 42.3 % (ref 36.0–46.0)
Hemoglobin: 13.9 g/dL (ref 12.0–15.0)
MCH: 30.2 pg (ref 26.0–34.0)
MCHC: 32.9 g/dL (ref 30.0–36.0)
MCV: 92 fL (ref 80.0–100.0)
Platelets: 211 10*3/uL (ref 150–400)
RBC: 4.6 MIL/uL (ref 3.87–5.11)
RDW: 12.8 % (ref 11.5–15.5)
WBC: 5.9 10*3/uL (ref 4.0–10.5)
nRBC: 0 % (ref 0.0–0.2)

## 2021-06-04 LAB — BASIC METABOLIC PANEL
Anion gap: 11 (ref 5–15)
BUN: 21 mg/dL (ref 8–23)
CO2: 17 mmol/L — ABNORMAL LOW (ref 22–32)
Calcium: 8.8 mg/dL — ABNORMAL LOW (ref 8.9–10.3)
Chloride: 110 mmol/L (ref 98–111)
Creatinine, Ser: 0.72 mg/dL (ref 0.44–1.00)
GFR, Estimated: >60 mL/min (ref >60)
Glucose, Bld: 97 mg/dL (ref 70–99)
Potassium: 4.3 mmol/L (ref 3.5–5.1)
Sodium: 138 mmol/L (ref 135–145)

## 2021-06-04 NOTE — Progress Notes (Signed)
PCP - Daisy Floro Cardiologist - denies Neurologist: Delia Heady  PPM/ICD - denies   Chest x-ray - n/a EKG - 08/16/20 Stress Test - denies ECHO - 08/16/20 Cardiac Cath -08/16/20   Sleep Study - 12/08/16 (normal) cpap not needed   No diabetes  As of today, STOP taking any Aspirin (unless otherwise instructed by your surgeon) Aleve, Naproxen, Ibuprofen, Motrin, Advil, Goody's, BC's, all herbal medications, fish oil, and all vitamins.  ERAS Protcol -yes PRE-SURGERY Ensure or G2- no ensure given  COVID TEST- ambulatory surgery   Anesthesia review: no  Patient denies shortness of breath, fever, cough and chest pain at PAT appointment   All instructions explained to the patient, with a verbal understanding of the material. Patient agrees to go over the instructions while at home for a better understanding. Patient also instructed to self quarantine after being tested for COVID-19. The opportunity to ask questions was provided.

## 2021-06-09 ENCOUNTER — Encounter (HOSPITAL_COMMUNITY)
Admission: RE | Disposition: A | Discharge status: Home / Self Care | Payer: Self-pay | Source: Home / Self Care | Attending: Plastic Surgery

## 2021-06-09 ENCOUNTER — Ambulatory Visit (HOSPITAL_COMMUNITY): Payer: Medicare Other | Admitting: Physician Assistant

## 2021-06-09 ENCOUNTER — Ambulatory Visit (HOSPITAL_COMMUNITY)
Admission: RE | Admit: 2021-06-09 | Discharge: 2021-06-09 | Disposition: A | Discharge status: Home / Self Care | Payer: Medicare Other | Attending: Plastic Surgery | Admitting: Plastic Surgery

## 2021-06-09 ENCOUNTER — Encounter (HOSPITAL_COMMUNITY): Payer: Self-pay | Admitting: Plastic Surgery

## 2021-06-09 ENCOUNTER — Ambulatory Visit (HOSPITAL_COMMUNITY): Payer: Medicare Other | Admitting: Anesthesiology

## 2021-06-09 DIAGNOSIS — Z98891 History of uterine scar from previous surgery: Secondary | ICD-10-CM | POA: Insufficient documentation

## 2021-06-09 DIAGNOSIS — Z8673 Personal history of transient ischemic attack (TIA), and cerebral infarction without residual deficits: Secondary | ICD-10-CM | POA: Insufficient documentation

## 2021-06-09 DIAGNOSIS — Z90710 Acquired absence of both cervix and uterus: Secondary | ICD-10-CM | POA: Diagnosis not present

## 2021-06-09 DIAGNOSIS — Z79899 Other long term (current) drug therapy: Secondary | ICD-10-CM | POA: Insufficient documentation

## 2021-06-09 DIAGNOSIS — Z885 Allergy status to narcotic agent status: Secondary | ICD-10-CM | POA: Insufficient documentation

## 2021-06-09 DIAGNOSIS — Z8616 Personal history of COVID-19: Secondary | ICD-10-CM | POA: Diagnosis not present

## 2021-06-09 DIAGNOSIS — M793 Panniculitis, unspecified: Secondary | ICD-10-CM | POA: Insufficient documentation

## 2021-06-09 DIAGNOSIS — Z9049 Acquired absence of other specified parts of digestive tract: Secondary | ICD-10-CM | POA: Insufficient documentation

## 2021-06-09 HISTORY — PX: PANNICULECTOMY: SHX5360

## 2021-06-09 SURGERY — PANNICULECTOMY
Anesthesia: General | Site: Abdomen

## 2021-06-09 MED ORDER — PHENYLEPHRINE 40 MCG/ML (10ML) SYRINGE FOR IV PUSH (FOR BLOOD PRESSURE SUPPORT)
PREFILLED_SYRINGE | INTRAVENOUS | Status: AC
  Filled 2021-06-09: qty 20

## 2021-06-09 MED ORDER — CHLORHEXIDINE GLUCONATE 0.12 % MT SOLN
15.0000 mL | Freq: Once | OROMUCOSAL | Status: AC
  Administered 2021-06-09: 15 mL via OROMUCOSAL
  Filled 2021-06-09: qty 15

## 2021-06-09 MED ORDER — LIDOCAINE 2% (20 MG/ML) 5 ML SYRINGE
INTRAMUSCULAR | Status: AC
  Filled 2021-06-09: qty 5

## 2021-06-09 MED ORDER — LACTATED RINGERS IV SOLN: INTRAVENOUS | Status: DC

## 2021-06-09 MED ORDER — MIDAZOLAM HCL 2 MG/2ML IJ SOLN
INTRAMUSCULAR | Status: AC
  Filled 2021-06-09: qty 2

## 2021-06-09 MED ORDER — PROPOFOL 10 MG/ML IV BOLUS
INTRAVENOUS | Status: DC | PRN
  Administered 2021-06-09: 150 mg via INTRAVENOUS

## 2021-06-09 MED ORDER — DEXAMETHASONE SODIUM PHOSPHATE 10 MG/ML IJ SOLN
INTRAMUSCULAR | Status: AC
  Filled 2021-06-09: qty 1

## 2021-06-09 MED ORDER — SODIUM CHLORIDE 0.9 % IR SOLN
Status: DC | PRN
  Administered 2021-06-09: 1000 mL

## 2021-06-09 MED ORDER — FENTANYL CITRATE (PF) 250 MCG/5ML IJ SOLN
INTRAMUSCULAR | Status: AC
  Filled 2021-06-09: qty 5

## 2021-06-09 MED ORDER — EPINEPHRINE PF 1 MG/ML IJ SOLN
INTRAMUSCULAR | Status: AC
  Filled 2021-06-09: qty 1

## 2021-06-09 MED ORDER — ORAL CARE MOUTH RINSE: 15.0000 mL | Freq: Once | OROMUCOSAL | Status: AC

## 2021-06-09 MED ORDER — EPHEDRINE 5 MG/ML INJ
INTRAVENOUS | Status: AC
  Filled 2021-06-09: qty 10

## 2021-06-09 MED ORDER — FENTANYL CITRATE (PF) 100 MCG/2ML IJ SOLN
INTRAMUSCULAR | Status: DC | PRN
  Administered 2021-06-09: 50 ug via INTRAVENOUS
  Administered 2021-06-09: 100 ug via INTRAVENOUS
  Administered 2021-06-09: 50 ug via INTRAVENOUS

## 2021-06-09 MED ORDER — ONDANSETRON HCL 4 MG/2ML IJ SOLN
INTRAMUSCULAR | Status: DC | PRN
  Administered 2021-06-09: 4 mg via INTRAVENOUS

## 2021-06-09 MED ORDER — SODIUM BICARBONATE 4.2 % IV SOLN
INTRAVENOUS | Status: DC
  Filled 2021-06-09: qty 50

## 2021-06-09 MED ORDER — MIDAZOLAM HCL 5 MG/5ML IJ SOLN
INTRAMUSCULAR | Status: DC | PRN
  Administered 2021-06-09: 2 mg via INTRAVENOUS

## 2021-06-09 MED ORDER — PROPOFOL 10 MG/ML IV BOLUS
INTRAVENOUS | Status: AC
  Filled 2021-06-09: qty 40

## 2021-06-09 MED ORDER — ROCURONIUM BROMIDE 100 MG/10ML IV SOLN
INTRAVENOUS | Status: DC | PRN
  Administered 2021-06-09: 70 mg via INTRAVENOUS

## 2021-06-09 MED ORDER — TRANEXAMIC ACID-NACL 1000-0.7 MG/100ML-% IV SOLN
1000.0000 mg | INTRAVENOUS | Status: AC
  Administered 2021-06-09: 1000 mg via INTRAVENOUS
  Filled 2021-06-09: qty 100

## 2021-06-09 MED ORDER — PHENYLEPHRINE 40 MCG/ML (10ML) SYRINGE FOR IV PUSH (FOR BLOOD PRESSURE SUPPORT)
PREFILLED_SYRINGE | INTRAVENOUS | Status: AC
  Filled 2021-06-09: qty 10

## 2021-06-09 MED ORDER — ROCURONIUM BROMIDE 10 MG/ML (PF) SYRINGE
PREFILLED_SYRINGE | INTRAVENOUS | Status: AC
  Filled 2021-06-09: qty 10

## 2021-06-09 MED ORDER — CEFAZOLIN SODIUM-DEXTROSE 2-4 GM/100ML-% IV SOLN
2.0000 g | INTRAVENOUS | Status: AC
  Administered 2021-06-09: 2 g via INTRAVENOUS
  Filled 2021-06-09: qty 100

## 2021-06-09 MED ORDER — LIDOCAINE HCL (PF) 1 % IJ SOLN
INTRAMUSCULAR | Status: AC
  Filled 2021-06-09: qty 30

## 2021-06-09 MED ORDER — SODIUM BICARBONATE 8.4 % IV SOLN
INTRAVENOUS | Status: AC
  Filled 2021-06-09: qty 50

## 2021-06-09 MED ORDER — DEXAMETHASONE SODIUM PHOSPHATE 10 MG/ML IJ SOLN
INTRAMUSCULAR | Status: DC | PRN
  Administered 2021-06-09: 10 mg via INTRAVENOUS

## 2021-06-09 MED ORDER — SODIUM BICARBONATE 4.2 % IV SOLN
INTRAVENOUS | Status: DC | PRN
  Administered 2021-06-09: 1700 mL via INTRAMUSCULAR

## 2021-06-09 MED ORDER — ONDANSETRON HCL 4 MG/2ML IJ SOLN
INTRAMUSCULAR | Status: AC
  Filled 2021-06-09: qty 2

## 2021-06-09 MED ORDER — LIDOCAINE 2% (20 MG/ML) 5 ML SYRINGE
INTRAMUSCULAR | Status: DC | PRN
  Administered 2021-06-09: 100 mg via INTRAVENOUS

## 2021-06-09 MED ORDER — BUPIVACAINE LIPOSOME 1.3 % IJ SUSP
INTRAMUSCULAR | Status: AC
  Filled 2021-06-09: qty 20

## 2021-06-09 MED ORDER — SUGAMMADEX SODIUM 500 MG/5ML IV SOLN
INTRAVENOUS | Status: DC | PRN
  Administered 2021-06-09: 200 mg via INTRAVENOUS

## 2021-06-09 SURGICAL SUPPLY — 52 items
APL PRP STRL LF DISP 70% ISPRP (MISCELLANEOUS) ×2
APL SKNCLS STERI-STRIP NONHPOA (GAUZE/BANDAGES/DRESSINGS) ×1
APPLIER CLIP 9.375 MED OPEN (MISCELLANEOUS)
APR CLP MED 9.3 20 MLT OPN (MISCELLANEOUS)
BENZOIN TINCTURE PRP APPL 2/3 (GAUZE/BANDAGES/DRESSINGS) ×2 IMPLANT
BINDER ABDOMINAL 12 ML 46-62 (SOFTGOODS) ×2 IMPLANT
BIOPATCH RED 1 DISK 7.0 (GAUZE/BANDAGES/DRESSINGS) ×4 IMPLANT
BLADE CLIPPER SURG (BLADE) ×1 IMPLANT
BLADE SURG 10 STRL SS (BLADE) ×2 IMPLANT
CANISTER SUCT 3000ML PPV (MISCELLANEOUS) ×2 IMPLANT
CHLORAPREP W/TINT 26 (MISCELLANEOUS) ×3 IMPLANT
CLIP APPLIE 9.375 MED OPEN (MISCELLANEOUS) IMPLANT
CONT SPEC PATH 64OZ SNAP LID (MISCELLANEOUS) ×2 IMPLANT
COVER SURGICAL LIGHT HANDLE (MISCELLANEOUS) ×2 IMPLANT
DRAIN CHANNEL 15F RND FF W/TCR (WOUND CARE) ×4 IMPLANT
DRAPE HALF SHEET 40X57 (DRAPES) ×2 IMPLANT
DRAPE TOP ARMCOVERS (MISCELLANEOUS) ×2 IMPLANT
DRAPE U-SHAPE 76X120 STRL (DRAPES) ×4 IMPLANT
DRAPE UTILITY XL STRL (DRAPES) ×2 IMPLANT
ELECT BLADE 4.0 EZ CLEAN MEGAD (MISCELLANEOUS) ×2
ELECT COATED BLADE 2.86 ST (ELECTRODE) ×2 IMPLANT
ELECT REM PT RETURN 9FT ADLT (ELECTROSURGICAL) ×2
ELECTRODE BLDE 4.0 EZ CLN MEGD (MISCELLANEOUS) ×1 IMPLANT
ELECTRODE REM PT RTRN 9FT ADLT (ELECTROSURGICAL) ×1 IMPLANT
EVACUATOR SILICONE 100CC (DRAIN) ×4 IMPLANT
GAUZE SPONGE 4X4 12PLY STRL (GAUZE/BANDAGES/DRESSINGS) ×4 IMPLANT
GLOVE SRG 8 PF TXTR STRL LF DI (GLOVE) ×1 IMPLANT
GLOVE SURG ENC TEXT LTX SZ7.5 (GLOVE) ×2 IMPLANT
GLOVE SURG UNDER POLY LF SZ8 (GLOVE) ×2
GOWN STRL REUS W/ TWL LRG LVL3 (GOWN DISPOSABLE) ×2 IMPLANT
GOWN STRL REUS W/TWL LRG LVL3 (GOWN DISPOSABLE) ×6
KIT BASIN OR (CUSTOM PROCEDURE TRAY) ×2 IMPLANT
NS IRRIG 1000ML POUR BTL (IV SOLUTION) ×2 IMPLANT
PACK GENERAL/GYN (CUSTOM PROCEDURE TRAY) ×2 IMPLANT
PAD ABD 8X10 STRL (GAUZE/BANDAGES/DRESSINGS) ×8 IMPLANT
PENCIL SMOKE EVACUATOR (MISCELLANEOUS) ×2 IMPLANT
PIN SAFETY STERILE (MISCELLANEOUS) ×2 IMPLANT
SHEET MEDIUM DRAPE 40X70 STRL (DRAPES) IMPLANT
SLEEVE SCD COMPRESS KNEE MED (STOCKING) ×2 IMPLANT
SPONGE LAP 18X18 RF (DISPOSABLE) ×2 IMPLANT
STAPLER INSORB 30 2030 C-SECTI (MISCELLANEOUS) ×4 IMPLANT
STRIP CLOSURE SKIN 1/2X4 (GAUZE/BANDAGES/DRESSINGS) ×2 IMPLANT
SUT ETHILON 3 0 PS 1 (SUTURE) ×4 IMPLANT
SUT MNCRL AB 3-0 PS2 27 (SUTURE) ×4 IMPLANT
SUT MNCRL AB 4-0 PS2 18 (SUTURE) IMPLANT
SUT MON AB 5-0 PS2 18 (SUTURE) IMPLANT
SUT PDS AB 0 CT 36 (SUTURE) IMPLANT
SUT PDS AB 2-0 CT2 27 (SUTURE) IMPLANT
SUT VIC AB 2-0 CTX 27 (SUTURE) ×5 IMPLANT
SUT VLOC 90 P-14 23 (SUTURE) ×6 IMPLANT
SYR BULB IRRIG 60ML STRL (SYRINGE) ×2 IMPLANT
UNDERPAD 30X36 HEAVY ABSORB (UNDERPADS AND DIAPERS) ×2 IMPLANT

## 2021-06-09 NOTE — Op Note (Signed)
Operative Note   DATE OF OPERATION: 06/09/2021  SURGICAL DEPARTMENT: Plastic Surgery  PREOPERATIVE DIAGNOSES:  Panniculitis  POSTOPERATIVE DIAGNOSES:  same  PROCEDURE:  Infraumbilical Panniculectomy  SURGEON: Ancil Linsey, MD  ASSISTANT: Enedina Finner, RNFA The advanced practice practitioner (APP) assisted throughout the case.  The APP was essential in retraction and counter traction when needed to make the case progress smoothly.  This retraction and assistance made it possible to see the tissue plans for the procedure.  The assistance was needed for blood control, tissue re-approximation and assisted with closure of the incision site.  ANESTHESIA:  General.   COMPLICATIONS: None.   INDICATIONS FOR PROCEDURE:  The patient, Alexis Frye is a 69 y.o. female born on 01/12/1951, is here for treatment of panniculitis MRN: [id number]  CONSENT:  Informed consent was obtained directly from the patient. Risks, benefits and alternatives were fully discussed. Specific risks including but not limited to bleeding, infection, hematoma, seroma, scarring, pain, contracture, asymmetry, wound healing problems, and need for further surgery were all discussed. The patient did have an ample opportunity to have questions answered to satisfaction.   DESCRIPTION OF PROCEDURE:  The patient was taken to the operating room. SCDs were placed and antibiotics were given. General anesthesia was administered.  The patient's operative site was prepped and draped in a sterile fashion. A time out was performed and all information was confirmed to be correct.  I started by marking the midline from the vulvar commissure to the umbilicus.  I then planned out the inferior incision just below the infra pannus crease.  I then estimated the superior incision and the amount of excision.  The area was then infiltrated with tumescent solution.  I started by making the inferior incision with a 10 blade.  I dissected down to the  fascia with cautery.  I then undermined superiorly to approximately the level of the umbilicus.  I used tailor tacking to estimate the accuracy of the superior incision.  Superior incision was then made with a 10 blade and the specimen was removed on the left and then the right side.  Total weight of the specimen was >2900g.  Hemostasis was meticulously obtained.  A 15 [place] JP drain was placed and secured with a nylon suture.  Closure was done with buried 2-0 Vicryl sutures for Scarpa's layer.  The skin was closed with a combination of in sorb staples and a running 3-0 V lock suture.  Steri-Strips and a soft dressing were then applied as well as an abdominal binder.  The patient tolerated the procedure well.  There were no complications. The patient was allowed to wake from anesthesia, extubated and taken to the recovery room in satisfactory condition.

## 2021-06-09 NOTE — Anesthesia Preprocedure Evaluation (Signed)
Anesthesia Evaluation  Patient identified by MRN, date of birth, ID band Patient awake    Reviewed: Allergy & Precautions, NPO status , Patient's Chart, lab work & pertinent test results  History of Anesthesia Complications (+) PONV  Airway Mallampati: II  TM Distance: >3 FB Neck ROM: Full    Dental no notable dental hx.    Pulmonary neg pulmonary ROS,    Pulmonary exam normal breath sounds clear to auscultation       Cardiovascular hypertension, Pt. on medications Normal cardiovascular exam Rhythm:Regular Rate:Normal     Neuro/Psych TIAnegative psych ROS   GI/Hepatic Neg liver ROS, GERD  Medicated,  Endo/Other  negative endocrine ROS  Renal/GU negative Renal ROS  negative genitourinary   Musculoskeletal negative musculoskeletal ROS (+)   Abdominal   Peds negative pediatric ROS (+)  Hematology negative hematology ROS (+)   Anesthesia Other Findings   Reproductive/Obstetrics negative OB ROS                             Anesthesia Physical Anesthesia Plan  ASA: 3  Anesthesia Plan: General   Post-op Pain Management:    Induction: Intravenous  PONV Risk Score and Plan: 4 or greater and Ondansetron, Dexamethasone, Scopolamine patch - Pre-op and Treatment may vary due to age or medical condition  Airway Management Planned: Oral ETT  Additional Equipment:   Intra-op Plan:   Post-operative Plan: Extubation in OR  Informed Consent: I have reviewed the patients History and Physical, chart, labs and discussed the procedure including the risks, benefits and alternatives for the proposed anesthesia with the patient or authorized representative who has indicated his/her understanding and acceptance.     Dental advisory given  Plan Discussed with: CRNA and Surgeon  Anesthesia Plan Comments:         Anesthesia Quick Evaluation

## 2021-06-09 NOTE — Anesthesia Procedure Notes (Signed)
Procedure Name: Intubation Date/Time: 06/09/2021 10:02 AM Performed by: Jonna Munro, CRNA Pre-anesthesia Checklist: Patient identified, Patient being monitored, Timeout performed, Emergency Drugs available and Suction available Patient Re-evaluated:Patient Re-evaluated prior to induction Oxygen Delivery Method: Circle System Utilized Preoxygenation: Pre-oxygenation with 100% oxygen Induction Type: IV induction Ventilation: Mask ventilation without difficulty Laryngoscope Size: Mac and 3 Grade View: Grade II Tube type: Oral Tube size: 7.0 mm Number of attempts: 1 Airway Equipment and Method: stylet Placement Confirmation: ETT inserted through vocal cords under direct vision, positive ETCO2 and breath sounds checked- equal and bilateral Secured at: 22 cm Tube secured with: Tape Dental Injury: Teeth and Oropharynx as per pre-operative assessment

## 2021-06-09 NOTE — Brief Op Note (Signed)
06/09/2021  11:35 AM  PATIENT:  Alexis Frye  70 y.o. female  PRE-OPERATIVE DIAGNOSIS:  panniculitis  POST-OPERATIVE DIAGNOSIS:  * No post-op diagnosis entered *  PROCEDURE:  Procedure(s) with comments: Infraumbilical panniculectomy (N/A) - 90 min  SURGEON:  Surgeon(s) and Role:    * Lauris Serviss, Wendy Poet, MD - Primary  PHYSICIAN ASSISTANT: Enedina Finner, RNFA  ASSISTANTS: none   ANESTHESIA:   General  EBL:  100 mL   BLOOD ADMINISTERED:none  DRAINS: (2) Jackson-Pratt drain(s) with closed bulb suction in the abdomen    LOCAL MEDICATIONS USED:  XYLOCAINE   SPECIMEN:  Source of Specimen:  pannus  DISPOSITION OF SPECIMEN:  PATHOLOGY  COUNTS:  YES  TOURNIQUET:  * No tourniquets in log *  DICTATION: .Dragon Dictation  PLAN OF CARE: Discharge to home after PACU  PATIENT DISPOSITION:  PACU - hemodynamically stable.   Delay start of Pharmacological VTE agent (>24hrs) due to surgical blood loss or risk of bleeding: not applicable

## 2021-06-09 NOTE — Interval H&P Note (Signed)
History and Physical Interval Note:  06/09/2021 9:15 AM  Alexis Frye  has presented today for surgery, with the diagnosis of panniculitis.  The various methods of treatment have been discussed with the patient and family. After consideration of risks, benefits and other options for treatment, the patient has consented to  Procedure(s) with comments: Infraumbilical panniculectomy (N/A) - 90 min as a surgical intervention.  The patient's history has been reviewed, patient examined, no change in status, stable for surgery.  I have reviewed the patient's chart and labs.  Questions were answered to the patient's satisfaction.     Allena Napoleon

## 2021-06-09 NOTE — Transfer of Care (Signed)
Immediate Anesthesia Transfer of Care Note  Patient: Alexis Frye  Procedure(s) Performed: Infraumbilical panniculectomy (Abdomen)  Patient Location: PACU  Anesthesia Type:General  Level of Consciousness: awake, alert , oriented and patient cooperative  Airway & Oxygen Therapy: Patient Spontanous Breathing and Patient connected to face mask oxygen  Post-op Assessment: Report given to RN, Post -op Vital signs reviewed and stable and Patient moving all extremities X 4  Post vital signs: Reviewed and stable  Last Vitals:  Vitals Value Taken Time  BP    Temp    Pulse 72 06/09/21 1150  Resp 15 06/09/21 1150  SpO2 96 % 06/09/21 1150  Vitals shown include unvalidated device data.  Last Pain:  Vitals:   06/09/21 0800  TempSrc:   PainSc: 0-No pain      Patients Stated Pain Goal: 4 (06/09/21 0800)  Complications: No notable events documented.

## 2021-06-09 NOTE — Anesthesia Postprocedure Evaluation (Signed)
Anesthesia Post Note  Patient: Alexis Frye  Procedure(s) Performed: Infraumbilical panniculectomy (Abdomen)     Patient location during evaluation: PACU Anesthesia Type: General Level of consciousness: awake and alert Pain management: pain level controlled Vital Signs Assessment: post-procedure vital signs reviewed and stable Respiratory status: spontaneous breathing, nonlabored ventilation, respiratory function stable and patient connected to nasal cannula oxygen Cardiovascular status: blood pressure returned to baseline and stable Postop Assessment: no apparent nausea or vomiting Anesthetic complications: no   No notable events documented.  Last Vitals:  Vitals:   06/09/21 1220 06/09/21 1235  BP: 138/79   Pulse: 63   Resp: 14   Temp:    SpO2: 95% 94%    Last Pain:  Vitals:   06/09/21 1220  TempSrc:   PainSc: 3                  Travarius Lange S

## 2021-06-09 NOTE — Discharge Instructions (Addendum)
Activity: As tolerated, but avoid strenuous activity until follow up visit.  Diet: Regular  Wound Care: Keep dressing clean & dry for 2 days.  After that you can shower normally.  Redress the wound as needed for comfort.  Empty and record drain output.  Special Instructions:  Call our office if any unusual problems occur such as pain, excessive bleeding, unrelieved nausea/vomiting, fever &/or chills.  Follow-up appointment: Scheduled for next week. About my Jackson-Pratt Bulb Drain  What is a Jackson-Pratt bulb? A Jackson-Pratt is a soft, round device used to collect drainage. It is connected to a long, thin drainage catheter, which is held in place by one or two small stiches near your surgical incision site. When the bulb is squeezed, it forms a vacuum, forcing the drainage to empty into the bulb.  Emptying the Jackson-Pratt bulb- To empty the bulb: 1. Release the plug on the top of the bulb. 2. Pour the bulb's contents into a measuring container which your nurse will provide. 3. Record the time emptied and amount of drainage. Empty the drain(s) as often as your     doctor or nurse recommends.  Date                  Time                    Amount (Drain 1)                 Amount (Drain 2)  _____________________________________________________________________  _____________________________________________________________________  _____________________________________________________________________  _____________________________________________________________________  _____________________________________________________________________  _____________________________________________________________________  _____________________________________________________________________  _____________________________________________________________________  Squeezing the Jackson-Pratt Bulb- To squeeze the bulb: 1. Make sure the plug at the top of the bulb is open. 2. Squeeze the  bulb tightly in your fist. You will hear air squeezing from the bulb. 3. Replace the plug while the bulb is squeezed. 4. Use a safety pin to attach the bulb to your clothing. This will keep the catheter from     pulling at the bulb insertion site.  When to call your doctor- Call your doctor if: Drain site becomes red, swollen or hot. You have a fever greater than 101 degrees F. There is oozing at the drain site. Drain falls out (apply a guaze bandage over the drain hole and secure it with tape). Drainage increases daily not related to activity patterns. (You will usually have more drainage when you are active than when you are resting.) Drainage has a bad odor.

## 2021-06-10 ENCOUNTER — Encounter (HOSPITAL_COMMUNITY): Payer: Self-pay | Admitting: Plastic Surgery

## 2021-06-10 LAB — SURGICAL PATHOLOGY

## 2021-06-12 ENCOUNTER — Telehealth: Payer: Self-pay

## 2021-06-12 NOTE — Telephone Encounter (Signed)
Returned patients call. Advised she is able to take a shower after 3 full complete days from date of surgery, remove binder while in shower, do not remove steri strip, hold both bulbs in one hand, wash with other hand, avoid incision area, let soap run down body, carefully pat dry incision area and place binder back on. Keep binder on 24/7 unless taking a shower. Patient understood and agreed with plan.

## 2021-06-12 NOTE — Telephone Encounter (Signed)
Patient said she had a surgery on Monday and she would like someone to walk her through the process for showering.  Please call.

## 2021-06-18 ENCOUNTER — Other Ambulatory Visit: Payer: Self-pay

## 2021-06-18 ENCOUNTER — Ambulatory Visit (INDEPENDENT_AMBULATORY_CARE_PROVIDER_SITE_OTHER): Payer: Medicare Other | Admitting: Plastic Surgery

## 2021-06-18 DIAGNOSIS — M793 Panniculitis, unspecified: Secondary | ICD-10-CM

## 2021-06-18 NOTE — Progress Notes (Signed)
Patient presents about 1 week postop from infraumbilical panniculectomy.  Drains are putting out around 40 cc a day on the right and 10 cc a day on the left.  Left drain was removed.  Overall she is having some pain and discomfort but seems to be coming along okay.  Incision looks intact with no signs of infection or subcutaneous fluid.  We will plan to see her next week to remove the second drain.  All of her questions were answered.

## 2021-06-26 ENCOUNTER — Ambulatory Visit (INDEPENDENT_AMBULATORY_CARE_PROVIDER_SITE_OTHER): Payer: Medicare Other

## 2021-06-26 ENCOUNTER — Other Ambulatory Visit: Payer: Self-pay

## 2021-06-26 VITALS — BP 136/80 | HR 74 | Temp 97.8°F | Ht 63.0 in | Wt 187.0 lb

## 2021-06-26 DIAGNOSIS — M793 Panniculitis, unspecified: Secondary | ICD-10-CM

## 2021-07-08 ENCOUNTER — Telehealth: Payer: Self-pay | Admitting: Plastic Surgery

## 2021-07-08 NOTE — Telephone Encounter (Signed)
Patient called to advise that on the right side, at the top of her incision is still very tender to the touch. She also feels a hard, firm lump. She wants to know if this is normal. Please call to advise.

## 2021-07-10 NOTE — Progress Notes (Signed)
06/26/21 Patient in office today for postop f/u. S/P panniculectomy by Dr. Arita Miss on 06/09/21 Incisions are healing well.  Everything is intact. Drainage right side = < 41ml/24hrs. I removed the drain after assessment & was unable to identify any asymmetry d/t swelling/fluid & unable to express any measurable amount of drainage from the drain site. The drain site is red/irritated.  I applied vaseline/gauze & tape & instructed pt to do the same until this heals. She denies any chills/fever, no n/v.  Pain is intermittent & is rated 4/10- controlled by OTC Ibuprofen & tylenol. She is reminded to call for any concerns.

## 2021-07-13 NOTE — Patient Instructions (Signed)
She will use vaseline/gauze to drain site until healed. She will call for any concerns

## 2021-07-25 ENCOUNTER — Telehealth: Payer: Self-pay

## 2021-07-25 NOTE — Telephone Encounter (Signed)
Call to pt regarding the following: She has been concerned about an area at the top of her abdominal incision- close to midline that has been red/irritated.  She reports today that this area has definitely improved & she reports that there is a "scab: that has formed over this place & she has no real pain or drainage. She 1st thought that there may be a "stitch" protruding there- but she can't locate this now. I informed her that if there is a remnant of suture - the body most likely has resolved it- but if not we can assist in removing if at her next p/o visit. She has a f/u visit with Dr. Arita Miss on 08/27/21- but she is reminded to call for any concerns or changes.

## 2021-08-27 ENCOUNTER — Encounter: Payer: Self-pay | Admitting: Plastic Surgery

## 2021-08-27 ENCOUNTER — Other Ambulatory Visit: Payer: Self-pay

## 2021-08-27 ENCOUNTER — Ambulatory Visit (INDEPENDENT_AMBULATORY_CARE_PROVIDER_SITE_OTHER): Payer: Medicare Other | Admitting: Plastic Surgery

## 2021-08-27 DIAGNOSIS — M793 Panniculitis, unspecified: Secondary | ICD-10-CM

## 2021-08-27 DIAGNOSIS — M67449 Ganglion, unspecified hand: Secondary | ICD-10-CM

## 2021-08-27 NOTE — Progress Notes (Signed)
Patient presents about 2 months postop from infraumbilical panniculectomy.  She did experience more pain than usual initially but feels like the pain is improved.  She still does have 2 small areas of delayed healing along her incisions laterally that are draining a very small amount of fluid.  On exam these looks to be areas around some buried suture inflammation.  I do not see any extruding sutures or staples there.  I do hope that these will gradually fade away with time.  She also pointed out to me a mucous cyst or DIP ganglion on the dorsal aspect of her right long finger.  This is been present for a while and is painful.  She has normal range of motion of the finger now.  The cyst was drained with a needle by what I believe is her primary care provider but it recurred.  I discussed management of the DIP ganglion.  I explained that we would need to address the connection between the cyst itself and the joint.  She seemed understand this concept.  We discussed the risks include bleeding, infection, damage to surrounding structures and need for additional procedures.  We discussed the anticipated recovery time.  She is fully understanding and wants to proceed.

## 2021-08-27 NOTE — H&P (View-Only) (Signed)
Patient presents about 2 months postop from infraumbilical panniculectomy.  She did experience more pain than usual initially but feels like the pain is improved.  She still does have 2 small areas of delayed healing along her incisions laterally that are draining a very small amount of fluid.  On exam these looks to be areas around some buried suture inflammation.  I do not see any extruding sutures or staples there.  I do hope that these will gradually fade away with time.  She also pointed out to me a mucous cyst or DIP ganglion on the dorsal aspect of her right long finger.  This is been present for a while and is painful.  She has normal range of motion of the finger now.  The cyst was drained with a needle by what I believe is her primary care provider but it recurred.  I discussed management of the DIP ganglion.  I explained that we would need to address the connection between the cyst itself and the joint.  She seemed understand this concept.  We discussed the risks include bleeding, infection, damage to surrounding structures and need for additional procedures.  We discussed the anticipated recovery time.  She is fully understanding and wants to proceed. 

## 2021-09-09 ENCOUNTER — Encounter (HOSPITAL_BASED_OUTPATIENT_CLINIC_OR_DEPARTMENT_OTHER): Payer: Self-pay | Admitting: Plastic Surgery

## 2021-09-09 ENCOUNTER — Other Ambulatory Visit: Payer: Self-pay

## 2021-09-16 ENCOUNTER — Ambulatory Visit (HOSPITAL_BASED_OUTPATIENT_CLINIC_OR_DEPARTMENT_OTHER)
Admission: RE | Admit: 2021-09-16 | Discharge: 2021-09-16 | Disposition: A | Discharge status: Home / Self Care | Payer: Medicare Other | Attending: Plastic Surgery | Admitting: Plastic Surgery

## 2021-09-16 ENCOUNTER — Ambulatory Visit (HOSPITAL_BASED_OUTPATIENT_CLINIC_OR_DEPARTMENT_OTHER): Payer: Medicare Other | Admitting: Anesthesiology

## 2021-09-16 ENCOUNTER — Encounter (HOSPITAL_BASED_OUTPATIENT_CLINIC_OR_DEPARTMENT_OTHER)
Admission: RE | Disposition: A | Discharge status: Home / Self Care | Payer: Self-pay | Source: Home / Self Care | Attending: Plastic Surgery

## 2021-09-16 ENCOUNTER — Encounter (HOSPITAL_BASED_OUTPATIENT_CLINIC_OR_DEPARTMENT_OTHER): Payer: Self-pay | Admitting: Plastic Surgery

## 2021-09-16 ENCOUNTER — Other Ambulatory Visit: Payer: Self-pay

## 2021-09-16 DIAGNOSIS — M67441 Ganglion, right hand: Secondary | ICD-10-CM

## 2021-09-16 HISTORY — PX: CYST EXCISION: SHX5701

## 2021-09-16 SURGERY — CYST REMOVAL
Anesthesia: Monitor Anesthesia Care | Site: Finger | Laterality: Right

## 2021-09-16 MED ORDER — LIDOCAINE HCL (PF) 1 % IJ SOLN
INTRAMUSCULAR | Status: AC
  Filled 2021-09-16: qty 30

## 2021-09-16 MED ORDER — MIDAZOLAM HCL 2 MG/2ML IJ SOLN
INTRAMUSCULAR | Status: AC
  Filled 2021-09-16: qty 2

## 2021-09-16 MED ORDER — CEFAZOLIN SODIUM-DEXTROSE 2-4 GM/100ML-% IV SOLN
INTRAVENOUS | Status: AC
  Filled 2021-09-16: qty 100

## 2021-09-16 MED ORDER — CEFAZOLIN SODIUM-DEXTROSE 2-4 GM/100ML-% IV SOLN
2.0000 g | INTRAVENOUS | Status: AC
Start: 2021-09-16 — End: 2021-09-16
  Administered 2021-09-16: 2 g via INTRAVENOUS

## 2021-09-16 MED ORDER — ONDANSETRON HCL 4 MG PO TABS: 4.0000 mg | ORAL_TABLET | Freq: Three times a day (TID) | ORAL | 0 refills | Status: DC | PRN

## 2021-09-16 MED ORDER — ONDANSETRON HCL 4 MG/2ML IJ SOLN
INTRAMUSCULAR | Status: DC | PRN
  Administered 2021-09-16: 4 mg via INTRAVENOUS

## 2021-09-16 MED ORDER — FENTANYL CITRATE (PF) 100 MCG/2ML IJ SOLN
INTRAMUSCULAR | Status: AC
  Filled 2021-09-16: qty 2

## 2021-09-16 MED ORDER — PROPOFOL 500 MG/50ML IV EMUL
INTRAVENOUS | Status: DC | PRN
  Administered 2021-09-16: 200 ug/kg/min via INTRAVENOUS

## 2021-09-16 MED ORDER — BUPIVACAINE-EPINEPHRINE 0.25% -1:200000 IJ SOLN
INTRAMUSCULAR | Status: DC | PRN
  Administered 2021-09-16: 10 mL

## 2021-09-16 MED ORDER — MIDAZOLAM HCL 5 MG/5ML IJ SOLN
INTRAMUSCULAR | Status: DC | PRN
  Administered 2021-09-16: 2 mg via INTRAVENOUS

## 2021-09-16 MED ORDER — 0.9 % SODIUM CHLORIDE (POUR BTL) OPTIME
TOPICAL | Status: DC | PRN
  Administered 2021-09-16: 100 mL

## 2021-09-16 MED ORDER — LACTATED RINGERS IV SOLN: INTRAVENOUS | Status: DC

## 2021-09-16 MED ORDER — BUPIVACAINE-EPINEPHRINE (PF) 0.25% -1:200000 IJ SOLN
INTRAMUSCULAR | Status: AC
  Filled 2021-09-16: qty 30

## 2021-09-16 MED ORDER — FENTANYL CITRATE (PF) 100 MCG/2ML IJ SOLN
INTRAMUSCULAR | Status: DC | PRN
  Administered 2021-09-16: 100 ug via INTRAVENOUS

## 2021-09-16 MED ORDER — TRAMADOL HCL 50 MG PO TABS: 50.0000 mg | ORAL_TABLET | Freq: Three times a day (TID) | ORAL | 0 refills | Status: AC | PRN

## 2021-09-16 MED ORDER — ONDANSETRON HCL 4 MG/2ML IJ SOLN
INTRAMUSCULAR | Status: AC
  Filled 2021-09-16: qty 2

## 2021-09-16 SURGICAL SUPPLY — 69 items
APL PRP STRL LF DISP 70% ISPRP (MISCELLANEOUS) ×1
BAG DECANTER FOR FLEXI CONT (MISCELLANEOUS) IMPLANT
BLADE MINI RND TIP GREEN BEAV (BLADE) IMPLANT
BLADE SURG 15 STRL LF DISP TIS (BLADE) ×1 IMPLANT
BLADE SURG 15 STRL SS (BLADE) ×2
BNDG CMPR 9X4 STRL LF SNTH (GAUZE/BANDAGES/DRESSINGS)
BNDG COHESIVE 1X5 TAN STRL LF (GAUZE/BANDAGES/DRESSINGS) ×1 IMPLANT
BNDG COHESIVE 3X5 TAN ST LF (GAUZE/BANDAGES/DRESSINGS) ×2 IMPLANT
BNDG ESMARK 4X9 LF (GAUZE/BANDAGES/DRESSINGS) ×1 IMPLANT
BNDG GAUZE ELAST 4 BULKY (GAUZE/BANDAGES/DRESSINGS) ×1 IMPLANT
CHLORAPREP W/TINT 26 (MISCELLANEOUS) ×2 IMPLANT
CORD BIPOLAR FORCEPS 12FT (ELECTRODE) ×2 IMPLANT
COVER BACK TABLE 60X90IN (DRAPES) ×2 IMPLANT
COVER MAYO STAND STRL (DRAPES) ×2 IMPLANT
CUFF TOURN SGL QUICK 18X4 (TOURNIQUET CUFF) ×1 IMPLANT
DECANTER SPIKE VIAL GLASS SM (MISCELLANEOUS) IMPLANT
DRAIN PENROSE 1/2X12 LTX STRL (WOUND CARE) ×1 IMPLANT
DRAPE EXTREMITY T 121X128X90 (DISPOSABLE) ×2 IMPLANT
DRAPE SURG 17X23 STRL (DRAPES) ×2 IMPLANT
DRSG PAD ABDOMINAL 8X10 ST (GAUZE/BANDAGES/DRESSINGS) IMPLANT
GAUZE 4X4 16PLY ~~LOC~~+RFID DBL (SPONGE) IMPLANT
GAUZE SPONGE 4X4 12PLY STRL (GAUZE/BANDAGES/DRESSINGS) ×2 IMPLANT
GAUZE XEROFORM 1X8 LF (GAUZE/BANDAGES/DRESSINGS) ×2 IMPLANT
GLOVE SRG 8 PF TXTR STRL LF DI (GLOVE) ×1 IMPLANT
GLOVE SURG ENC TEXT LTX SZ7.5 (GLOVE) ×2 IMPLANT
GLOVE SURG UNDER POLY LF SZ8 (GLOVE) ×2
GOWN STRL REUS W/ TWL LRG LVL3 (GOWN DISPOSABLE) ×2 IMPLANT
GOWN STRL REUS W/TWL LRG LVL3 (GOWN DISPOSABLE) ×4
LOOP VESSEL MAXI BLUE (MISCELLANEOUS) IMPLANT
NDL HYPO 25X1 1.5 SAFETY (NEEDLE) IMPLANT
NDL KEITH (NEEDLE) IMPLANT
NDL PRECISIONGLIDE 27X1.5 (NEEDLE) IMPLANT
NEEDLE HYPO 25X1 1.5 SAFETY (NEEDLE) IMPLANT
NEEDLE KEITH (NEEDLE) IMPLANT
NEEDLE PRECISIONGLIDE 27X1.5 (NEEDLE) ×2 IMPLANT
NS IRRIG 1000ML POUR BTL (IV SOLUTION) ×2 IMPLANT
PACK BASIN DAY SURGERY FS (CUSTOM PROCEDURE TRAY) ×2 IMPLANT
PAD CAST 3X4 CTTN HI CHSV (CAST SUPPLIES) ×1 IMPLANT
PAD CAST 4YDX4 CTTN HI CHSV (CAST SUPPLIES) IMPLANT
PADDING CAST ABS 3INX4YD NS (CAST SUPPLIES)
PADDING CAST ABS 4INX4YD NS (CAST SUPPLIES) ×1
PADDING CAST ABS COTTON 3X4 (CAST SUPPLIES) IMPLANT
PADDING CAST ABS COTTON 4X4 ST (CAST SUPPLIES) ×1 IMPLANT
PADDING CAST COTTON 3X4 STRL (CAST SUPPLIES) ×2
PADDING CAST COTTON 4X4 STRL (CAST SUPPLIES)
SLEEVE SCD COMPRESS KNEE MED (STOCKING) IMPLANT
SPLINT PLASTER CAST XFAST 3X15 (CAST SUPPLIES) IMPLANT
SPLINT PLASTER XTRA FASTSET 3X (CAST SUPPLIES)
STOCKINETTE 4X48 STRL (DRAPES) ×2 IMPLANT
SUT CHROMIC 5 0 P 3 (SUTURE) ×1 IMPLANT
SUT ETHIBOND 3-0 V-5 (SUTURE) IMPLANT
SUT ETHILON 3 0 PS 1 (SUTURE) IMPLANT
SUT ETHILON 4 0 PS 2 18 (SUTURE) ×2 IMPLANT
SUT FIBERWIRE 4-0 18 DIAM BLUE (SUTURE)
SUT MERSILENE 2.0 SH NDLE (SUTURE) IMPLANT
SUT MERSILENE 4 0 P 3 (SUTURE) IMPLANT
SUT PROLENE 2 0 SH DA (SUTURE) IMPLANT
SUT PROLENE 5 0 P 3 (SUTURE) IMPLANT
SUT SILK 4 0 PS 2 (SUTURE) IMPLANT
SUT SUPRAMID 4-0 (SUTURE) IMPLANT
SUT VIC AB 4-0 P-3 18XBRD (SUTURE) IMPLANT
SUT VIC AB 4-0 P3 18 (SUTURE)
SUT VICRYL 4-0 PS2 18IN ABS (SUTURE) IMPLANT
SUTURE FIBERWR 4-0 18 DIA BLUE (SUTURE) IMPLANT
SYR BULB EAR ULCER 3OZ GRN STR (SYRINGE) ×2 IMPLANT
SYR CONTROL 10ML LL (SYRINGE) IMPLANT
TOWEL GREEN STERILE FF (TOWEL DISPOSABLE) ×2 IMPLANT
TUBE FEEDING ENTERAL 5FR 16IN (TUBING) IMPLANT
UNDERPAD 30X36 HEAVY ABSORB (UNDERPADS AND DIAPERS) ×2 IMPLANT

## 2021-09-16 NOTE — Anesthesia Postprocedure Evaluation (Signed)
Anesthesia Post Note  Patient: DARSHA ZUMSTEIN  Procedure(s) Performed: Excision of right long finger DIP ganglion cyst (Right: Finger)     Patient location during evaluation: PACU Anesthesia Type: MAC Level of consciousness: awake and alert Pain management: pain level controlled Vital Signs Assessment: post-procedure vital signs reviewed and stable Respiratory status: spontaneous breathing, nonlabored ventilation and respiratory function stable Cardiovascular status: blood pressure returned to baseline and stable Postop Assessment: no apparent nausea or vomiting Anesthetic complications: no   No notable events documented.  Last Vitals:  Vitals:   09/16/21 1330 09/16/21 1402  BP: 114/85 126/67  Pulse: 63 63  Resp: 17 16  Temp:  36.4 C  SpO2: 94% 96%    Last Pain:  Vitals:   09/16/21 1350  TempSrc:   PainSc: 0-No pain                 Lidia Collum

## 2021-09-16 NOTE — Anesthesia Preprocedure Evaluation (Signed)
Anesthesia Evaluation  Patient identified by MRN, date of birth, ID band Patient awake    Reviewed: Allergy & Precautions, NPO status , Patient's Chart, lab work & pertinent test results  History of Anesthesia Complications Negative for: history of anesthetic complications  Airway Mallampati: II  TM Distance: >3 FB Neck ROM: Full    Dental  (+) Teeth Intact   Pulmonary sleep apnea ,    Pulmonary exam normal        Cardiovascular hypertension, Pt. on medications + CAD (nonobstructive)  Normal cardiovascular exam   Echo 2021: EF 60-65%, g1dd, normal RV function, normal AV/MV Cath 2017: minimal nonobstructive CAD, normal LV function   Neuro/Psych TIA (2012, 2021)   GI/Hepatic Neg liver ROS, GERD  ,  Endo/Other  negative endocrine ROS  Renal/GU negative Renal ROS  negative genitourinary   Musculoskeletal negative musculoskeletal ROS (+)   Abdominal   Peds  Hematology negative hematology ROS (+)   Anesthesia Other Findings   Reproductive/Obstetrics                            Anesthesia Physical Anesthesia Plan  ASA: 2  Anesthesia Plan: MAC   Post-op Pain Management:    Induction: Intravenous  PONV Risk Score and Plan: 2 and Propofol infusion, TIVA and Treatment may vary due to age or medical condition  Airway Management Planned: Natural Airway, Nasal Cannula and Simple Face Mask  Additional Equipment: None  Intra-op Plan:   Post-operative Plan:   Informed Consent: I have reviewed the patients History and Physical, chart, labs and discussed the procedure including the risks, benefits and alternatives for the proposed anesthesia with the patient or authorized representative who has indicated his/her understanding and acceptance.       Plan Discussed with:   Anesthesia Plan Comments:         Anesthesia Quick Evaluation

## 2021-09-16 NOTE — Brief Op Note (Signed)
09/16/2021  1:22 PM  PATIENT:  Alexis Frye  70 y.o. female  PRE-OPERATIVE DIAGNOSIS:  Ganglion Cyst of Finger  POST-OPERATIVE DIAGNOSIS:  Ganglion Cyst of Finger  PROCEDURE:  Procedure(s): Excision of right long finger DIP ganglion cyst (Right)  SURGEON:  Surgeon(s) and Role:    * Teoman Giraud, Wendy Poet, MD - Primary  PHYSICIAN ASSISTANT: Materials engineer, PA  ASSISTANTS: none   ANESTHESIA:   local  EBL:  15 mL   BLOOD ADMINISTERED:none  DRAINS: none   LOCAL MEDICATIONS USED:  MARCAINE     SPECIMEN:  Source of Specimen:  right long finger ganglion cyst  DISPOSITION OF SPECIMEN:  PATHOLOGY  COUNTS:  YES  TOURNIQUET:  * No tourniquets in log *  DICTATION: .Dragon Dictation  PLAN OF CARE: Discharge to home after PACU  PATIENT DISPOSITION:  PACU - hemodynamically stable.   Delay start of Pharmacological VTE agent (>24hrs) due to surgical blood loss or risk of bleeding: not applicable

## 2021-09-16 NOTE — Discharge Instructions (Addendum)
No heavy activities.  Diet: Regular  Wound Care: Keep dressing clean & dry.  Do not get splint wet.  You can shower but make sure to place a bag over the splint with a good seal to keep it dry.  You can adjust dressing as needed, but always have splint on. You should purchase some 4x4 gauze, coban from pharmacy for dressing changes. (Can change every other day or as needed after 1 week)  Call doctor if any unusual problems occur such as pain, excessive bleeding, unrelieved nausea/vomiting, fever &/or chills.  Follow-up appointment: 2 week follow up scheduled.      Post Anesthesia Home Care Instructions  Activity: Get plenty of rest for the remainder of the day. A responsible individual must stay with you for 24 hours following the procedure.  For the next 24 hours, DO NOT: -Drive a car -Advertising copywriter -Drink alcoholic beverages -Take any medication unless instructed by your physician -Make any legal decisions or sign important papers.  Meals: Start with liquid foods such as gelatin or soup. Progress to regular foods as tolerated. Avoid greasy, spicy, heavy foods. If nausea and/or vomiting occur, drink only clear liquids until the nausea and/or vomiting subsides. Call your physician if vomiting continues.  Special Instructions/Symptoms: Your throat may feel dry or sore from the anesthesia or the breathing tube placed in your throat during surgery. If this causes discomfort, gargle with warm salt water. The discomfort should disappear within 24 hours.  If you had a scopolamine patch placed behind your ear for the management of post- operative nausea and/or vomiting:  1. The medication in the patch is effective for 72 hours, after which it should be removed.  Wrap patch in a tissue and discard in the trash. Wash hands thoroughly with soap and water. 2. You may remove the patch earlier than 72 hours if you experience unpleasant side effects which may include dry mouth, dizziness or  visual disturbances. 3. Avoid touching the patch. Wash your hands with soap and water after contact with the patch.

## 2021-09-16 NOTE — Interval H&P Note (Signed)
History and Physical Interval Note:  09/16/2021 11:59 AM  [name]  has presented today for surgery, with the diagnosis of Ganglion Cyst of Finger.  The various methods of treatment have been discussed with the patient and family. After consideration of risks, benefits and other options for treatment, the patient has consented to  Procedure(s): Excision of right long finger DIP ganglion cyst (Right) as a surgical intervention.  The patient's history has been reviewed, patient examined, no change in status, stable for surgery.  I have reviewed the patient's chart and labs.  Questions were answered to the patient's satisfaction.     Alexis Frye

## 2021-09-16 NOTE — Op Note (Signed)
Operative Note   DATE OF OPERATION: 09/16/2021  SURGICAL DEPARTMENT: Plastic Surgery  PREOPERATIVE DIAGNOSES: Right long finger DIP ganglion  POSTOPERATIVE DIAGNOSES:  same  PROCEDURE: 1.  Excision right long finger DIP ganglion cyst 2.  Open debridement right long finger DIP joint  SURGEON: Talmadge Coventry, MD  ASSISTANT: Verdie Shire, PA The advanced practice practitioner (APP) assisted throughout the case.  The APP was essential in retraction and counter traction when needed to make the case progress smoothly.  This retraction and assistance made it possible to see the tissue planes for the procedure.  The assistance was needed for hemostasis, tissue re-approximation and closure of the incision site.   ANESTHESIA:  General.   COMPLICATIONS: None.   INDICATIONS FOR PROCEDURE:  The patient, Alexis Frye is a 70 y.o. female born on Jul 11, 1951, is here for treatment of symptomatic right long finger DIP ganglion cyst MRN: [id number]  CONSENT:  Informed consent was obtained directly from the patient. Risks, benefits and alternatives were fully discussed. Specific risks including but not limited to bleeding, infection, hematoma, seroma, scarring, pain, contracture, asymmetry, wound healing problems, and need for further surgery were all discussed. The patient did have an ample opportunity to have questions answered to satisfaction.   DESCRIPTION OF PROCEDURE:  The patient was taken to the operating room. SCDs were placed and antibiotics were given.  MAC anesthesia was administered.  The patient's operative site was prepped and draped in a sterile fashion. A time out was performed and all information was confirmed to be correct.  Started by performing a digital block by with Marcaine with epinephrine.  This was given time to work.  A Penrose was then used to fashion a finger tourniquet.  The cyst was focused on the radial aspect of the DIP joint so a lateral incision was made with a 15  blade on the radial side.  This was carried down to the bone.  Dissection was then carried out dorsal to the joint surface and the joint was opened.  Debridement of the dorsal aspect of the joint was then performed with rongeurs.  The cyst was then excised with a 15 blade and sent for pathology.  This generated a T shaped incision.  This was closed with interrupted mattress 4-0 chromic sutures.  A volar finger splint was then applied to keep the DIP and PIP joints in full extension.  The patient tolerated the procedure well.  There were no complications. The patient was allowed to wake from anesthesia, extubated and taken to the recovery room in satisfactory condition.

## 2021-09-16 NOTE — Transfer of Care (Signed)
Immediate Anesthesia Transfer of Care Note  Patient: Alexis Frye  Procedure(s) Performed: Excision of right long finger DIP ganglion cyst (Right: Finger)  Patient Location: PACU  Anesthesia Type:General  Level of Consciousness: awake, alert  and oriented  Airway & Oxygen Therapy: Patient Spontanous Breathing and Patient connected to face mask oxygen  Post-op Assessment: Report given to RN and Post -op Vital signs reviewed and stable  Post vital signs: Reviewed and stable  Last Vitals:  Vitals Value Taken Time  BP    Temp    Pulse    Resp    SpO2      Last Pain:  Vitals:   09/16/21 1022  TempSrc: Oral  PainSc: 0-No pain         Complications: No notable events documented.

## 2021-09-17 ENCOUNTER — Encounter (HOSPITAL_BASED_OUTPATIENT_CLINIC_OR_DEPARTMENT_OTHER): Payer: Self-pay | Admitting: Plastic Surgery

## 2021-09-17 LAB — SURGICAL PATHOLOGY

## 2021-09-26 ENCOUNTER — Telehealth: Payer: Self-pay

## 2021-09-26 NOTE — Telephone Encounter (Signed)
Patient called to say that Dr. Arita Miss removed a ganglion cyst from her right middle finger on 09/16/21 and her finger has become really swollen and red.  Patient said one area looks like it might be starting to fester.  Patient said she is concerned about infection.  Please call.

## 2021-09-26 NOTE — Telephone Encounter (Signed)
Returned patients call. She is concerned finger is still swollen, red above the suture and white on surface. Patient denies any fevers, chills nausea, vomiting, nor odor. Advised patient to upload photo via Mychart so that Dr.Pace can observe Monday. If it continues to get worse over the weekend, go to the [facility] [place]. Otherwise we will see her at next follow up appointment 09/26/2021. Patient understood and agreed with plan.

## 2021-10-01 ENCOUNTER — Ambulatory Visit (INDEPENDENT_AMBULATORY_CARE_PROVIDER_SITE_OTHER): Payer: Medicare Other | Admitting: Surgical

## 2021-10-01 ENCOUNTER — Other Ambulatory Visit: Payer: Self-pay

## 2021-10-01 DIAGNOSIS — M67449 Ganglion, unspecified hand: Secondary | ICD-10-CM

## 2021-10-01 NOTE — Progress Notes (Signed)
Patient is a 70 year old female here for follow-up after excision of right long finger DIP ganglion cyst with Dr. Arita Miss on 09/16/2021.  She is 2 weeks postop.  She reports overall she is doing well.  She is not having any infectious symptoms.  She has no concerns at this time.  On exam right long finger incision is intact and healing well.  Sutures are in place.  The skin flaps are viable.  There is no erythema or cellulitic changes noted.  There is some finger swelling and decreased range of motion related to the swelling, but no signs of concern at this time.  Recommend keeping the area clean.  No additional dressing changes necessary at this time.  Recommend avoiding any significant range of motion of the finger to allow the incisions to continue to heal.  Discussed increase range of motion as able with decreased swelling.  Recommend calling with questions or concerns.  Recommend following up in 3 weeks for reevaluation.  No signs of infection on exam.

## 2021-10-23 ENCOUNTER — Ambulatory Visit (INDEPENDENT_AMBULATORY_CARE_PROVIDER_SITE_OTHER): Payer: Medicare Other | Admitting: Surgical

## 2021-10-23 ENCOUNTER — Other Ambulatory Visit: Payer: Self-pay

## 2021-10-23 DIAGNOSIS — M67449 Ganglion, unspecified hand: Secondary | ICD-10-CM

## 2021-10-23 NOTE — Progress Notes (Signed)
Patient is a 70 year old female here for follow-up after excision of right long finger DIP ganglion cyst with Dr. Arita Miss on 09/16/2021.  She is 5 weeks postop.  She is doing well.  She reports a little bit of tenderness with in the right long finger when writing with a pen or pencil.  Otherwise she is doing well.  She feels as if stiffness and range of motion is continuing to improve.  She is not having any infectious symptoms.  On exam right long finger incision is intact and healing well.  There is a little bit of redness, but this appears more irritative than infected.  There is no cellulitic changes noted.  There is minimal swelling noted.  Range of motion is overall normal at this point.  Good capillary refill noted.  Good sensation intact distally.  She has a palpable radial pulse.  Discussed with patient that range of motion will continue to improve with continued use.  Recommend calling with questions or concerns.  Recommend following up as needed.

## 2021-11-21 ENCOUNTER — Ambulatory Visit
Admission: RE | Admit: 2021-11-21 | Discharge: 2021-11-21 | Disposition: A | Discharge status: Home / Self Care | Payer: Medicare Other | Source: Ambulatory Visit | Attending: Family Medicine | Admitting: Family Medicine

## 2021-11-21 ENCOUNTER — Other Ambulatory Visit: Payer: Self-pay | Admitting: Family Medicine

## 2021-11-21 ENCOUNTER — Other Ambulatory Visit: Payer: Self-pay

## 2021-11-21 DIAGNOSIS — R059 Cough, unspecified: Secondary | ICD-10-CM

## 2023-05-03 ENCOUNTER — Ambulatory Visit: Payer: Medicare Other | Admitting: Neurology

## 2023-05-03 ENCOUNTER — Encounter: Payer: Self-pay | Admitting: Neurology

## 2023-05-03 VITALS — BP 115/61 | HR 74 | Ht 63.0 in | Wt 180.5 lb

## 2023-05-03 DIAGNOSIS — R519 Headache, unspecified: Secondary | ICD-10-CM

## 2023-05-03 NOTE — Progress Notes (Signed)
Chief Complaint  Patient presents with   Follow-up    Rm 15, alone Headaches: has them daily/sharp neck turning causes unearable pain      ASSESSMENT AND PLAN  Alexis Frye is a 72 y.o. 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   MEDICAL HISTORY:  Alexis Frye is a 72 year old female, seen in request by her primary care from [facility] Dr. Tenny Craw, Darlen Round, for evaluation left-sided neck pain,    I reviewed and summarized the referring note. PMHX. GERD HTN HLD  She was seen by stroke service in the past, presented on August 15, 2020 with sudden onset of left facial and hand paresthesia, left leg weakness, MRI of the brain showed no acute abnormality, was given the diagnosis of TIA, vascular risk factor of hypertension, aging, hyperlipidemia, MRA and CT angiogram of head and neck of the brain showed no large vessel disease, normal echocardiogram, A1c 5.4, she was treated with Plavix 75 mg  Today she came in with main complaints of left neck pain, this been ongoing since 2000, was given the diagnosis of left occipital neuralgia, was treated at [facility] clinic initially, only very temporary relief with nerve block, later had spinal stimulator placement, was not sure about the benefit, eventually was taken out when she underwent send left shoulder surgery in 2016  Now she has intermittent radiating pain from left nuchal line to left occipital parietal region, often triggered by certain head position, turning her neck to the extreme left side with triggered events  She denies radiating pain to her shoulder no gait abnormality, only occasionally right side  involvement    PHYSICAL EXAM:   Vitals:   05/03/23 1442  BP: 115/61  Pulse: 74  Weight: 180 lb 8 oz (81.9 kg)  Height: 5\' 3"  (1.6 m)      Body mass index is 31.97 kg/m.  PHYSICAL EXAMNIATION:  Gen: NAD, conversant, well nourised, well groomed                     Cardiovascular: Regular rate rhythm, no peripheral edema, warm, nontender. Eyes: Conjunctivae clear without exudates or hemorrhage Neck: Supple, no carotid bruits. Pulmonary: Clear to auscultation bilaterally   NEUROLOGICAL EXAM:  MENTAL STATUS: Speech/cognition: Awake, alert, oriented to history taking and casual conversation CRANIAL NERVES: CN II: Visual fields are full to confrontation. Pupils are round equal and briskly reactive to light. CN III, IV, VI: extraocular movement are normal. No ptosis. CN V: Facial sensation is intact to light touch CN VII: Face is symmetric with normal eye closure  CN VIII: Hearing is normal to causal conversation. CN IX, X: Phonation is normal. CN XI: Head turning and shoulder shrug are intact  MOTOR: There is no pronator drift of out-stretched arms. Muscle bulk and tone are normal. Muscle strength is normal.  Significant tenderness of left nuchal line  REFLEXES: Reflexes are 2+ and symmetric at the biceps, triceps, knees, and ankles. Plantar responses are flexor.  SENSORY: Intact to light touch, pinprick and vibratory sensation are intact in fingers and toes.  COORDINATION: There is no trunk or limb dysmetria noted.  GAIT/STANCE: Posture is normal. Gait is steady    REVIEW OF SYSTEMS:  Full  14 system review of systems performed and notable only for as above All other review of systems were negative.   ALLERGIES: Allergies  Allergen Reactions   Codeine Other (See Comments)    unk reaction.  Other Reaction(s): loopy feeling    HOME MEDICATIONS: Current Outpatient Medications  Medication Sig Dispense Refill   amLODipine (NORVASC) 5 MG tablet Take 5 mg by  mouth daily.   1   aspirin EC 81 MG tablet Take 81-162 mg by mouth See admin instructions. Take 81 mg in the morning and 162 mg at bedtime     atorvastatin (LIPITOR) 40 MG tablet Take 1 tablet (40 mg total) by mouth daily. (Patient taking differently: Take 20 mg by mouth at bedtime.) 30 tablet 0   CALCIUM GLUCONATE PO Take 600 mg by mouth daily.     Cholecalciferol (VITAMIN D) 125 MCG (5000 UT) CAPS Take 15,000 Units by mouth daily.     Cyanocobalamin (VITAMIN B 12) 500 MCG TABS Take 5 tablets by mouth daily.     gabapentin (NEURONTIN) 300 MG capsule Take 300-600 mg by mouth See admin instructions. Take 300 mg in the morning and 600 mg at bedtime     Homeopathic Products [facility] ALLERGY EYE RELIEF) SOLN Place 1 drop into both eyes daily.     loratadine (CLARITIN) 10 MG tablet Take 10 mg by mouth daily.     Magnesium 250 MG TABS Take 1 tablet by mouth daily.     Omega-3 Fatty Acids (FISH OIL) 1000 MG CAPS Take 1 capsule by mouth daily.     omeprazole (PRILOSEC) 20 MG capsule Take 20 mg by mouth every other day.     ondansetron (ZOFRAN) 4 MG tablet Take 1 tablet (4 mg total) by mouth every 8 (eight) hours as needed for nausea or vomiting. 20 tablet 0   Turmeric 500 MG CAPS Take 1 tablet by mouth daily after breakfast.     No current facility-administered medications for this visit.    PAST MEDICAL HISTORY: Past Medical History:  Diagnosis Date   Arm pain left   Arthritis    GERD (gastroesophageal reflux disease)    Headache    Hyperlipemia    Hypertension    PONV (postoperative nausea and vomiting)    Seasonal allergies    Stroke (HCC)    2 TIAS 2012 and 2021   TIA (transient ischemic attack) 12/14/2010   no residual   TIA (transient ischemic attack) 2021   Wears glasses     PAST SURGICAL HISTORY: Past Surgical History:  Procedure Laterality Date   ABDOMINAL HYSTERECTOMY     CARDIAC CATHETERIZATION N/A 04/01/2016   Procedure: Left Heart Cath and Coronary Angiography;   Surgeon: Peter M Swaziland, MD;  Location: [facility];  Service: Cardiovascular;  Laterality: N/A;   CATARACT EXTRACTION Bilateral 2020   CESAREAN SECTION     x2   CHOLECYSTECTOMY     COLONOSCOPY     CYST EXCISION Right 09/16/2021   Procedure: Excision of right long finger DIP ganglion cyst;  Surgeon: Allena Napoleon, MD;  Location: [facility];  Service: Plastics;  Laterality: Right;   DIAGNOSTIC LAPAROSCOPY     DILATION AND CURETTAGE OF UTERUS     LARYNGOSCOPY     vocal cord polyps   PANNICULECTOMY N/A 06/09/2021   Procedure: Infraumbilical panniculectomy;  Surgeon: Allena Napoleon, MD;  Location: [facility];  Service: Plastics;  Laterality: N/A;  90 min   SHOULDER ARTHROSCOPY WITH  ROTATOR CUFF REPAIR Left 11/26/2014   Procedure: LEFT SHOULDER ARTHROSCOPY W/ SAD/DCR (no cuff repair);  Surgeon: Nestor Lewandowsky, MD;  Location: [facility];  Service: Orthopedics;  Laterality: Left;   TONSILLECTOMY      FAMILY HISTORY: Family History  Problem Relation Age of Onset   Emphysema Mother    Heart disease Mother    Osteoporosis Mother    Alcoholism Father    Heart attack Sister 78       smoker   Heart attack Sister 31       smoker    SOCIAL HISTORY: Social History   Socioeconomic History   Marital status: Married    Spouse name: Not on file   Number of children: 2   Years of education: 16   Highest education level: Not on file  Occupational History   Occupation: Copywriter, advertising of [employer]    Comment: Doctor, general practice  Tobacco Use   Smoking status: Never   Smokeless tobacco: Never  Vaping Use   Vaping Use: Never used  Substance and Sexual Activity   Alcohol use: Yes    Alcohol/week: 1.0 standard drink of alcohol    Types: 1 Glasses of wine per week    Comment: Occasional wine   Drug use: No   Sexual activity: Not Currently  Other Topics Concern   Not on file  Social History Narrative   Lives at home with her husband and mother-in-law.    Right-handed.   2 cups caffeine per day.   Social Determinants of Health   Financial Resource Strain: Not on file  Food Insecurity: Not on file  Transportation Needs: Not on file  Physical Activity: Not on file  Stress: Not on file  Social Connections: Not on file  Intimate Partner Violence: Not on file      Levert Feinstein, M.D. Ph.D.  [facility] [address], [address] [place], [place] [address] Ph: [phone] Fax: [phone]  CC:  Daisy Floro, MD [address] [place],  [place] [address]  Daisy Floro, MD

## 2023-05-04 ENCOUNTER — Telehealth: Payer: Self-pay | Admitting: Neurology

## 2023-05-04 NOTE — Telephone Encounter (Signed)
Referral sent to [facility] [phone] 

## 2023-05-05 ENCOUNTER — Ambulatory Visit: Payer: Medicare Other | Attending: Neurology | Admitting: Physical Therapy

## 2023-05-05 DIAGNOSIS — R293 Abnormal posture: Secondary | ICD-10-CM | POA: Insufficient documentation

## 2023-05-05 DIAGNOSIS — R519 Headache, unspecified: Secondary | ICD-10-CM | POA: Diagnosis not present

## 2023-05-05 DIAGNOSIS — M5481 Occipital neuralgia: Secondary | ICD-10-CM | POA: Diagnosis present

## 2023-05-05 NOTE — Therapy (Signed)
OUTPATIENT PHYSICAL THERAPY CERVICAL EVALUATION   Patient Name: Alexis Frye MRN: [id number] DOB:09-30-1951, 72 y.o., female Today's Date: 05/05/2023  END OF SESSION:  PT End of Session - 05/05/23 1234     Visit Number 1    Number of Visits 13   plus eval   Date for PT Re-Evaluation 07/07/23    Authorization Type [employer] Medicare    Progress Note Due on Visit 10    PT Start Time 1233    PT Stop Time 1312    PT Time Calculation (min) 39 min    Activity Tolerance Patient tolerated treatment well    Behavior During Therapy WFL for tasks assessed/performed             Past Medical History:  Diagnosis Date   Arm pain left   Arthritis    GERD (gastroesophageal reflux disease)    Headache    Hyperlipemia    Hypertension    PONV (postoperative nausea and vomiting)    Seasonal allergies    Stroke (HCC)    2 TIAS 2012 and 2021   TIA (transient ischemic attack) 12/14/2010   no residual   TIA (transient ischemic attack) 2021   Wears glasses    Past Surgical History:  Procedure Laterality Date   ABDOMINAL HYSTERECTOMY     CARDIAC CATHETERIZATION N/A 04/01/2016   Procedure: Left Heart Cath and Coronary Angiography;  Surgeon: Peter M Swaziland, MD;  Location: [facility];  Service: Cardiovascular;  Laterality: N/A;   CATARACT EXTRACTION Bilateral 2020   CESAREAN SECTION     x2   CHOLECYSTECTOMY     COLONOSCOPY     CYST EXCISION Right 09/16/2021   Procedure: Excision of right long finger DIP ganglion cyst;  Surgeon: Allena Napoleon, MD;  Location: [facility];  Service: Plastics;  Laterality: Right;   DIAGNOSTIC LAPAROSCOPY     DILATION AND CURETTAGE OF UTERUS     LARYNGOSCOPY     vocal cord polyps   PANNICULECTOMY N/A 06/09/2021   Procedure: Infraumbilical panniculectomy;  Surgeon: Allena Napoleon, MD;  Location: [facility];  Service: Plastics;  Laterality: N/A;  90 min   SHOULDER ARTHROSCOPY WITH ROTATOR CUFF REPAIR Left 11/26/2014   Procedure: LEFT SHOULDER  ARTHROSCOPY W/ SAD/DCR (no cuff repair);  Surgeon: Nestor Lewandowsky, MD;  Location: [facility];  Service: Orthopedics;  Laterality: Left;   TONSILLECTOMY     Patient Active Problem List   Diagnosis Date Noted   Transient ischemic attack (TIA) 08/15/2020   Cervicogenic headache 04/14/2017   Sleep apnea 10/15/2016   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   Essential hypertension    Pain in the chest    Chest pain 03/31/2016   White coat hypertension 03/31/2016   HLD (hyperlipidemia) 03/31/2016   Musculoskeletal pain 03/31/2016   GERD (gastroesophageal reflux disease) 03/31/2016   Left-sided headache 10/29/2015    PCP: Daisy Floro, MD   REFERRING PROVIDER: Levert Feinstein, MD  REFERRING DIAG: R51.9 (ICD-10-CM) - Left-sided headache   THERAPY DIAG:  Occipital neuralgia of left side  Abnormal posture  Rationale for Evaluation and Treatment: Rehabilitation  ONSET DATE: 05/03/2023  SUBJECTIVE:  SUBJECTIVE STATEMENT: Pt reports she has had a constant headache for 42 years. Turning her head to the left side triggers her pain. Feels constant pressure on the left side of her head. Used to have a spinal stimulator to assist with pain, but had it removed when she had a bone spur removed from her shoulder in 2018.   Hand dominance: Right  PERTINENT HISTORY:  She was seen by stroke service in the past, presented on August 15, 2020 with sudden onset of left facial and hand paresthesia, left leg weakness, MRI of the brain showed no acute abnormality, was given the diagnosis of TIA, vascular risk factor of hypertension, aging, hyperlipidemia  PAIN:  Are you having pain? No Pt states she feels constant pressure in her sub-occipital region on the left side   PRECAUTIONS: None  WEIGHT  BEARING RESTRICTIONS: No  FALLS:  Has patient fallen in last 6 months? No  LIVING ENVIRONMENT: Lives with: lives with their family and lives with their spouse Lives in: House/apartment Stairs: Yes: External: 1-2 steps; none Has following equipment at home: None  OCCUPATION: Retired from [employer]   PLOF: Independent  PATIENT GOALS: "I'd like to find out what can relieve the pressure in my head"   NEXT MD VISIT: Has referral to [facility] - no appt date yet  OBJECTIVE:   DIAGNOSTIC FINDINGS:  No relevant imaging on file   PATIENT SURVEYS:  NDI 15/50 (moderate disability)  COGNITION: Overall cognitive status: Within functional limits for tasks assessed  SENSATION: Pt denies numbness/tingling   POSTURE: rounded shoulders, forward head, and head cocked to L   PALPATION: TTP of L upper trap and suboccipitals    CERVICAL ROM: Tested in seated position   Active ROM A/PROM (deg) eval  Flexion 32  Extension 25  Right lateral flexion 15  Left lateral flexion 22  Right rotation 38  Left rotation 34   (Blank rows = not tested)    TODAY'S TREATMENT:                Next Session                                                                                                                  PATIENT EDUCATION:  Education details: POC, eval findings, information on 4" Chirp Wheel for headache relief and on a massage therapist.  Person educated: Patient Education method: Chief Technology Officer Education comprehension: verbalized understanding  HOME EXERCISE PROGRAM: To be established   ASSESSMENT:  CLINICAL IMPRESSION: Patient is a 72 year old female referred to Neuro OPPT for L sided headaches. Pt's PMH is significant for: GERD, HTN and HLD. The following deficits were present during the exam: decreased cervical ROM, decreased spinal mobility and pain. Educated pt on purchasing massage wheel for headache relief and information on a massage  therapist. Pt would benefit from skilled PT to address these impairments and functional limitations to maximize functional mobility independence.    OBJECTIVE IMPAIRMENTS: decreased mobility, decreased ROM, increased  muscle spasms, improper body mechanics, and pain  ACTIVITY LIMITATIONS: bending  PARTICIPATION LIMITATIONS: driving  PERSONAL FACTORS: Age, Fitness, Past/current experiences, and 1 comorbidity: HTN  are also affecting patient's functional outcome.   REHAB POTENTIAL: Good  CLINICAL DECISION MAKING: Stable/uncomplicated  EVALUATION COMPLEXITY: Low   GOALS: Goals reviewed with patient? Yes  SHORT TERM GOALS: Target date: 06/02/2023  Pt will be independent with initial HEP for improved strength, ROM and functional use of cervical spine.  Baseline:  Goal status: INITIAL  2.  Pt will improve R cervical lateral flexion by 10 degrees for improved cervical functional mobility and reduced pain  Baseline: 15 degrees  Goal status: INITIAL   LONG TERM GOALS: Target date: 06/30/2023    Pt will be independent with final HEP for improved strength, ROM and functional use of cervical spine Baseline:  Goal status: INITIAL  2.  Pt will improve NDI to </= 8/50 for reduced pain levels and improved activity tolerance  Baseline: 15/50 Goal status: INITIAL  3.  Pt will improved left and right cervical rotation by 10 degrees each for improved safety w/driving  Baseline: Right: 38 degrees; left: 34 degrees  Goal status: INITIAL  4.  Pt will improve cervical extension by 10 degrees for improved ROM  Baseline: 25 degrees  Goal status: INITIAL    PLAN:  PT FREQUENCY:  2x/week for 4 weeks and 1x/week for 4 weeks  PT DURATION: 8 weeks  PLANNED INTERVENTIONS: Therapeutic exercises, Therapeutic activity, Neuromuscular re-education, Balance training, Gait training, Patient/Family education, Self Care, Joint mobilization, Joint manipulation, Aquatic Therapy, Dry Needling,  Electrical stimulation, Spinal mobilization, Moist heat, Manual therapy, and Re-evaluation  PLAN FOR NEXT SESSION: Dry needling, HEP for improved ROM and periscapular strength, manual suboccipital release and upper trap stretch    Mara Favero E Timoteo Carreiro, PT, DPT  05/05/2023, 1:20 PM

## 2023-05-11 ENCOUNTER — Ambulatory Visit: Payer: Medicare Other | Admitting: Physical Therapy

## 2023-05-11 DIAGNOSIS — M5481 Occipital neuralgia: Secondary | ICD-10-CM

## 2023-05-11 DIAGNOSIS — R293 Abnormal posture: Secondary | ICD-10-CM

## 2023-05-11 NOTE — Therapy (Signed)
OUTPATIENT PHYSICAL THERAPY CERVICAL TREATMENT   Patient Name: Alexis Frye MRN: [id number] DOB:1951-05-06, 72 y.o., female Today's Date: 05/11/2023  END OF SESSION:  PT End of Session - 05/11/23 0802     Visit Number 2    Number of Visits 13   plus eval   Date for PT Re-Evaluation 07/07/23    Authorization Type U[employer] Medicare    Progress Note Due on Visit 10    PT Start Time 0802    PT Stop Time 0841    PT Time Calculation (min) 39 min    Activity Tolerance Patient tolerated treatment well    Behavior During Therapy [facility] for tasks assessed/performed              Past Medical History:  Diagnosis Date   Arm pain left   Arthritis    GERD (gastroesophageal reflux disease)    Headache    Hyperlipemia    Hypertension    PONV (postoperative nausea and vomiting)    Seasonal allergies    Stroke (HCC)    2 TIAS 2012 and 2021   TIA (transient ischemic attack) 12/14/2010   no residual   TIA (transient ischemic attack) 2021   Wears glasses    Past Surgical History:  Procedure Laterality Date   ABDOMINAL HYSTERECTOMY     CARDIAC CATHETERIZATION N/A 04/01/2016   Procedure: Left Heart Cath and Coronary Angiography;  Surgeon: Peter M Swaziland, MD;  Location: [facility];  Service: Cardiovascular;  Laterality: N/A;   CATARACT EXTRACTION Bilateral 2020   CESAREAN SECTION     x2   CHOLECYSTECTOMY     COLONOSCOPY     CYST EXCISION Right 09/16/2021   Procedure: Excision of right long finger DIP ganglion cyst;  Surgeon: Allena Napoleon, MD;  Location: [facility];  Service: Plastics;  Laterality: Right;   DIAGNOSTIC LAPAROSCOPY     DILATION AND CURETTAGE OF UTERUS     LARYNGOSCOPY     vocal cord polyps   PANNICULECTOMY N/A 06/09/2021   Procedure: Infraumbilical panniculectomy;  Surgeon: Allena Napoleon, MD;  Location: [facility];  Service: Plastics;  Laterality: N/A;  90 min   SHOULDER ARTHROSCOPY WITH ROTATOR CUFF REPAIR Left 11/26/2014   Procedure: LEFT  SHOULDER ARTHROSCOPY W/ SAD/DCR (no cuff repair);  Surgeon: Nestor Lewandowsky, MD;  Location: [facility];  Service: Orthopedics;  Laterality: Left;   TONSILLECTOMY     Patient Active Problem List   Diagnosis Date Noted   Transient ischemic attack (TIA) 08/15/2020   Cervicogenic headache 04/14/2017   Sleep apnea 10/15/2016   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   Essential hypertension    Pain in the chest    Chest pain 03/31/2016   White coat hypertension 03/31/2016   HLD (hyperlipidemia) 03/31/2016   Musculoskeletal pain 03/31/2016   GERD (gastroesophageal reflux disease) 03/31/2016   Left-sided headache 10/29/2015    PCP: Daisy Floro, MD   REFERRING PROVIDER: Levert Feinstein, MD  REFERRING DIAG: R51.9 (ICD-10-CM) - Left-sided headache   THERAPY DIAG:  Occipital neuralgia of left side  Abnormal posture  Rationale for Evaluation and Treatment: Rehabilitation  ONSET DATE: 05/03/2023  SUBJECTIVE:  SUBJECTIVE STATEMENT: Pt reports she feels like her pain is doing better, has been using heat, working on neck stretches, and got a massage last Friday and that really helped. Pt does have another massage this Friday then plans to reduce frequency of massages due to cost.  Hand dominance: Right  PERTINENT HISTORY:  She was seen by stroke service in the past, presented on August 15, 2020 with sudden onset of left facial and hand paresthesia, left leg weakness, MRI of the brain showed no acute abnormality, was given the diagnosis of TIA, vascular risk factor of hypertension, aging, hyperlipidemia  PAIN:  Are you having pain? No Pt states she feels constant pressure in her sub-occipital region on the left side   PRECAUTIONS: None  WEIGHT BEARING RESTRICTIONS: No  FALLS:  Has  patient fallen in last 6 months? No  LIVING ENVIRONMENT: Lives with: lives with their family and lives with their spouse Lives in: House/apartment Stairs: Yes: External: 1-2 steps; none Has following equipment at home: None  OCCUPATION: Retired from [employer]   PLOF: Independent  PATIENT GOALS: "I'd like to find out what can relieve the pressure in my head"   NEXT MD VISIT: Has referral to [facility] - no appt date yet  OBJECTIVE:   DIAGNOSTIC FINDINGS:  No relevant imaging on file   PATIENT SURVEYS:  NDI 15/50 (moderate disability)  COGNITION: Overall cognitive status: Within functional limits for tasks assessed  SENSATION: Pt denies numbness/tingling   POSTURE: rounded shoulders, forward head, and head cocked to L   PALPATION: TTP of L upper trap and suboccipitals    CERVICAL ROM: Tested in seated position   Active ROM A/PROM (deg) eval  Flexion 32  Extension 25  Right lateral flexion 15  Left lateral flexion 22  Right rotation 38  Left rotation 34   (Blank rows = not tested)    TODAY'S TREATMENT:                TherAct Trigger Point Dry-Needling  Treatment instructions: Expect mild to moderate muscle soreness. S/S of pneumothorax if dry needled over a lung field, and to seek immediate medical attention should they occur. Patient verbalized understanding of these instructions and education.  Patient Consent Given: Yes Education handout provided: Yes Muscles treated: L and R suboccipitals, L and R splenius capitus, L and R upper trap Treatment response/outcome: deep ache/muscle cramp; twitch detected in UT   Trigger Point Dry Needling  What is Trigger Point Dry Needling (DN)? DN is a physical therapy technique used to treat muscle pain and dysfunction. Specifically, DN helps deactivate muscle trigger points (muscle knots).  A thin filiform needle is used to penetrate the skin and stimulate the underlying trigger point. The goal is for  a local twitch response (LTR) to occur and for the trigger point to relax. No medication of any kind is injected during the procedure.   What Does Trigger Point Dry Needling Feel Like?  The procedure feels different for each individual patient. Some patients report that they do not actually feel the needle enter the skin and overall the process is not painful. Very mild bleeding may occur. However, many patients feel a deep cramping in the muscle in which the needle was inserted. This is the local twitch response.   How Will I feel after the treatment? Soreness is normal, and the onset of soreness may not occur for a few hours. Typically this soreness does not last longer than two days.  Bruising  is uncommon, however; ice can be used to decrease any possible bruising.  In rare cases feeling tired or nauseous after the treatment is normal. In addition, your symptoms may get worse before they get better, this period will typically not last longer than 24 hours.   What Can I do After My Treatment? Increase your hydration by drinking more water for the next 24 hours. You may place ice or heat on the areas treated that have become sore, however, do not use heat on inflamed or bruised areas. Heat often brings more relief post needling. You can continue your regular activities, but vigorous activity is not recommended initially after the treatment for 24 hours. DN is best combined with other physical therapy such as strengthening, stretching, and other therapies.    TherEx Reviewed levator scap stretch and upper trap stretch Seated assisted cervical rotation with towel 3 x 30 sec each B  Added to HEP, see bolded below  Manual Therapy Suboccipital release 5 x 45 sec each  Discussed use of tennis balls vs 4" chirp wheel and use of pillow under top of head to decrease intensity of chirp wheel. Provided handout for suboccipital release with tennis balls and encouraged pt to bring in her chirp wheel to  next session to trial.                                                                                                                  PATIENT EDUCATION:  Education details: TPDN (see above), added to HEP Person educated: Patient Education method: Explanation and Handouts Education comprehension: verbalized understanding  HOME EXERCISE PROGRAM: Access Code: [email or web address] URL: [email or web address] Date: 05/11/2023 Prepared by: Peter Congo  Exercises - Seated Upper Trapezius Stretch  - 1 x daily - 7 x weekly - 1 sets - 5 reps - 30 sec hold - Gentle Levator Scapulae Stretch  - 1 x daily - 7 x weekly - 1 sets - 5 reps - 30 sec hold - Seated Assisted Cervical Rotation with Towel  - 1 x daily - 7 x weekly - 1 sets - 5 reps - 30 sec hold - Supine Suboccipital Release with Tennis Balls  - 1 x daily - 7 x weekly - 1 sets - 1 reps - 5-10 min hold   ASSESSMENT:  CLINICAL IMPRESSION: Emphasis of skilled PT session on initiating TPDN, performing manual therapy, and adding to HEP to address ongoing pain and decreased cervical ROM. Pt with good response to TPDN, will further assess next session to determine if it was effective. Pt with some difficulty using her chirp wheel so far due to intensity of pressure, discussed ways to modify its use and/or use tennis balls instead. Pt to bring her chirp wheel to next session to assess. Pt continues to benefit from skilled therapy services to address ongoing pain and tightness and work towards increasing independence with symptom management. Continue POC.    OBJECTIVE IMPAIRMENTS: decreased mobility, decreased ROM, increased muscle spasms, improper body mechanics, and pain  ACTIVITY LIMITATIONS:  bending  PARTICIPATION LIMITATIONS: driving  PERSONAL FACTORS: Age, Fitness, Past/current experiences, and 1 comorbidity: HTN  are also affecting patient's functional outcome.   REHAB POTENTIAL: Good  CLINICAL DECISION MAKING:  Stable/uncomplicated  EVALUATION COMPLEXITY: Low   GOALS: Goals reviewed with patient? Yes  SHORT TERM GOALS: Target date: 06/02/2023  Pt will be independent with initial HEP for improved strength, ROM and functional use of cervical spine.  Baseline:  Goal status: INITIAL  2.  Pt will improve R cervical lateral flexion by 10 degrees for improved cervical functional mobility and reduced pain  Baseline: 15 degrees  Goal status: INITIAL   LONG TERM GOALS: Target date: 06/30/2023    Pt will be independent with final HEP for improved strength, ROM and functional use of cervical spine Baseline:  Goal status: INITIAL  2.  Pt will improve NDI to </= 8/50 for reduced pain levels and improved activity tolerance  Baseline: 15/50 Goal status: INITIAL  3.  Pt will improved left and right cervical rotation by 10 degrees each for improved safety w/driving  Baseline: Right: 38 degrees; left: 34 degrees  Goal status: INITIAL  4.  Pt will improve cervical extension by 10 degrees for improved ROM  Baseline: 25 degrees  Goal status: INITIAL    PLAN:  PT FREQUENCY:  2x/week for 4 weeks and 1x/week for 4 weeks  PT DURATION: 8 weeks  PLANNED INTERVENTIONS: Therapeutic exercises, Therapeutic activity, Neuromuscular re-education, Balance training, Gait training, Patient/Family education, Self Care, Joint mobilization, Joint manipulation, Aquatic Therapy, Dry Needling, Electrical stimulation, Spinal mobilization, Moist heat, Manual therapy, and Re-evaluation  PLAN FOR NEXT SESSION: Dry needling, HEP for improved ROM and periscapular strength, did pt bring chirp wheel?   Peter Congo, PT, DPT, CSRS  05/11/2023, 8:42 AM

## 2023-05-14 ENCOUNTER — Encounter: Payer: Self-pay | Admitting: Physical Therapy

## 2023-05-14 ENCOUNTER — Ambulatory Visit: Payer: Medicare Other | Admitting: Physical Therapy

## 2023-05-14 NOTE — Therapy (Signed)
[facility] [address] [place], [place], [address] Phone: [phone]   Fax:  [phone]  Patient Details  Name: Alexis Frye MRN: [id number] Date of Birth: 05-14-1951 Referring Provider:  No ref. provider found  Encounter Date: 05/14/2023  Called and spoke to pt regarding not showing to today's scheduled PT appointment. Pt reported she was unable to make today's appointment due to personal reasons. Reminded pt of next scheduled appointment and pt verbalized understanding.    Jill Alexanders Granite Godman, PT, DPT 05/14/2023, 8:18 AM  [facility] [address] [place], [place], [address] Phone: [phone]   Fax:  [phone]

## 2023-05-17 ENCOUNTER — Ambulatory Visit: Payer: Medicare Other | Attending: Neurology | Admitting: Physical Therapy

## 2023-05-17 DIAGNOSIS — R293 Abnormal posture: Secondary | ICD-10-CM | POA: Insufficient documentation

## 2023-05-17 DIAGNOSIS — M5481 Occipital neuralgia: Secondary | ICD-10-CM | POA: Diagnosis present

## 2023-05-17 NOTE — Therapy (Signed)
OUTPATIENT PHYSICAL THERAPY CERVICAL TREATMENT   Patient Name: Alexis Frye MRN: [id number] DOB:June 21, 1951, 72 y.o., female Today's Date: 05/17/2023  END OF SESSION:  PT End of Session - 05/17/23 1020     Visit Number 3    Number of Visits 13   plus eval   Date for PT Re-Evaluation 07/07/23    Authorization Type UHC Medicare    Progress Note Due on Visit 10    PT Start Time 1017    PT Stop Time 1100    PT Time Calculation (min) 43 min    Activity Tolerance Patient tolerated treatment well    Behavior During Therapy WFL for tasks assessed/performed               Past Medical History:  Diagnosis Date   Arm pain left   Arthritis    GERD (gastroesophageal reflux disease)    Headache    Hyperlipemia    Hypertension    PONV (postoperative nausea and vomiting)    Seasonal allergies    Stroke (HCC)    2 TIAS 2012 and 2021   TIA (transient ischemic attack) 12/14/2010   no residual   TIA (transient ischemic attack) 2021   Wears glasses    Past Surgical History:  Procedure Laterality Date   ABDOMINAL HYSTERECTOMY     CARDIAC CATHETERIZATION N/A 04/01/2016   Procedure: Left Heart Cath and Coronary Angiography;  Surgeon: Peter M Swaziland, MD;  Location: [facility];  Service: Cardiovascular;  Laterality: N/A;   CATARACT EXTRACTION Bilateral 2020   CESAREAN SECTION     x2   CHOLECYSTECTOMY     COLONOSCOPY     CYST EXCISION Right 09/16/2021   Procedure: Excision of right long finger DIP ganglion cyst;  Surgeon: Allena Napoleon, MD;  Location: [facility];  Service: Plastics;  Laterality: Right;   DIAGNOSTIC LAPAROSCOPY     DILATION AND CURETTAGE OF UTERUS     LARYNGOSCOPY     vocal cord polyps   PANNICULECTOMY N/A 06/09/2021   Procedure: Infraumbilical panniculectomy;  Surgeon: Allena Napoleon, MD;  Location: [facility];  Service: Plastics;  Laterality: N/A;  90 min   SHOULDER ARTHROSCOPY WITH ROTATOR CUFF REPAIR Left 11/26/2014   Procedure: LEFT  SHOULDER ARTHROSCOPY W/ SAD/DCR (no cuff repair);  Surgeon: Nestor Lewandowsky, MD;  Location: [facility];  Service: Orthopedics;  Laterality: Left;   TONSILLECTOMY     Patient Active Problem List   Diagnosis Date Noted   Transient ischemic attack (TIA) 08/15/2020   Cervicogenic headache 04/14/2017   Sleep apnea 10/15/2016   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   Essential hypertension    Pain in the chest    Chest pain 03/31/2016   White coat hypertension 03/31/2016   HLD (hyperlipidemia) 03/31/2016   Musculoskeletal pain 03/31/2016   GERD (gastroesophageal reflux disease) 03/31/2016   Left-sided headache 10/29/2015    PCP: Daisy Floro, MD   REFERRING PROVIDER: Levert Feinstein, MD  REFERRING DIAG: R51.9 (ICD-10-CM) - Left-sided headache   THERAPY DIAG:  Abnormal posture  Occipital neuralgia of left side  Rationale for Evaluation and Treatment: Rehabilitation  ONSET DATE: 05/03/2023  SUBJECTIVE:  SUBJECTIVE STATEMENT: Pt reports she feels like her pain is doing better, has been doing her exercises and getting massages. Does not like her Chirp wheel but did get a [employer] vibrating ball that she has been using and that helps. No falls   Hand dominance: Right  PERTINENT HISTORY:  She was seen by stroke service in the past, presented on August 15, 2020 with sudden onset of left facial and hand paresthesia, left leg weakness, MRI of the brain showed no acute abnormality, was given the diagnosis of TIA, vascular risk factor of hypertension, aging, hyperlipidemia  PAIN:  Are you having pain? Yes: NPRS scale: 4/10 Pain location: L cervical spine Pain description: Achy/shooting Pt states she feels constant pressure in her sub-occipital region on the left side   PRECAUTIONS:  None  WEIGHT BEARING RESTRICTIONS: No  FALLS:  Has patient fallen in last 6 months? No  LIVING ENVIRONMENT: Lives with: lives with their family and lives with their spouse Lives in: House/apartment Stairs: Yes: External: 1-2 steps; none Has following equipment at home: None  OCCUPATION: Retired from [employer]   PLOF: Independent  PATIENT GOALS: "I'd like to find out what can relieve the pressure in my head"   NEXT MD VISIT: Has referral to [facility] - no appt date yet  OBJECTIVE:   DIAGNOSTIC FINDINGS:  No relevant imaging on file   PATIENT SURVEYS:  NDI 15/50 (moderate disability)  COGNITION: Overall cognitive status: Within functional limits for tasks assessed  SENSATION: Pt denies numbness/tingling   POSTURE: rounded shoulders, forward head, and head cocked to L   PALPATION: TTP of L upper trap and suboccipitals    CERVICAL ROM: Tested in seated position   Active ROM A/PROM (deg) eval  Flexion 32  Extension 25  Right lateral flexion 15  Left lateral flexion 22  Right rotation 38  Left rotation 34   (Blank rows = not tested)    TODAY'S TREATMENT:                Ther Ex The following exercises were performed for improved thoracic mobility, periscapular strength, paraspinal strength and shoulder ROM:  Quadruped thread the needles, x10 per side. Increased difficulty performing on L side > R side. Added to HEP (see bolded below) as pt enjoyed movement. Provided in quadruped and modified plantigrade position as pt unsure she likes quadruped position. Min cues to maintain gaze on moving hand for increased cervical ROM. Cat cows, x10 reps w/2s isometric hold at each position  Sidelying open books, x10 per side. Decreased ROM of L side > R side. Min cues to look at moving hand to facilitate increased cervical rotation ROM.  Sumo deadlift high pulls w/12# KB, x10 reps. Min cues initially for increased speed of movement and for scapular  retraction at top of movement.  Farmer's carries w/single 20# KB in unilateral UE, x115' each UE. No instability or leaning compensation noted, but pt reported increased difficulty when holding on L side.   Cervical and scapular retraction at wall, x8 reps w/10-15s hold. Pt reported feeling stretch along superior angle of scapulas w/this. Added to HEP (see bolded below)  Unweighted cuban presses at wall, x4 reps. Pt very challenged by this and was unable to perform due to decreased mobility of shoulders, so pt elected to discontinue movement.  PVC passthroughs and around the worlds, x8 each. Pt unable to bring PVC behind back w/passthrough but reported feeling good stretch, so added to HEP via picture on word  document, as this exercise not available on Medbridge. Pt compensated w/around the worlds by flexing elbows, so did not feel as much of a stretch and did not add to HEP.                                                                                                                  PATIENT EDUCATION:  Education details: Updates to HEP Person educated: Patient Education method: Chief Technology Officer Education comprehension: verbalized understanding  HOME EXERCISE PROGRAM: Access Code: [email or web address] URL: [email or web address] Date: 05/11/2023 Prepared by: Peter Congo  Exercises - Seated Upper Trapezius Stretch  - 1 x daily - 7 x weekly - 1 sets - 5 reps - 30 sec hold - Gentle Levator Scapulae Stretch  - 1 x daily - 7 x weekly - 1 sets - 5 reps - 30 sec hold - Seated Assisted Cervical Rotation with Towel  - 1 x daily - 7 x weekly - 1 sets - 5 reps - 30 sec hold - Supine Suboccipital Release with Tennis Balls  - 1 x daily - 7 x weekly - 1 sets - 1 reps - 5-10 min hold - Plank with Thoracic Rotation on Counter  - 1 x daily - 7 x weekly - 3 sets - 10 reps - Quadruped Full Range Thoracic Rotation with Reach  - 1 x daily - 7 x weekly - 3 sets - 10 reps - Cervical Retraction at  Wall  - 1 x daily - 7 x weekly - 3 sets - 10 reps - 5-10 second hold  Provided PVC passthrough on word document on 6/3   ASSESSMENT:  CLINICAL IMPRESSION: Emphasis of skilled PT session on cervical ROM, periscapular strength, thoracic mobility and shoulder stability. Pt tolerated session well w/no report of pain. Pt most limited by decreased thoracic mobility, scapular retraction and L shoulder flexion/ER. Pt very challenged by [place] presses and PVC passthroughs, but did improve w/practiced. Added a few mobility drills to pt's HEP to continue to work on symmetry w/movement and decreased pain. Continue POC.     OBJECTIVE IMPAIRMENTS: decreased mobility, decreased ROM, increased muscle spasms, improper body mechanics, and pain  ACTIVITY LIMITATIONS: bending  PARTICIPATION LIMITATIONS: driving  PERSONAL FACTORS: Age, Fitness, Past/current experiences, and 1 comorbidity: HTN  are also affecting patient's functional outcome.   REHAB POTENTIAL: Good  CLINICAL DECISION MAKING: Stable/uncomplicated  EVALUATION COMPLEXITY: Low   GOALS: Goals reviewed with patient? Yes  SHORT TERM GOALS: Target date: 06/02/2023  Pt will be independent with initial HEP for improved strength, ROM and functional use of cervical spine.  Baseline:  Goal status: INITIAL  2.  Pt will improve R cervical lateral flexion by 10 degrees for improved cervical functional mobility and reduced pain  Baseline: 15 degrees  Goal status: INITIAL   LONG TERM GOALS: Target date: 06/30/2023    Pt will be independent with final HEP for improved strength, ROM and functional use of cervical spine Baseline:  Goal status: INITIAL  2.  Pt will improve  NDI to </= 8/50 for reduced pain levels and improved activity tolerance  Baseline: 15/50 Goal status: INITIAL  3.  Pt will improved left and right cervical rotation by 10 degrees each for improved safety w/driving  Baseline: Right: 38 degrees; left: 34 degrees  Goal  status: INITIAL  4.  Pt will improve cervical extension by 10 degrees for improved ROM  Baseline: 25 degrees  Goal status: INITIAL    PLAN:  PT FREQUENCY:  2x/week for 4 weeks and 1x/week for 4 weeks  PT DURATION: 8 weeks  PLANNED INTERVENTIONS: Therapeutic exercises, Therapeutic activity, Neuromuscular re-education, Balance training, Gait training, Patient/Family education, Self Care, Joint mobilization, Joint manipulation, Aquatic Therapy, Dry Needling, Electrical stimulation, Spinal mobilization, Moist heat, Manual therapy, and Re-evaluation  PLAN FOR NEXT SESSION: Dry needling, HEP for improved ROM and periscapular strength, yoga moves, strength training    Keshan Reha E Tennis Mckinnon, PT, DPT  05/17/2023, 12:14 PM

## 2023-05-20 ENCOUNTER — Ambulatory Visit: Payer: Medicare Other | Admitting: Physical Therapy

## 2023-05-20 DIAGNOSIS — M5481 Occipital neuralgia: Secondary | ICD-10-CM

## 2023-05-20 DIAGNOSIS — R293 Abnormal posture: Secondary | ICD-10-CM

## 2023-05-20 NOTE — Therapy (Signed)
OUTPATIENT PHYSICAL THERAPY CERVICAL TREATMENT   Patient Name: Alexis Frye MRN: [id number] DOB:1951-01-18, 72 y.o., female Today's Date: 05/20/2023  END OF SESSION:  PT End of Session - 05/20/23 0938     Visit Number 4    Number of Visits 13   plus eval   Date for PT Re-Evaluation 07/07/23    Authorization Type [employer] Medicare    Progress Note Due on Visit 10    PT Start Time [phone]   this therapist running late   PT Stop Time 1015    PT Time Calculation (min) 39 min    Activity Tolerance Patient tolerated treatment well    Behavior During Therapy [facility] for tasks assessed/performed                Past Medical History:  Diagnosis Date   Arm pain left   Arthritis    GERD (gastroesophageal reflux disease)    Headache    Hyperlipemia    Hypertension    PONV (postoperative nausea and vomiting)    Seasonal allergies    Stroke (HCC)    2 TIAS 2012 and 2021   TIA (transient ischemic attack) 12/14/2010   no residual   TIA (transient ischemic attack) 2021   Wears glasses    Past Surgical History:  Procedure Laterality Date   ABDOMINAL HYSTERECTOMY     CARDIAC CATHETERIZATION N/A 04/01/2016   Procedure: Left Heart Cath and Coronary Angiography;  Surgeon: Peter M Swaziland, MD;  Location: [facility];  Service: Cardiovascular;  Laterality: N/A;   CATARACT EXTRACTION Bilateral 2020   CESAREAN SECTION     x2   CHOLECYSTECTOMY     COLONOSCOPY     CYST EXCISION Right 09/16/2021   Procedure: Excision of right long finger DIP ganglion cyst;  Surgeon: Allena Napoleon, MD;  Location: [facility];  Service: Plastics;  Laterality: Right;   DIAGNOSTIC LAPAROSCOPY     DILATION AND CURETTAGE OF UTERUS     LARYNGOSCOPY     vocal cord polyps   PANNICULECTOMY N/A 06/09/2021   Procedure: Infraumbilical panniculectomy;  Surgeon: Allena Napoleon, MD;  Location: [facility] OR;  Service: Plastics;  Laterality: N/A;  90 min   SHOULDER ARTHROSCOPY WITH ROTATOR CUFF REPAIR Left  11/26/2014   Procedure: LEFT SHOULDER ARTHROSCOPY W/ SAD/DCR (no cuff repair);  Surgeon: Nestor Lewandowsky, MD;  Location: [facility];  Service: Orthopedics;  Laterality: Left;   TONSILLECTOMY     Patient Active Problem List   Diagnosis Date Noted   Transient ischemic attack (TIA) 08/15/2020   Cervicogenic headache 04/14/2017   Sleep apnea 10/15/2016   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   Essential hypertension    Pain in the chest    Chest pain 03/31/2016   White coat hypertension 03/31/2016   HLD (hyperlipidemia) 03/31/2016   Musculoskeletal pain 03/31/2016   GERD (gastroesophageal reflux disease) 03/31/2016   Left-sided headache 10/29/2015    PCP: Daisy Floro, MD   REFERRING PROVIDER: Levert Feinstein, MD  REFERRING DIAG: R51.9 (ICD-10-CM) - Left-sided headache   THERAPY DIAG:  Abnormal posture  Occipital neuralgia of left side  Rationale for Evaluation and Treatment: Rehabilitation  ONSET DATE: 05/03/2023  SUBJECTIVE:  SUBJECTIVE STATEMENT: Pt reports that she has a 2/10 HA this morning, "not bad", believes it is more sinus-related and didn't have to take any pain medication for it. Pt didn't notice much of a change with dry needling, doesn't feel the need to do this treatment again.  Hand dominance: Right  PERTINENT HISTORY:  She was seen by stroke service in the past, presented on August 15, 2020 with sudden onset of left facial and hand paresthesia, left leg weakness, MRI of the brain showed no acute abnormality, was given the diagnosis of TIA, vascular risk factor of hypertension, aging, hyperlipidemia  PAIN:  Are you having pain? Yes: NPRS scale: 4/10 Pain location: L cervical spine Pain description: Achy/shooting Pt states she feels constant pressure in her  sub-occipital region on the left side   PRECAUTIONS: None  WEIGHT BEARING RESTRICTIONS: No  FALLS:  Has patient fallen in last 6 months? No  LIVING ENVIRONMENT: Lives with: lives with their family and lives with their spouse Lives in: House/apartment Stairs: Yes: External: 1-2 steps; none Has following equipment at home: None  OCCUPATION: Retired from [employer]   PLOF: Independent  PATIENT GOALS: "I'd like to find out what can relieve the pressure in my head"   NEXT MD VISIT: Has referral to [facility] - no appt date yet  OBJECTIVE:   DIAGNOSTIC FINDINGS:  No relevant imaging on file   PATIENT SURVEYS:  NDI 15/50 (moderate disability)  COGNITION: Overall cognitive status: Within functional limits for tasks assessed  SENSATION: Pt denies numbness/tingling   POSTURE: rounded shoulders, forward head, and head cocked to L   PALPATION: TTP of L upper trap and suboccipitals    CERVICAL ROM: Tested in seated position   Active ROM A/PROM (deg) eval  Flexion 32  Extension 25  Right lateral flexion 15  Left lateral flexion 22  Right rotation 38  Left rotation 34   (Blank rows = not tested)    TODAY'S TREATMENT:                Ther Ex Supine thoracic mobilization over towel roll x 15 reps Sidelying open books x 10 reps B Prone IYTs 2 x 10 reps each, no increase in pain but exercise is "difficult"   Manual Therapy: Suboccipital release 5 x 30 sec each Lateral SB with overpressure to stretch UT 3 x 45 sec each B (more tightness in LUT)                                                                                                                 PATIENT EDUCATION:  Education details: Updates to HEP, PT POC with possibility of d/c sooner pending progress Person educated: Patient Education method: Explanation and Handouts Education comprehension: verbalized understanding  HOME EXERCISE PROGRAM: Access Code: [email or web address] URL:  [email or web address] Date: 05/11/2023 Prepared by: Peter Congo  Exercises - Seated Upper Trapezius Stretch  - 1 x daily - 7 x weekly - 1 sets - 5 reps - 30 sec hold - Gentle Levator  Scapulae Stretch  - 1 x daily - 7 x weekly - 1 sets - 5 reps - 30 sec hold - Seated Assisted Cervical Rotation with Towel  - 1 x daily - 7 x weekly - 1 sets - 5 reps - 30 sec hold - Supine Suboccipital Release with Tennis Balls  - 1 x daily - 7 x weekly - 1 sets - 1 reps - 5-10 min hold - Plank with Thoracic Rotation on Counter  - 1 x daily - 7 x weekly - 3 sets - 10 reps - Quadruped Full Range Thoracic Rotation with Reach  - 1 x daily - 7 x weekly - 3 sets - 10 reps - Cervical Retraction at Wall  - 1 x daily - 7 x weekly - 3 sets - 10 reps - 5-10 second hold - Supine Thoracic Mobilization Towel Roll Vertical with Arm Stretch  - 1 x daily - 7 x weekly - 2 sets - 15 reps  Provided PVC passthrough on word document on 6/3   ASSESSMENT:  CLINICAL IMPRESSION: Emphasis of skilled PT session on performing manual therapy to address ongoing tight and painful musculature in neck region following by stretching and strengthening of postural muscles to improve postural stabilization and management of symptoms. Pt with an overall self-reported improvement in symptoms from initial evaluation. Pt continues to benefit from skilled therapy services to work towards LTGs. Continue POC.    OBJECTIVE IMPAIRMENTS: decreased mobility, decreased ROM, increased muscle spasms, improper body mechanics, and pain  ACTIVITY LIMITATIONS: bending  PARTICIPATION LIMITATIONS: driving  PERSONAL FACTORS: Age, Fitness, Past/current experiences, and 1 comorbidity: HTN  are also affecting patient's functional outcome.   REHAB POTENTIAL: Good  CLINICAL DECISION MAKING: Stable/uncomplicated  EVALUATION COMPLEXITY: Low   GOALS: Goals reviewed with patient? Yes  SHORT TERM GOALS: Target date: 06/02/2023  Pt will be  independent with initial HEP for improved strength, ROM and functional use of cervical spine.  Baseline:  Goal status: INITIAL  2.  Pt will improve R cervical lateral flexion by 10 degrees for improved cervical functional mobility and reduced pain  Baseline: 15 degrees  Goal status: INITIAL   LONG TERM GOALS: Target date: 06/30/2023   Pt will be independent with final HEP for improved strength, ROM and functional use of cervical spine Baseline:  Goal status: INITIAL  2.  Pt will improve NDI to </= 8/50 for reduced pain levels and improved activity tolerance  Baseline: 15/50 Goal status: INITIAL  3.  Pt will improved left and right cervical rotation by 10 degrees each for improved safety w/driving  Baseline: Right: 38 degrees; left: 34 degrees  Goal status: INITIAL  4.  Pt will improve cervical extension by 10 degrees for improved ROM  Baseline: 25 degrees  Goal status: INITIAL    PLAN:  PT FREQUENCY:  2x/week for 4 weeks and 1x/week for 4 weeks  PT DURATION: 8 weeks  PLANNED INTERVENTIONS: Therapeutic exercises, Therapeutic activity, Neuromuscular re-education, Balance training, Gait training, Patient/Family education, Self Care, Joint mobilization, Joint manipulation, Aquatic Therapy, Dry Needling, Electrical stimulation, Spinal mobilization, Moist heat, Manual therapy, and Re-evaluation  PLAN FOR NEXT SESSION: HEP for improved ROM and periscapular strength, yoga moves, strength training, discuss possibility of d/c next week if symptoms have improved?   Peter Congo, PT, DPT, CSRS  05/20/2023, 10:15 AM

## 2023-05-24 ENCOUNTER — Ambulatory Visit: Payer: Medicare Other | Admitting: Physical Therapy

## 2023-05-24 ENCOUNTER — Encounter: Payer: Self-pay | Admitting: Physical Therapy

## 2023-05-24 DIAGNOSIS — R293 Abnormal posture: Secondary | ICD-10-CM | POA: Diagnosis not present

## 2023-05-24 DIAGNOSIS — M5481 Occipital neuralgia: Secondary | ICD-10-CM

## 2023-05-24 NOTE — Therapy (Signed)
OUTPATIENT PHYSICAL THERAPY CERVICAL TREATMENT/DISCHARGE   Patient Name: Alexis Frye MRN: [id number] DOB:12/28/1950, 72 y.o., female Today's Date: 05/24/2023   PHYSICAL THERAPY DISCHARGE SUMMARY  Visits from Start of Care: 5  Current functional level related to goals / functional outcomes: See below    Remaining deficits: See below    Education / Equipment: See below    Patient agrees to discharge. Patient goals were met. Patient is being discharged due to being pleased with the current functional level.   END OF SESSION:  PT End of Session - 05/24/23 0831     Visit Number 5    Number of Visits 5    Authorization Type [employer] Medicare    Progress Note Due on Visit 10    PT Start Time 0800    PT Stop Time 0830   session completed, all goals of session met   PT Time Calculation (min) 30 min    Activity Tolerance Patient tolerated treatment well    Behavior During Therapy [facility] for tasks assessed/performed                 Past Medical History:  Diagnosis Date   Arm pain left   Arthritis    GERD (gastroesophageal reflux disease)    Headache    Hyperlipemia    Hypertension    PONV (postoperative nausea and vomiting)    Seasonal allergies    Stroke (HCC)    2 TIAS 2012 and 2021   TIA (transient ischemic attack) 12/14/2010   no residual   TIA (transient ischemic attack) 2021   Wears glasses    Past Surgical History:  Procedure Laterality Date   ABDOMINAL HYSTERECTOMY     CARDIAC CATHETERIZATION N/A 04/01/2016   Procedure: Left Heart Cath and Coronary Angiography;  Surgeon: Peter M Swaziland, MD;  Location: [facility];  Service: Cardiovascular;  Laterality: N/A;   CATARACT EXTRACTION Bilateral 2020   CESAREAN SECTION     x2   CHOLECYSTECTOMY     COLONOSCOPY     CYST EXCISION Right 09/16/2021   Procedure: Excision of right long finger DIP ganglion cyst;  Surgeon: Allena Napoleon, MD;  Location: [facility];  Service: Plastics;   Laterality: Right;   DIAGNOSTIC LAPAROSCOPY     DILATION AND CURETTAGE OF UTERUS     LARYNGOSCOPY     vocal cord polyps   PANNICULECTOMY N/A 06/09/2021   Procedure: Infraumbilical panniculectomy;  Surgeon: Allena Napoleon, MD;  Location: [facility];  Service: Plastics;  Laterality: N/A;  90 min   SHOULDER ARTHROSCOPY WITH ROTATOR CUFF REPAIR Left 11/26/2014   Procedure: LEFT SHOULDER ARTHROSCOPY W/ SAD/DCR (no cuff repair);  Surgeon: Nestor Lewandowsky, MD;  Location: [facility];  Service: Orthopedics;  Laterality: Left;   TONSILLECTOMY     Patient Active Problem List   Diagnosis Date Noted   Transient ischemic attack (TIA) 08/15/2020   Cervicogenic headache 04/14/2017   Sleep apnea 10/15/2016   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   Essential hypertension    Pain in the chest    Chest pain 03/31/2016   White coat hypertension 03/31/2016   HLD (hyperlipidemia) 03/31/2016   Musculoskeletal pain 03/31/2016   GERD (gastroesophageal reflux disease) 03/31/2016   Left-sided headache 10/29/2015    PCP: Daisy Floro, MD   REFERRING PROVIDER: Levert Feinstein, MD  REFERRING DIAG: R51.9 (ICD-10-CM) - Left-sided headache   THERAPY DIAG:  Abnormal posture  Occipital neuralgia of left side  Rationale for  Evaluation and Treatment: Rehabilitation  ONSET DATE: 05/03/2023  SUBJECTIVE:                                                                                                                                                                                                         SUBJECTIVE STATEMENT:  I'm feeling really good, I feel like I'm at the point where I might be able to stop. Doing stretches on my own, the motion in my neck is really wonderful. Feel like I'm where I'd like to be.   Hand dominance: Right  PERTINENT HISTORY:  She was seen by stroke service in the past, presented on August 15, 2020 with sudden onset of left facial and hand paresthesia, left leg weakness, MRI of  the brain showed no acute abnormality, was given the diagnosis of TIA, vascular risk factor of hypertension, aging, hyperlipidemia  PAIN:  Are you having pain? No 0/10   PRECAUTIONS: None  WEIGHT BEARING RESTRICTIONS: No  FALLS:  Has patient fallen in last 6 months? No  LIVING ENVIRONMENT: Lives with: lives with their family and lives with their spouse Lives in: House/apartment Stairs: Yes: External: 1-2 steps; none Has following equipment at home: None  OCCUPATION: Retired from [employer]   PLOF: Independent  PATIENT GOALS: "I'd like to find out what can relieve the pressure in my head"   NEXT MD VISIT: Has referral to [facility] - no appt date yet  OBJECTIVE:   DIAGNOSTIC FINDINGS:  No relevant imaging on file   PATIENT SURVEYS:  NDI 15/50 (moderate disability) ; 05/24/23- NDI 1/50   COGNITION: Overall cognitive status: Within functional limits for tasks assessed  SENSATION: Pt denies numbness/tingling   POSTURE: rounded shoulders, forward head, and head cocked to L   PALPATION: TTP of L upper trap and suboccipitals    CERVICAL ROM: Tested in seated position   Active ROM A/PROM (deg) eval AROM 05/24/23  Flexion 32 63*  Extension 25 29*  Right lateral flexion 15 38*  Left lateral flexion 22 38*  Right rotation 38 67*  Left rotation 34 56*   (Blank rows = not tested)    TODAY'S TREATMENT 05/24/23  NDI, goal review/education on progress, updated HEP, education on DC today and progress with PT  TherEx  Scapular retractions yellow TB x10 with 3 second holds Shoulder extensions yellow TB x10 with 3 second holds Scap retractions + ER with yellow TB x10 Discussed chair pushups for tricep activation  PATIENT EDUCATION:  Education details: Updates to HEP, PT POC with possibility of d/c sooner pending  progress Person educated: Patient Education method: Explanation and Handouts Education comprehension: verbalized understanding  HOME EXERCISE PROGRAM:  Access Code: [email or web address] URL: [email or web address] Date: 05/24/2023 Prepared by: Nedra Hai  Exercises - Seated Upper Trapezius Stretch  - 1 x daily - 7 x weekly - 1 sets - 5 reps - 30 sec hold - Gentle Levator Scapulae Stretch  - 1 x daily - 7 x weekly - 1 sets - 5 reps - 30 sec hold - Seated Assisted Cervical Rotation with Towel  - 1 x daily - 7 x weekly - 1 sets - 5 reps - 30 sec hold - Supine Suboccipital Release with Tennis Balls  - 1 x daily - 7 x weekly - 1 sets - 1 reps - 5-10 min hold - Plank with Thoracic Rotation on Counter  - 1 x daily - 7 x weekly - 3 sets - 10 reps - Quadruped Full Range Thoracic Rotation with Reach  - 1 x daily - 7 x weekly - 3 sets - 10 reps - Cervical Retraction at Wall  - 1 x daily - 7 x weekly - 3 sets - 10 reps - 5-10 second hold - Supine Thoracic Mobilization Towel Roll Vertical with Arm Stretch  - 1 x daily - 7 x weekly - 2 sets - 15 reps - Scapular Retraction with Resistance  - 1 x daily - 7 x weekly - 3 sets - 10-15 reps - 5 hold - Shoulder External Rotation and Scapular Retraction with Resistance  - 1 x daily - 7 x weekly - 3 sets - 10 reps - Shoulder extension with resistance - Neutral  - 1 x daily - 7 x weekly - 3 sets - 10 reps  Provided PVC passthrough on word document on 6/3   ASSESSMENT:  CLINICAL IMPRESSION:  Ms. Suniga arrives today doing really well, relays that she feels ready for DC today. Completed NDI as well as all objective measures and goal review/pt education on updated HEP, progress with PT, and DC today. Did very well with PT, if anything new arises she is welcome back with new MD referral.    OBJECTIVE IMPAIRMENTS: decreased mobility, decreased ROM, increased muscle spasms, improper body mechanics, and pain  ACTIVITY LIMITATIONS: bending  PARTICIPATION  LIMITATIONS: driving  PERSONAL FACTORS: Age, Fitness, Past/current experiences, and 1 comorbidity: HTN  are also affecting patient's functional outcome.   REHAB POTENTIAL: Good  CLINICAL DECISION MAKING: Stable/uncomplicated  EVALUATION COMPLEXITY: Low   GOALS: Goals reviewed with patient? Yes  SHORT TERM GOALS: Target date: 06/02/2023  Pt will be independent with initial HEP for improved strength, ROM and functional use of cervical spine.  Baseline:  Goal status: MET 05/24/23  2.  Pt will improve R cervical lateral flexion by 10 degrees for improved cervical functional mobility and reduced pain  Baseline: 15 degrees  Goal status: MET 05/24/23   LONG TERM GOALS: Target date: 06/30/2023   Pt will be independent with final HEP for improved strength, ROM and functional use of cervical spine Baseline:  Goal status: MET 05/24/23   2.  Pt will improve NDI to </= 8/50 for reduced pain levels and improved activity tolerance  Baseline: 15/50 Goal status: MET 05/24/23   3.  Pt will improved left and right cervical rotation by 10 degrees each for improved safety w/driving  Baseline: Right: 38 degrees; left: 34 degrees  Goal status: MET 05/24/23  4.  Pt will improve cervical extension by 10 degrees for improved ROM  Baseline: 25 degrees  Goal status: NOT MET 05/24/23    PLAN:  PT FREQUENCY:  2x/week for 4 weeks and 1x/week for 4 weeks  PT DURATION: 8 weeks  PLANNED INTERVENTIONS: Therapeutic exercises, Therapeutic activity, Neuromuscular re-education, Balance training, Gait training, Patient/Family education, Self Care, Joint mobilization, Joint manipulation, Aquatic Therapy, Dry Needling, Electrical stimulation, Spinal mobilization, Moist heat, Manual therapy, and Re-evaluation  PLAN FOR NEXT SESSION: DC today  Nedra Hai PT DPT PN2

## 2023-05-27 ENCOUNTER — Ambulatory Visit: Payer: Medicare Other | Admitting: Physical Therapy

## 2023-05-31 ENCOUNTER — Ambulatory Visit: Payer: Medicare Other | Admitting: Physical Therapy

## 2023-06-03 ENCOUNTER — Ambulatory Visit: Payer: Medicare Other | Admitting: Physical Therapy

## 2023-06-11 ENCOUNTER — Ambulatory Visit: Payer: Medicare Other | Admitting: Physical Therapy

## 2023-06-18 ENCOUNTER — Ambulatory Visit: Payer: Medicare Other | Admitting: Physical Therapy

## 2023-06-21 ENCOUNTER — Ambulatory Visit: Payer: Medicare Other | Admitting: Physical Therapy

## 2023-06-28 ENCOUNTER — Ambulatory Visit: Payer: Medicare Other | Admitting: Physical Therapy

## 2023-06-29 ENCOUNTER — Ambulatory Visit: Payer: Medicare Other | Admitting: Neurology

## 2024-04-24 DIAGNOSIS — Z6834 Body mass index (BMI) 34.0-34.9, adult: Secondary | ICD-10-CM | POA: Diagnosis not present

## 2024-04-24 DIAGNOSIS — F430 Acute stress reaction: Secondary | ICD-10-CM | POA: Diagnosis not present

## 2024-08-01 DIAGNOSIS — B351 Tinea unguium: Secondary | ICD-10-CM | POA: Diagnosis not present

## 2024-08-01 DIAGNOSIS — Z6834 Body mass index (BMI) 34.0-34.9, adult: Secondary | ICD-10-CM | POA: Diagnosis not present

## 2024-08-01 DIAGNOSIS — K5792 Diverticulitis of intestine, part unspecified, without perforation or abscess without bleeding: Secondary | ICD-10-CM | POA: Diagnosis not present

## 2024-08-01 DIAGNOSIS — M5481 Occipital neuralgia: Secondary | ICD-10-CM | POA: Diagnosis not present

## 2024-09-28 DIAGNOSIS — E782 Mixed hyperlipidemia: Secondary | ICD-10-CM | POA: Diagnosis not present

## 2024-09-28 DIAGNOSIS — I679 Cerebrovascular disease, unspecified: Secondary | ICD-10-CM | POA: Diagnosis not present

## 2024-09-28 DIAGNOSIS — R0609 Other forms of dyspnea: Secondary | ICD-10-CM | POA: Diagnosis not present

## 2024-09-28 DIAGNOSIS — Z6833 Body mass index (BMI) 33.0-33.9, adult: Secondary | ICD-10-CM | POA: Diagnosis not present

## 2024-09-28 DIAGNOSIS — I10 Essential (primary) hypertension: Secondary | ICD-10-CM | POA: Diagnosis not present

## 2024-09-28 DIAGNOSIS — E559 Vitamin D deficiency, unspecified: Secondary | ICD-10-CM | POA: Diagnosis not present

## 2024-10-02 DIAGNOSIS — M5481 Occipital neuralgia: Secondary | ICD-10-CM | POA: Diagnosis not present

## 2024-10-02 DIAGNOSIS — E782 Mixed hyperlipidemia: Secondary | ICD-10-CM | POA: Diagnosis not present

## 2024-10-02 DIAGNOSIS — Z6833 Body mass index (BMI) 33.0-33.9, adult: Secondary | ICD-10-CM | POA: Diagnosis not present

## 2024-10-02 DIAGNOSIS — K219 Gastro-esophageal reflux disease without esophagitis: Secondary | ICD-10-CM | POA: Diagnosis not present

## 2024-10-02 DIAGNOSIS — R32 Unspecified urinary incontinence: Secondary | ICD-10-CM | POA: Diagnosis not present

## 2024-10-02 DIAGNOSIS — Z0000 Encounter for general adult medical examination without abnormal findings: Secondary | ICD-10-CM | POA: Diagnosis not present

## 2024-10-02 DIAGNOSIS — I10 Essential (primary) hypertension: Secondary | ICD-10-CM | POA: Diagnosis not present

## 2024-10-02 DIAGNOSIS — Z23 Encounter for immunization: Secondary | ICD-10-CM | POA: Diagnosis not present

## 2024-10-02 DIAGNOSIS — F430 Acute stress reaction: Secondary | ICD-10-CM | POA: Diagnosis not present

## 2024-10-03 ENCOUNTER — Ambulatory Visit: Payer: Self-pay | Admitting: Physician Assistant

## 2024-10-03 ENCOUNTER — Encounter: Payer: Self-pay | Admitting: Physician Assistant

## 2024-10-03 ENCOUNTER — Ambulatory Visit: Attending: Physician Assistant | Admitting: Physician Assistant

## 2024-10-03 VITALS — BP 124/66 | HR 65 | Resp 16 | Ht 63.0 in | Wt 189.4 lb

## 2024-10-03 DIAGNOSIS — I10 Essential (primary) hypertension: Secondary | ICD-10-CM | POA: Diagnosis not present

## 2024-10-03 DIAGNOSIS — I2510 Atherosclerotic heart disease of native coronary artery without angina pectoris: Secondary | ICD-10-CM | POA: Diagnosis not present

## 2024-10-03 DIAGNOSIS — E7800 Pure hypercholesterolemia, unspecified: Secondary | ICD-10-CM | POA: Diagnosis not present

## 2024-10-03 DIAGNOSIS — R072 Precordial pain: Secondary | ICD-10-CM

## 2024-10-03 DIAGNOSIS — R0602 Shortness of breath: Secondary | ICD-10-CM

## 2024-10-03 LAB — CBC
Hematocrit: 43.9 % (ref 34.0–46.6)
Hemoglobin: 14.5 g/dL (ref 11.1–15.9)
MCH: 30.8 pg (ref 26.6–33.0)
MCHC: 33 g/dL (ref 31.5–35.7)
MCV: 93 fL (ref 79–97)
Platelets: 209 x10E3/uL (ref 150–450)
RBC: 4.71 x10E6/uL (ref 3.77–5.28)
RDW: 14 % (ref 11.7–15.4)
WBC: 9.2 x10E3/uL (ref 3.4–10.8)

## 2024-10-03 LAB — BASIC METABOLIC PANEL WITH GFR
BUN/Creatinine Ratio: 27 (ref 12–28)
BUN: 21 mg/dL (ref 8–27)
CO2: 27 mmol/L (ref 20–29)
Calcium: 9.8 mg/dL (ref 8.7–10.3)
Chloride: 106 mmol/L (ref 96–106)
Creatinine, Ser: 0.79 mg/dL (ref 0.57–1.00)
Glucose: 93 mg/dL (ref 70–99)
Potassium: 4.1 mmol/L (ref 3.5–5.2)
Sodium: 141 mmol/L (ref 134–144)
eGFR: 80 mL/min/1.73 (ref >59)

## 2024-10-03 MED ORDER — NITROGLYCERIN 0.4 MG SL SUBL: 0.4000 mg | SUBLINGUAL_TABLET | SUBLINGUAL | 3 refills | Status: AC | PRN

## 2024-10-03 MED ORDER — METOPROLOL SUCCINATE ER 25 MG PO TB24: 25.0000 mg | ORAL_TABLET | Freq: Every day | ORAL | 11 refills | Status: AC

## 2024-10-03 NOTE — Progress Notes (Signed)
 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 History of Present Illness Alexis Frye is a 73 y.o. female who is referred by Okey Carlin Redbird, MD for shortness of breath and chest pain.   She has been experiencing chest discomfort and shortness of breath for the past two months, which became concerning during a trip to [place]. While on a road trip around [facility], she encountered difficulty breathing after climbing 200 steps twice, necessitating rest to catch her breath. Similar episodes occurred during walks which included inclines at the park.  The chest pain has been present since returning from the trip, with a constant pressure in the center of her chest for the past two weeks. She describes it as a 'tightness' that worsens with activity, such as walking or doing household chores. The pain is less noticeable upon waking but intensifies with movement. It does not radiate to her neck or arms but is sometimes felt in her back.  She has had some pleuritic symptoms.  Family history is significant for heart disease, with both sisters having had heart attacks and two brothers having stents placed. Her mother had a stroke while hospitalized for emphysema.   She does not smoke, drinks occasionally, and has never used drugs. 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  No fever, cold symptoms, or recent illness.  There is no history of syncope, peripheral edema, or paroxysmal nocturnal dyspnea. She reports a dry cough and denies any gastrointestinal symptoms such as vomiting, diarrhea, or changes in stool color.    Review of Systems  Constitutional: Negative for chills and fever.  Respiratory:  Positive for cough. Negative for sputum production.   Gastrointestinal:  Negative for hematochezia and melena.  Genitourinary:  Negative for hematuria.  -See HPI    Studies Reviewed:  EKG Interpretation Date/Time:  Tuesday October 03 2024 13:56:23 EDT Ventricular Rate:  64 PR Interval:  182 QRS Duration:  94 QT Interval:  422 QTC Calculation: 435 R Axis:   -41  Text Interpretation: Normal sinus rhythm Left axis deviation Moderate voltage criteria for LVH, may be normal variant Nonspecific T wave abnormality Poor R wave progression No significant change since last tracing Confirmed by Lelon Hamilton [phone]) on 10/03/2024 2:15:04 PM    Labs from PCP personally reviewed and interpreted 09/2024: TC 166, HDL 42, Trig 178, LDL 93, SCr 0.82, K 4.8, eGFR 76, ALT 22  09/23/2023: Total cholesterol 155, triglycerides 152, HDL 47, LDL 81         Physical Exam:  VS:  BP 124/66 (BP Location: Left Arm, Patient Position: Sitting, Cuff Size: Large)   Pulse 65   Resp 16   Ht 5' 3 (1.6 m)   Wt 189 lb 6.4 oz (85.9 kg)   SpO2 97%   BMI 33.55 kg/m        Wt  Readings from Last 3 Encounters:  10/03/24 189 lb 6.4 oz (85.9 kg)  05/03/23 180 lb 8 oz (81.9 kg)  09/16/21 183 lb 3.2 oz (83.1 kg)    Constitutional:      Appearance: Healthy appearance. Not in distress.  Neck:     Vascular: No carotid bruit or JVR. JVD normal.  Pulmonary:     Breath sounds: Normal breath sounds. No wheezing. No rales.  Cardiovascular:     Normal rate. Regular rhythm.     Murmurs: There is a grade 2/6 systolic murmur at the URSB and ULSB.  Edema:    Peripheral edema absent.  Abdominal:     Palpations: Abdomen is soft.        Assessment and Plan:    Assessment & Plan Precordial chest pain Shortness of breath Coronary artery disease involving native coronary artery of native heart without angina pectoris Pt was seen by Cardiology in 2017 in the hospital for chest pain. Cardiac catheterization at that time demonstrated minimal nonobstructive coronary artery disease. She presents today with intermittent chest pain and exertional dyspnea for two months, with constant pressure in the chest since returning from a trip. Pain worsens with activity and is relieved slightly by rest. Symptoms sound consistent with unstable angina.  Her EKG does not demonstrate any acute changes.  We discussed the rationale for stress testing versus coronary CTA versus cardiac catheterization.  Given her severe and escalating symptoms, I have recommended proceeding with cardiac catheterization.  I have reviewed this with Dr. Sheena (attending MD) who agreed. - Arrange heart catheterization at [facility] this week. - Arrange echocardiogram to rule out structural heart disease  - Prescribed metoprolol succinate 25 mg once daily  - Prescribed nitroglycerin PRN - Continue aspirin  81 mg daily, Lipitor 20 mg daily - She knows to go to the ED if her symptoms worsen - Follow up post cardiac catheterization.  Essential hypertension Blood pressure well-controlled. - Continue amlodipine/benazepril 5/20 mg daily - Add metoprolol succinate 25 mg daily for antianginal therapy Pure hypercholesterolemia If she has obstructive CAD, goal LDL will be <55. - Continue Atorvastatin  20 mg once daily     Informed Consent   Shared Decision Making/Informed Consent The risks [stroke (1 in 1000), death (1 in 1000), kidney failure [usually temporary] (1 in 500), bleeding (1 in 200), allergic reaction [possibly serious] (1 in 200)], benefits (diagnostic support and management of coronary artery disease) and alternatives of a cardiac catheterization were discussed in  detail with Alexis Frye and she is willing to proceed.     Dispo:  Return in 4 weeks (on 10/31/2024) for Post Procedure Follow Up w/ Dr. Sheena.  Signed, Glendia Ferrier, PA-C

## 2024-10-03 NOTE — H&P (View-Only) (Signed)
 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 History of Present Illness Alexis Frye is a 73 y.o. female who is referred by Alexis Carlin Redbird, MD for shortness of breath and chest pain.   She has been experiencing chest discomfort and shortness of breath for the past two months, which became concerning during a trip to [place]. While on a road trip around [facility], she encountered difficulty breathing after climbing 200 steps twice, necessitating rest to catch her breath. Similar episodes occurred during walks which included inclines at the park.  The chest pain has been present since returning from the trip, with a constant pressure in the center of her chest for the past two weeks. She describes it as a 'tightness' that worsens with activity, such as walking or doing household chores. The pain is less noticeable upon waking but intensifies with movement. It does not radiate to her neck or arms but is sometimes felt in her back.  She has had some pleuritic symptoms.  Family history is significant for heart disease, with both sisters having had heart attacks and two brothers having stents placed. Her mother had a stroke while hospitalized for emphysema.   She does not smoke, drinks occasionally, and has never used drugs. 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  No fever, cold symptoms, or recent illness.  There is no history of syncope, peripheral edema, or paroxysmal nocturnal dyspnea. She reports a dry cough and denies any gastrointestinal symptoms such as vomiting, diarrhea, or changes in stool color.    Review of Systems  Constitutional: Negative for chills and fever.  Respiratory:  Positive for cough. Negative for sputum production.   Gastrointestinal:  Negative for hematochezia and melena.  Genitourinary:  Negative for hematuria.  -See HPI    Studies Reviewed:  EKG Interpretation Date/Time:  Tuesday October 03 2024 13:56:23 EDT Ventricular Rate:  64 PR Interval:  182 QRS Duration:  94 QT Interval:  422 QTC Calculation: 435 R Axis:   -41  Text Interpretation: Normal sinus rhythm Left axis deviation Moderate voltage criteria for LVH, may be normal variant Nonspecific T wave abnormality Poor R wave progression No significant change since last tracing Confirmed by Lelon Hamilton [phone]) on 10/03/2024 2:15:04 PM    Labs from PCP personally reviewed and interpreted 09/2024: TC 166, HDL 42, Trig 178, LDL 93, SCr 0.82, K 4.8, eGFR 76, ALT 22  09/23/2023: Total cholesterol 155, triglycerides 152, HDL 47, LDL 81         Physical Exam:  VS:  BP 124/66 (BP Location: Left Arm, Patient Position: Sitting, Cuff Size: Large)   Pulse 65   Resp 16   Ht 5' 3 (1.6 m)   Wt 189 lb 6.4 oz (85.9 kg)   SpO2 97%   BMI 33.55 kg/m        Wt  Readings from Last 3 Encounters:  10/03/24 189 lb 6.4 oz (85.9 kg)  05/03/23 180 lb 8 oz (81.9 kg)  09/16/21 183 lb 3.2 oz (83.1 kg)    Constitutional:      Appearance: Healthy appearance. Not in distress.  Neck:     Vascular: No carotid bruit or JVR. JVD normal.  Pulmonary:     Breath sounds: Normal breath sounds. No wheezing. No rales.  Cardiovascular:     Normal rate. Regular rhythm.     Murmurs: There is a grade 2/6 systolic murmur at the URSB and ULSB.  Edema:    Peripheral edema absent.  Abdominal:     Palpations: Abdomen is soft.        Assessment and Plan:    Assessment & Plan Precordial chest pain Shortness of breath Coronary artery disease involving native coronary artery of native heart without angina pectoris Pt was seen by Cardiology in 2017 in the hospital for chest pain. Cardiac catheterization at that time demonstrated minimal nonobstructive coronary artery disease. She presents today with intermittent chest pain and exertional dyspnea for two months, with constant pressure in the chest since returning from a trip. Pain worsens with activity and is relieved slightly by rest. Symptoms sound consistent with unstable angina.  Her EKG does not demonstrate any acute changes.  We discussed the rationale for stress testing versus coronary CTA versus cardiac catheterization.  Given her severe and escalating symptoms, I have recommended proceeding with cardiac catheterization.  I have reviewed this with Dr. Sheena (attending MD) who agreed. - Arrange heart catheterization at [facility] this week. - Arrange echocardiogram to rule out structural heart disease  - Prescribed metoprolol succinate 25 mg once daily  - Prescribed nitroglycerin PRN - Continue aspirin  81 mg daily, Lipitor 20 mg daily - She knows to go to the ED if her symptoms worsen - Follow up post cardiac catheterization.  Essential hypertension Blood pressure well-controlled. - Continue amlodipine/benazepril 5/20 mg daily - Add metoprolol succinate 25 mg daily for antianginal therapy Pure hypercholesterolemia If she has obstructive CAD, goal LDL will be <55. - Continue Atorvastatin  20 mg once daily     Informed Consent   Shared Decision Making/Informed Consent The risks [stroke (1 in 1000), death (1 in 1000), kidney failure [usually temporary] (1 in 500), bleeding (1 in 200), allergic reaction [possibly serious] (1 in 200)], benefits (diagnostic support and management of coronary artery disease) and alternatives of a cardiac catheterization were discussed in  detail with Alexis Frye and she is willing to proceed.     Dispo:  Return in 4 weeks (on 10/31/2024) for Post Procedure Follow Up w/ Dr. Sheena.  Signed, Glendia Ferrier, PA-C

## 2024-10-03 NOTE — Assessment & Plan Note (Signed)
 Blood pressure well-controlled. - Continue amlodipine/benazepril 5/20 mg daily - Add metoprolol succinate 25 mg daily for antianginal therapy

## 2024-10-03 NOTE — Assessment & Plan Note (Signed)
 If she has obstructive CAD, goal LDL will be <55. - Continue Atorvastatin  20 mg once daily

## 2024-10-03 NOTE — Patient Instructions (Signed)
 Medication Instructions:   START Toprol XL one ( 1) tablet by mouth ( 25 mg) nightly.   START nitroglycerin Place 1 tablet (0.4 mg total) under the tongue every 5 (five) minutes as needed for chest pain. - Sublingual.   If a single episode of chest pain is not relieved by one tablet, the patient will try another within 5 minutes; and if this doesn't relieve the pain, the patient will try another within 5 minutes and if this doesn't relieve the pain the patient is instructed to call 911 for transportation to an emergency department.  *If you need a refill on your cardiac medications before your next appointment, please call your pharmacy*  Lab Work:  TODAY!!! STAT BMET/CBC  If you have labs (blood work) drawn today and your tests are completely normal, you will receive your results only by: MyChart Message (if you have MyChart) OR A paper copy in the mail If you have any lab test that is abnormal or we need to change your treatment, we will call you to review the results.  Testing/Procedures:        Cardiac/Peripheral Catheterization   You are scheduled for a Cardiac Catheterization on Wednesday, October 22 with Dr. Lonni Cash.  1. Please arrive at the [facility] (Main Entrance A) at [facility]: [address] [place], [place] [address] at 11:00 AM (This time is 2 hour(s) before your procedure to ensure your preparation).   Free valet parking service is available. You will check in at ADMITTING. The support person will be asked to wait in the waiting room.  It is OK to have someone drop you off and come back when you are ready to be discharged.        Special note: Every effort is made to have your procedure done on time. Please understand that emergencies sometimes delay scheduled procedures.  2. Diet: Nothing to eat after midnight.  3. Hydration:You need to be well hydrated before your procedure. On October 22, you may drink approved liquids (see below) until  2 hours before the procedure, with 16 oz of water as your last intake.   List of approved liquids water, clear juice, clear tea, black coffee, fruit juices, non-citric and without pulp, carbonated beverages, Gatorade, Kool -Aid, plain Jello-O and plain ice popsicles.  4. Labs: You will need to have blood drawn on Wednesday, October 21 at [facility] (1st Floor), [address], [place], [place] [address]. You do not need to be fasting.  5. Medication instructions in preparation for your procedure:   Contrast Allergy: No    Current Outpatient Medications (Cardiovascular):    amLODipine-benazepril (LOTREL) 5-20 MG capsule, Take 1 capsule by mouth every other day.   atorvastatin  (LIPITOR) 20 MG tablet, Take 20 mg by mouth daily.   metoprolol succinate (TOPROL-XL) 25 MG 24 hr tablet, Take 1 tablet (25 mg total) by mouth daily.   nitroGLYCERIN (NITROSTAT) 0.4 MG SL tablet, Place 1 tablet (0.4 mg total) under the tongue every 5 (five) minutes as needed for chest pain.  Current Outpatient Medications (Respiratory):    loratadine  (CLARITIN ) 10 MG tablet, Take 10 mg by mouth daily.  Current Outpatient Medications (Analgesics):    aspirin  EC 81 MG tablet, Take 81-162 mg by mouth See admin instructions. Take 81 mg in the morning and 162 mg at bedtime  Current Outpatient Medications (Hematological):    Cyanocobalamin (VITAMIN B 12) 500 MCG TABS, Take  5 tablets by mouth daily.  Current Outpatient Medications (Other):    CALCIUM  GLUCONATE PO, Take 600 mg by mouth daily.   Cholecalciferol (VITAMIN D) 125 MCG (5000 UT) CAPS, Take 15,000 Units by mouth daily.   gabapentin (NEURONTIN) 300 MG capsule, Take 300-600 mg by mouth See admin instructions. Take 300 mg in the morning and 600 mg at bedtime   Homeopathic Products [facility] ALLERGY EYE RELIEF) SOLN, Place 1 drop into both eyes daily.   Magnesium 250 MG TABS, Take 1 tablet by mouth daily.   Omega-3  Fatty Acids (FISH OIL) 1000 MG CAPS, Take 1 capsule by mouth daily.   omeprazole (PRILOSEC) 20 MG capsule, Take 20 mg by mouth every other day.   terbinafine (LAMISIL) 250 MG tablet, Take 250 mg by mouth daily.   Turmeric 500 MG CAPS, Take 1 tablet by mouth daily after breakfast. *For reference purposes while preparing patient instructions.   Delete this med list prior to printing instructions for patient.*          On the morning of your procedure, take Aspirin  81 mg and any morning medicines NOT listed above.  You may use sips of water.  6. Plan to go home the same day, you will only stay overnight if medically necessary. 7. You MUST have a responsible adult to drive you home. 8. An adult MUST be with you the first 24 hours after you arrive home. 9. Bring a current list of your medications, and the last time and date medication taken. 10. Bring ID and current insurance cards. 11.Please wear clothes that are easy to get on and off and wear slip-on shoes.  Thank you for allowing us  to care for you!   -- [facility]   Follow-Up: At [facility], you and your health needs are our priority.  As part of our continuing mission to provide you with exceptional heart care, our providers are all part of one team.  This team includes your primary Cardiologist (physician) and Advanced Practice Providers or APPs (Physician Assistants and Nurse Practitioners) who all work together to provide you with the care you need, when you need it.  Your next appointment:   4 week(s)  Provider:   Dr. Sheena    We recommend signing up for the patient portal called MyChart.  Sign up information is provided on this After Visit Summary.  MyChart is used to connect with patients for Virtual Visits (Telemedicine).  Patients are able to view lab/test results, encounter notes, upcoming appointments, etc.  Non-urgent messages can be sent to your provider as well.   To  learn more about what you can do with MyChart, go to [email or web address].   Other Instructions  Your physician has requested that you have an echocardiogram. Echocardiography is a painless test that uses sound waves to create images of your heart. It provides your doctor with information about the size and shape of your heart and how well your heart's chambers and valves are working. This procedure takes approximately one hour. There are no restrictions for this procedure. Please do NOT wear cologne, perfume or lotions (deodorant is allowed). Please arrive 15 minutes prior to your appointment time.   IF ANYTHING CHANGES WITH YOUR SYMPTOMS AND YOU HAVE TO TAKE A NITROGLYCERIN CALL 911.

## 2024-10-04 ENCOUNTER — Other Ambulatory Visit: Payer: Self-pay

## 2024-10-04 ENCOUNTER — Encounter (HOSPITAL_COMMUNITY)
Admission: RE | Disposition: A | Discharge status: Home / Self Care | Payer: Self-pay | Source: Home / Self Care | Attending: Cardiovascular Disease

## 2024-10-04 ENCOUNTER — Ambulatory Visit (HOSPITAL_COMMUNITY)
Admission: RE | Admit: 2024-10-04 | Discharge: 2024-10-04 | Disposition: A | Discharge status: Home / Self Care | Attending: Cardiovascular Disease | Admitting: Cardiovascular Disease

## 2024-10-04 DIAGNOSIS — Z79899 Other long term (current) drug therapy: Secondary | ICD-10-CM | POA: Diagnosis not present

## 2024-10-04 DIAGNOSIS — Z8249 Family history of ischemic heart disease and other diseases of the circulatory system: Secondary | ICD-10-CM | POA: Insufficient documentation

## 2024-10-04 DIAGNOSIS — I10 Essential (primary) hypertension: Secondary | ICD-10-CM | POA: Insufficient documentation

## 2024-10-04 DIAGNOSIS — Z7982 Long term (current) use of aspirin: Secondary | ICD-10-CM | POA: Insufficient documentation

## 2024-10-04 DIAGNOSIS — R0602 Shortness of breath: Secondary | ICD-10-CM | POA: Diagnosis not present

## 2024-10-04 DIAGNOSIS — I2510 Atherosclerotic heart disease of native coronary artery without angina pectoris: Secondary | ICD-10-CM | POA: Insufficient documentation

## 2024-10-04 DIAGNOSIS — R079 Chest pain, unspecified: Secondary | ICD-10-CM | POA: Diagnosis not present

## 2024-10-04 DIAGNOSIS — E7800 Pure hypercholesterolemia, unspecified: Secondary | ICD-10-CM | POA: Diagnosis not present

## 2024-10-04 DIAGNOSIS — R072 Precordial pain: Secondary | ICD-10-CM

## 2024-10-04 HISTORY — PX: LEFT HEART CATH AND CORONARY ANGIOGRAPHY: CATH118249

## 2024-10-04 SURGERY — LEFT HEART CATH AND CORONARY ANGIOGRAPHY
Anesthesia: LOCAL

## 2024-10-04 MED ORDER — FREE WATER: 500.0000 mL | Freq: Once | Status: DC

## 2024-10-04 MED ORDER — MIDAZOLAM HCL 2 MG/2ML IJ SOLN
INTRAMUSCULAR | Status: AC
  Filled 2024-10-04: qty 2

## 2024-10-04 MED ORDER — HEPARIN SODIUM (PORCINE) 1000 UNIT/ML IJ SOLN
INTRAMUSCULAR | Status: AC
  Filled 2024-10-04: qty 10

## 2024-10-04 MED ORDER — ONDANSETRON HCL 4 MG/2ML IJ SOLN: 4.0000 mg | Freq: Four times a day (QID) | INTRAMUSCULAR | Status: DC | PRN

## 2024-10-04 MED ORDER — LIDOCAINE HCL (PF) 1 % IJ SOLN
INTRAMUSCULAR | Status: AC
  Filled 2024-10-04: qty 30

## 2024-10-04 MED ORDER — VERAPAMIL HCL 2.5 MG/ML IV SOLN
INTRAVENOUS | Status: DC | PRN
  Administered 2024-10-04: 10 mL via INTRA_ARTERIAL

## 2024-10-04 MED ORDER — VERAPAMIL HCL 2.5 MG/ML IV SOLN
INTRAVENOUS | Status: AC
  Filled 2024-10-04: qty 2

## 2024-10-04 MED ORDER — SODIUM CHLORIDE 0.9% FLUSH: 3.0000 mL | Freq: Two times a day (BID) | INTRAVENOUS | Status: DC

## 2024-10-04 MED ORDER — SODIUM CHLORIDE 0.9% FLUSH: 3.0000 mL | INTRAVENOUS | Status: DC | PRN

## 2024-10-04 MED ORDER — LABETALOL HCL 5 MG/ML IV SOLN: 10.0000 mg | INTRAVENOUS | Status: DC | PRN

## 2024-10-04 MED ORDER — IOHEXOL 350 MG/ML SOLN
INTRAVENOUS | Status: DC | PRN
  Administered 2024-10-04: 45 mL

## 2024-10-04 MED ORDER — LIDOCAINE HCL (PF) 1 % IJ SOLN
INTRAMUSCULAR | Status: DC | PRN
  Administered 2024-10-04: 2 mL

## 2024-10-04 MED ORDER — MIDAZOLAM HCL (PF) 2 MG/2ML IJ SOLN
INTRAMUSCULAR | Status: DC | PRN
  Administered 2024-10-04: 1 mg via INTRAVENOUS

## 2024-10-04 MED ORDER — SODIUM CHLORIDE 0.9 % IV SOLN: 250.0000 mL | INTRAVENOUS | Status: DC | PRN

## 2024-10-04 MED ORDER — HEPARIN (PORCINE) IN NACL 1000-0.9 UT/500ML-% IV SOLN
INTRAVENOUS | Status: DC | PRN
  Administered 2024-10-04: 1000 mL

## 2024-10-04 MED ORDER — HEPARIN SODIUM (PORCINE) 1000 UNIT/ML IJ SOLN
INTRAMUSCULAR | Status: DC | PRN
  Administered 2024-10-04: 4500 [IU] via INTRAVENOUS

## 2024-10-04 MED ORDER — FENTANYL CITRATE (PF) 100 MCG/2ML IJ SOLN
INTRAMUSCULAR | Status: AC
  Filled 2024-10-04: qty 2

## 2024-10-04 MED ORDER — HYDRALAZINE HCL 20 MG/ML IJ SOLN: 10.0000 mg | INTRAMUSCULAR | Status: DC | PRN

## 2024-10-04 MED ORDER — FENTANYL CITRATE (PF) 100 MCG/2ML IJ SOLN
INTRAMUSCULAR | Status: DC | PRN
  Administered 2024-10-04: 25 ug via INTRAVENOUS

## 2024-10-04 MED ORDER — ASPIRIN 81 MG PO CHEW: 81.0000 mg | CHEWABLE_TABLET | ORAL | Status: DC

## 2024-10-04 MED ORDER — ACETAMINOPHEN 325 MG PO TABS: 650.0000 mg | ORAL_TABLET | ORAL | Status: DC | PRN

## 2024-10-04 SURGICAL SUPPLY — 5 items
CATH 5FR JL3.5 JR4 ANG PIG MP (CATHETERS) IMPLANT
GLIDESHEATH SLEND SS 6F .021 (SHEATH) IMPLANT
GUIDEWIRE INQWIRE 1.5J.035X260 (WIRE) IMPLANT
PACK CARDIAC CATHETERIZATION (CUSTOM PROCEDURE TRAY) ×2 IMPLANT
SET ATX-X65L (MISCELLANEOUS) IMPLANT

## 2024-10-04 NOTE — Progress Notes (Signed)
 Patient and husband was given discharge instructions. Both verbalized understanding.

## 2024-10-04 NOTE — Interval H&P Note (Signed)
 History and Physical Interval Note:  10/04/2024 12:23 PM  Alexis Frye  has presented today for surgery, with the diagnosis of unstable angina.  The various methods of treatment have been discussed with the patient and family. After consideration of risks, benefits and other options for treatment, the patient has consented to  Procedure(s): LEFT HEART CATH AND CORONARY ANGIOGRAPHY (N/A) as a surgical intervention.  The patient's history has been reviewed, patient examined, no change in status, stable for surgery.  I have reviewed the patient's chart and labs.  Questions were answered to the patient's satisfaction.    Cath Lab Visit (complete for each Cath Lab visit)  Clinical Evaluation Leading to the Procedure:   ACS: No.  Non-ACS:    Anginal Classification: CCS III  Anti-ischemic medical therapy: Minimal Therapy (1 class of medications)  Non-Invasive Test Results: No non-invasive testing performed  Prior CABG: No previous CABG        Lonni Cash

## 2024-10-04 NOTE — Discharge Instructions (Signed)

## 2024-10-05 ENCOUNTER — Telehealth: Payer: Self-pay | Admitting: Physician Assistant

## 2024-10-05 ENCOUNTER — Encounter (HOSPITAL_COMMUNITY): Payer: Self-pay | Admitting: Cardiovascular Disease

## 2024-10-05 NOTE — Telephone Encounter (Signed)
 Pt had a procedure done yesterday and is experiencing a sharp pain in the center. Pt would like a c/b from a nurse please advise

## 2024-10-05 NOTE — Telephone Encounter (Signed)
 Spoke with pt, she had a cath yesterday, she felt fine when she got up, but not long after moving around, she got a pressure in her chest. On a scale of 1-10 it is a 8. She reports it is constant and does not go away. She also is complaining of a sharpe pain in the chest that gets worse when she takes a deep breath. She has taken tums with no relief. Her cath was normal with only minimal cad. Advised patient it does not sound like her heart and to try tylenol  and ibuprofen.

## 2024-10-10 ENCOUNTER — Telehealth: Payer: Self-pay | Admitting: Cardiology

## 2024-10-10 MED ORDER — ISOSORBIDE MONONITRATE ER 30 MG PO TB24: 30.0000 mg | ORAL_TABLET | Freq: Every day | ORAL | 4 refills | Status: AC

## 2024-10-10 NOTE — Telephone Encounter (Signed)
 Pt still having a lot of pressure on my chest, sob and every so often I get a little dizzy.  Pressure rated 5/10. She is not scheduled to see provider for another 3 weeks, Dr. Sheena and has a follow up echocardiogram in December. She has not taken any nitroglycerin.  Advised pt to take 1 to see if improvement/relief. Called pt 30 minutes later, after taking the nitroglycerin, and she reports improvement with no further heavy feeling in the center of her chest.  (Not complete relief, but improvement.   Aware I will forward to last provider who saw pt for advisement/next steps.

## 2024-10-10 NOTE — Telephone Encounter (Signed)
 Since symptoms improved with NTG, question if she has vasospasm. Recent cath with mild nonobstructive CAD.  PLAN:  - Please start Imdur 30 mg once daily - Follow up as planned.  Glendia Ferrier, PA-C    10/10/2024 4:30 PM

## 2024-10-10 NOTE — Telephone Encounter (Signed)
 Pt c/o of Chest Pain: STAT if active (IN THIS MOMENT) CP, including tightness, pressure, jaw pain, shoulder/upper arm/back pain, SOB, nausea, and vomiting.  1. Are you having CP right now (tightness, pressure, or discomfort)? Pressure  2. Are you experiencing any other symptoms (ex. SOB, nausea, vomiting, sweating)? Some SOB  3. How long have you been experiencing CP? Before getting the cath  4. Is your CP continuous or coming and going? Continuous   5. Have you taken Nitroglycerin? no  6. If CP returns before callback, please consider calling 911. ?

## 2024-10-10 NOTE — Telephone Encounter (Signed)
 Called spoke to patient instruction given to patient  per [employer]  orders.   Imdur 30 mg e-sent to pharmacy - recommend taking medication  30 min or hour after taking daily dose Aspirin     Patient voiced understanding. Aware if chest discomfort is not relieved after using 3 doses of NTG with an episode of chest pain  go to ER via EMS.

## 2024-10-23 DIAGNOSIS — Z1231 Encounter for screening mammogram for malignant neoplasm of breast: Secondary | ICD-10-CM | POA: Diagnosis not present

## 2024-10-31 ENCOUNTER — Encounter: Payer: Self-pay | Admitting: *Deleted

## 2024-11-01 ENCOUNTER — Ambulatory Visit: Attending: Cardiology | Admitting: Cardiology

## 2024-11-01 ENCOUNTER — Encounter: Payer: Self-pay | Admitting: Cardiology

## 2024-11-01 VITALS — BP 120/72 | HR 75 | Ht 62.0 in | Wt 191.4 lb

## 2024-11-01 DIAGNOSIS — I10 Essential (primary) hypertension: Secondary | ICD-10-CM | POA: Diagnosis not present

## 2024-11-01 DIAGNOSIS — R0609 Other forms of dyspnea: Secondary | ICD-10-CM

## 2024-11-01 DIAGNOSIS — E782 Mixed hyperlipidemia: Secondary | ICD-10-CM | POA: Diagnosis not present

## 2024-11-01 DIAGNOSIS — I2510 Atherosclerotic heart disease of native coronary artery without angina pectoris: Secondary | ICD-10-CM | POA: Diagnosis not present

## 2024-11-01 NOTE — Patient Instructions (Signed)
 Medication Instructions:  Your physician recommends that you continue on your current medications as directed. Please refer to the Current Medication list given to you today.  *If you need a refill on your cardiac medications before your next appointment, please call your pharmacy*  Testing/Procedures: Your physician has recommended that you have a pulmonary function test. Pulmonary Function Tests are a group of tests that measure how well air moves in and out of your lungs.  Pulmonary Function Tests Pulmonary function tests (PFTs) are breathing tests that are used to: Measure how well your lungs work. Find out what is causing your lung problems. Find the best treatment for you. You may have PFTs: If you have a condition that affects your lungs, such as asthma or chronic obstructive pulmonary disease (COPD). To watch for changes in your lung function over time if you have a long-term (chronic) lung disease. If you are an IT trainer. PFTs check the effects of being exposed to chemicals over a long period of time. To check lung function: Before having surgery or other procedures. If you smoke. To check if prescribed medicines or treatments are helping your lungs. Tell a health care provider about: Any allergies you have. All medicines you are taking, including inhaler or nebulizer medicines, vitamins, herbs, eye drops, creams, and over-the-counter medicines. Any bleeding problems you have. Any surgeries you have had, especially recent surgery of the eye, abdomen, or chest. These can make PFTs difficult or unsafe. Any medical conditions you have, including chest pain or heart problems, tuberculosis, or respiratory infections such as pneumonia, a cold, or the flu. Any fear of being in closed spaces (claustrophobia). Some of your tests may be in a closed space. What are the risks? Your health care provider will talk with you about risks. These may include: Feeling light-headed  due to fast, deep breathing known as overbreathing or hyperventilation. An asthma attack from deep breathing. What happens before the test? Take over-the-counter and prescription medicines only as told by your health care provider. If you take inhaler or nebulizer medicines, ask your health care provider which medicines you should take on the day of your testing. Some inhaler medicines may interfere with PFTs if they are taken shortly before the tests. Follow instructions from your health care provider about what you may eat and drink. These may include: Avoiding eating large meals. Avoiding using caffeine before the testing. Not drinkingalcohol for up to 4 hours before the test. Do not use any products that contain nicotine or tobacco for up to 4 hours before your test. These products include cigarettes, chewing tobacco, and vaping devices, such as e-cigarettes. These can affect your test results. If you need help quitting, ask your health care provider. Wear comfortable clothing that will not get in the way of your breathing. Avoid exercise that takes a lot of effort (strenuous exercise) for at least 30 minutes before the test. What happens during the test?  You will be given: A soft noseclip to wear. This allows all of your breaths to go through your mouth instead of your nose. A germ-free (sterile) mouthpiece. It will be attached to a spirometer machine that measures your breathing. You will be asked to do breathing exercises. The exercises will be done by breathing in (inhaling) and breathing out (exhaling). You may have to repeat the exercises many times before the testing is complete. You will need to follow instructions exactly as told to get accurate results. Make sure to blow as hard and  as fast as you can when you are told to do so. You may be given a medicine called a bronchodilator. This makes the small air passages in your lungs larger so you can breathe easier. The tests will be  repeated after the medicine takes effect. You will be watched for any problems, such as feeling faint or dizzy, or having trouble breathing. The procedure may vary among health care providers and hospitals. What can I expect after the test? Your results will be compared with the expected lung function of someone with healthy lungs who is similar to you in several ways. These ways include age, sex, height, weight, and race or ethnicity. This is done to show how your lung function compares with normal lung function (percent predicted). The percent predicted helps your health care provider know if your lung function is normal or not. If you have had PFTs done before, your health care provider will compare your current results with past results. This shows if your lung function is better, worse, or the same as before. It is up to you to get the results of your procedure. Ask your health care provider, or the department that is doing the procedure, when your results will be ready. After you get your results, talk with your health care provider about treatment options, if necessary. This information is not intended to replace advice given to you by your health care provider. Make sure you discuss any questions you have with your health care provider. Document Revised: 06/22/2022 Document Reviewed: 06/22/2022 Elsevier Patient Education  2024 Elsevier Inc.  Follow-Up: At [facility], you and your health needs are our priority.  As part of our continuing mission to provide you with exceptional heart care, our providers are all part of one team.  This team includes your primary Cardiologist (physician) and Advanced Practice Providers or APPs (Physician Assistants and Nurse Practitioners) who all work together to provide you with the care you need, when you need it.  Your next appointment:   6 month(s)  Provider:   Kardie Tobb, DO

## 2024-11-01 NOTE — Progress Notes (Signed)
 Cardiology Office Note:    Date:  11/04/2024   ID:  Alexis Frye, DOB 1951/09/18, MRN [id number]  PCP:  Okey Carlin Redbird, MD  Cardiologist:  Dub Huntsman, DO  Electrophysiologist:  None   Referring MD: Okey Carlin Redbird, MD    I am ok  History of Present Illness:    Alexis Frye is a 73 y.o. female with a hx of mild nonobstructive CAD, GERD, hyperlipidemia, hypertension, IBS, obesity here today for follow-up visit.  This is my first visit with the patient.  She was seen in our heart first clinic on October 03, 2024.  At that time she presented with chest discomfort as well as accompanying shortness of breath.  This was concerning therefore she was arranged for a cardiac catheterization and echocardiogram.  She did get the echocardiogram scheduled for December 5, but her heart catheterization was October 22.  During that cath it was noted that she had mild nonobstructive disease.  She is here today for follow-up visit.  She tells me that she still is experiencing shortness of breath it really has not changed despite the Imdur .  She also explains to me that she has been experiencing shortness of breath going up and down the stairs.  Recently on a narrow plane when she was crossing the road she has felt significantly short of breath.  She felt that if she had an inhaler she would have felt a lot better.  No other complaints at this time.  No palpitations no lightheadedness no dizziness.  Past Medical History:  Diagnosis Date   Acute stress disorder    Arm pain left   Arthritis    Atherosclerosis of aorta    Cerebrovascular disease    Diverticulitis    Dysphagia    GERD (gastroesophageal reflux disease)    Headache    HLD (hyperlipidemia)    Hoarseness    Hypertension    IBS (irritable bowel syndrome)    Migraine    Obesity    Occipital neuralgia    Onychomycosis    Osteopenia    PONV (postoperative nausea and vomiting)    Sciatica    Seasonal allergies    Stroke  (HCC)    2 TIAS 2012 and 2021   TIA (transient ischemic attack) 12/14/2010   no residual   TIA (transient ischemic attack) 2021   Urinary incontinence    Vaginal dryness    Vitamin D deficiency    Wears glasses     Past Surgical History:  Procedure Laterality Date   ABDOMINAL HYSTERECTOMY     CARDIAC CATHETERIZATION N/A 04/01/2016   Procedure: Left Heart Cath and Coronary Angiography;  Surgeon: Peter M Jordan, MD;  Location: [facility];  Service: Cardiovascular;  Laterality: N/A;   CATARACT EXTRACTION Bilateral 2020   CESAREAN SECTION     x2   CHOLECYSTECTOMY     COLONOSCOPY     CYST EXCISION Right 09/16/2021   Procedure: Excision of right long finger DIP ganglion cyst;  Surgeon: Elisabeth Craig RAMAN, MD;  Location: [facility];  Service: Plastics;  Laterality: Right;   DIAGNOSTIC LAPAROSCOPY     DILATION AND CURETTAGE OF UTERUS     LARYNGOSCOPY     vocal cord polyps   LEFT HEART CATH AND CORONARY ANGIOGRAPHY N/A 10/04/2024   Procedure: LEFT HEART CATH AND CORONARY ANGIOGRAPHY;  Surgeon: Verlin Lonni BIRCH, MD;  Location: [facility];  Service: Cardiovascular;  Laterality: N/A;   PANNICULECTOMY N/A  06/09/2021   Procedure: Infraumbilical panniculectomy;  Surgeon: Elisabeth Craig RAMAN, MD;  Location: [facility];  Service: Plastics;  Laterality: N/A;  90 min   SHOULDER ARTHROSCOPY WITH ROTATOR CUFF REPAIR Left 11/26/2014   Procedure: LEFT SHOULDER ARTHROSCOPY W/ SAD/DCR (no cuff repair);  Surgeon: Dempsey JINNY Sensor, MD;  Location: [facility];  Service: Orthopedics;  Laterality: Left;   tens implant     TONSILLECTOMY      Current Medications: Current Meds  Medication Sig   amLODipine-benazepril (LOTREL) 5-20 MG capsule Take 1 capsule by mouth every other day.   aspirin  EC 81 MG tablet Take 81-162 mg by mouth See admin instructions. Take 81 mg in the morning and 162 mg at bedtime   atorvastatin  (LIPITOR) 20 MG tablet Take 20 mg by mouth daily.    CALCIUM  GLUCONATE PO Take 600 mg by mouth daily.   Cholecalciferol (VITAMIN D) 125 MCG (5000 UT) CAPS Take 15,000 Units by mouth daily.   Cyanocobalamin (VITAMIN B 12) 500 MCG TABS Take 5 tablets by mouth daily.   gabapentin (NEURONTIN) 300 MG capsule Take 300-600 mg by mouth See admin instructions. Take 300 mg in the morning and 600 mg at bedtime   Homeopathic Products [facility] ALLERGY EYE RELIEF) SOLN Place 1 drop into both eyes daily.   isosorbide  mononitrate (IMDUR ) 30 MG 24 hr tablet Take 1 tablet (30 mg total) by mouth daily.   loratadine  (CLARITIN ) 10 MG tablet Take 10 mg by mouth daily.   Magnesium 250 MG TABS Take 1 tablet by mouth daily.   metoprolol  succinate (TOPROL -XL) 25 MG 24 hr tablet Take 1 tablet (25 mg total) by mouth daily.   nitroGLYCERIN  (NITROSTAT ) 0.4 MG SL tablet Place 1 tablet (0.4 mg total) under the tongue every 5 (five) minutes as needed for chest pain.   Omega-3 Fatty Acids (FISH OIL) 1000 MG CAPS Take 1 capsule by mouth daily.   omeprazole (PRILOSEC) 20 MG capsule Take 20 mg by mouth every other day.   terbinafine (LAMISIL) 250 MG tablet Take 250 mg by mouth daily.   Turmeric 500 MG CAPS Take 1 tablet by mouth daily after breakfast.     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   Right-handed.   2 cups caffeine per day.   Social Drivers of Corporate Investment Banker Strain: Not on file  Food Insecurity: Not on file  Transportation Needs: Not on file  Physical Activity: Not on file  Stress: Not on file  Social Connections: Not on file     Family  History: The patient's family history includes Alcoholism in her brother and father; CAD in her brother and brother; Emphysema in her mother; Heart attack (age of onset: 72) in her sister; Heart attack (age of onset: 1) in her sister; Heart disease in her mother; Osteoporosis in her mother; Stroke in her mother.  ROS:   Review of Systems  Constitution: Negative for decreased appetite, fever and weight gain.  HENT: Negative for congestion, ear discharge, hoarse voice and sore throat.   Eyes: Negative for discharge, redness, vision loss in right eye and visual halos.  Cardiovascular: Negative for chest pain, dyspnea on exertion, leg swelling, orthopnea and palpitations.  Respiratory: Negative for cough, hemoptysis, shortness of breath and snoring.   Endocrine: Negative for heat intolerance and polyphagia.  Hematologic/Lymphatic: Negative for bleeding problem. Does not bruise/bleed easily.  Skin: Negative for flushing, nail changes, rash and suspicious lesions.  Musculoskeletal: Negative for arthritis, joint pain, muscle cramps, myalgias, neck pain and stiffness.  Gastrointestinal: Negative for abdominal pain, bowel incontinence, diarrhea and excessive appetite.  Genitourinary: Negative for decreased libido, genital sores and incomplete emptying.  Neurological: Negative for brief paralysis, focal weakness, headaches and loss of balance.  Psychiatric/Behavioral: Negative for altered mental status, depression and suicidal ideas.  Allergic/Immunologic: Negative for HIV exposure and persistent infections.    EKGs/Labs/Other Studies Reviewed:     The following studies were reviewed today:   EKG:  The ekg ordered today demonstrates   Recent Labs: 10/03/2024: BUN 21; Creatinine, Ser 0.79; Hemoglobin 14.5; Platelets 209; Potassium 4.1; Sodium 141  Recent Lipid Panel    Component Value Date/Time   CHOL 163 08/16/2020 0255   TRIG 217 (H) 08/16/2020 0255   HDL 41 08/16/2020 0255   CHOLHDL 4.0 08/16/2020 0255   VLDL 43 (H) 08/16/2020 0255   LDLCALC 79 08/16/2020 0255    Physical Exam:    VS:  BP 120/72 (BP Location: Left Arm, Patient Position: Sitting, Cuff Size: Normal)   Pulse 75   Ht 5' 2 (1.575 m)   Wt 191 lb 6.4 oz (86.8 kg)   SpO2 96%   BMI 35.01 kg/m     Wt Readings from Last 3 Encounters:  11/01/24 191 lb 6.4 oz (86.8 kg)  10/04/24 190 lb (86.2 kg)  10/03/24 189 lb 6.4 oz (85.9 kg)     GEN: Well nourished, well developed in no acute distress HEENT: Normal NECK: No JVD; No carotid bruits LYMPHATICS: No lymphadenopathy CARDIAC: S1S2 noted,RRR, no murmurs, rubs, gallops RESPIRATORY:  Clear to auscultation without rales, wheezing or rhonchi  ABDOMEN: Soft, non-tender, non-distended, +bowel sounds, no guarding. EXTREMITIES: No edema, No cyanosis, no clubbing MUSCULOSKELETAL:  No deformity  SKIN: Warm and dry NEUROLOGIC:  Alert and oriented x 3, non-focal PSYCHIATRIC:  Normal affect, good insight  ASSESSMENT:    1. DOE (dyspnea on exertion)   2. Essential hypertension   3. Mixed hyperlipidemia   4. Mild CAD    PLAN:     Dyspnea on exertion-she still does have associated chest pressure with this.  Thankfully her heart catheterization did not show any obstructive disease.  At this point what I like to do is send the patient for pulmonary function testing to make sure we rule out any obstructive or restrictive lung disease. Her echocardiogram is pending  If all of her testing proved normal.  She may benefit from a stress echocardiogram or even a sleep study as well.  Blood pressure is acceptable,  continue with current antihypertensive regimen.  The  patient understands the need to lose weight with diet and exercise. We have discussed specific strategies for this.  The patient is in agreement with the above plan. The patient left the office in stable condition.  The patient will follow up in 6 months.    Medication Adjustments/Labs and Tests Ordered: Current medicines are reviewed at length with the patient today.  Concerns regarding medicines are outlined above.  Orders Placed This Encounter  Procedures   Pulmonary function test   No orders of the defined types were placed in this encounter.   Patient Instructions  Medication Instructions:  Your physician recommends that you continue on your current medications as directed. Please refer to the Current Medication list given to you today.  *If you need a refill on your cardiac medications before your next appointment, please call your pharmacy*   Testing/Procedures: Your physician has recommended that you have a pulmonary function test. Pulmonary Function Tests are a group of tests that measure how well air moves in and out of your lungs.  Pulmonary Function Tests Pulmonary function tests (PFTs) are breathing tests that are used to: Measure how well your lungs work. Find out what is causing your lung problems. Find the best treatment for you. You may have PFTs: If you have a condition that affects your lungs, such as asthma or chronic obstructive pulmonary disease (COPD). To watch for changes in your lung function over time if you have a long-term (chronic) lung disease. If you are an it trainer. PFTs check the effects of being exposed to chemicals over a long period of time. To check lung function: Before having surgery or other procedures. If you smoke. To check if prescribed medicines or treatments are helping your lungs. Tell a health care provider about: Any allergies you have. All medicines you are  taking, including inhaler or nebulizer medicines, vitamins, herbs, eye drops, creams, and over-the-counter medicines. Any bleeding problems you have. Any surgeries you have had, especially recent surgery of the eye, abdomen, or chest. These can make PFTs difficult or unsafe. Any medical conditions you have, including chest pain or heart problems, tuberculosis, or respiratory infections such as pneumonia, a cold, or the flu. Any fear of being in closed spaces (claustrophobia). Some of your tests may be in a closed space. What are the risks? Your health care provider will talk with you about risks. These may include: Feeling light-headed due to fast, deep breathing known as overbreathing or hyperventilation. An asthma attack from deep breathing. What happens before the test? Take over-the-counter and prescription medicines only as told by your health care provider. If you take inhaler or nebulizer medicines, ask your health care provider which medicines you should take on the day of your testing. Some inhaler medicines may interfere with PFTs if they are taken shortly before the tests. Follow instructions from your health care provider about what you may eat and drink. These may include: Avoiding eating large meals. Avoiding using caffeine before the testing. Not drinkingalcohol for up to 4 hours before the test. Do not use any products that contain nicotine or tobacco for up to 4 hours before your test. These products include cigarettes, chewing tobacco, and vaping devices, such as e-cigarettes. These can affect your test results. If you need help quitting, ask your health care provider. Wear comfortable clothing that will not get in the way of your breathing. Avoid exercise that takes a lot of effort (strenuous exercise) for at least 30 minutes before the test.  What happens during the test?  You will be given: A soft noseclip to wear. This allows all of your breaths to go through your mouth  instead of your nose. A germ-free (sterile) mouthpiece. It will be attached to a spirometer machine that measures your breathing. You will be asked to do breathing exercises. The exercises will be done by breathing in (inhaling) and breathing out (exhaling). You may have to repeat the exercises many times before the testing is complete. You will need to follow instructions exactly as told to get accurate results. Make sure to blow as hard and as fast as you can when you are told to do so. You may be given a medicine called a bronchodilator. This makes the small air passages in your lungs larger so you can breathe easier. The tests will be repeated after the medicine takes effect. You will be watched for any problems, such as feeling faint or dizzy, or having trouble breathing. The procedure may vary among health care providers and hospitals. What can I expect after the test? Your results will be compared with the expected lung function of someone with healthy lungs who is similar to you in several ways. These ways include age, sex, height, weight, and race or ethnicity. This is done to show how your lung function compares with normal lung function (percent predicted). The percent predicted helps your health care provider know if your lung function is normal or not. If you have had PFTs done before, your health care provider will compare your current results with past results. This shows if your lung function is better, worse, or the same as before. It is up to you to get the results of your procedure. Ask your health care provider, or the department that is doing the procedure, when your results will be ready. After you get your results, talk with your health care provider about treatment options, if necessary. This information is not intended to replace advice given to you by your health care provider. Make sure you discuss any questions you have with your health care provider. Document Revised:  06/22/2022 Document Reviewed: 06/22/2022 Elsevier Patient Education  2024 Elsevier Inc.  Follow-Up: At [facility], you and your health needs are our priority.  As part of our continuing mission to provide you with exceptional heart care, our providers are all part of one team.  This team includes your primary Cardiologist (physician) and Advanced Practice Providers or APPs (Physician Assistants and Nurse Practitioners) who all work together to provide you with the care you need, when you need it.  Your next appointment:   6 month(s)  Provider:   Sharie Amorin, DO     Adopting a Healthy Lifestyle.  Know what a healthy weight is for you (roughly BMI <25) and aim to maintain this   Aim for 7+ servings of fruits and vegetables daily   65-80+ fluid ounces of water  or unsweet tea for healthy kidneys   Limit to max 1 drink of alcohol per day; avoid smoking/tobacco   Limit animal fats in diet for cholesterol and heart health - choose grass fed whenever available   Avoid highly processed foods, and foods high in saturated/trans fats   Aim for low stress - take time to unwind and care for your mental health   Aim for 150 min of moderate intensity exercise weekly for heart health, and weights twice weekly for bone health   Aim for 7-9 hours of sleep daily   When  it comes to diets, agreement about the perfect plan isnt easy to find, even among the experts. Experts at the [facility] [employer] developed an idea known as the Healthy Eating Plate. Just imagine a plate divided into logical, healthy portions.   The emphasis is on diet quality:   Load up on vegetables and fruits - one-half of your plate: Aim for color and variety, and remember that potatoes dont count.   Go for whole grains - one-quarter of your plate: Whole wheat, barley, wheat berries, quinoa, oats, brown rice, and foods made with them. If you want pasta, go with whole wheat pasta.   Protein power -  one-quarter of your plate: Fish, chicken, beans, and nuts are all healthy, versatile protein sources. Limit red meat.   The diet, however, does go beyond the plate, offering a few other suggestions.   Use healthy plant oils, such as olive, canola, soy, corn, sunflower and peanut. Check the labels, and avoid partially hydrogenated oil, which have unhealthy trans fats.   If youre thirsty, drink water . Coffee and tea are good in moderation, but skip sugary drinks and limit milk and dairy products to one or two daily servings.   The type of carbohydrate in the diet is more important than the amount. Some sources of carbohydrates, such as vegetables, fruits, whole grains, and beans-are healthier than others.   Finally, stay active  Signed, Dub Huntsman, DO  11/04/2024 9:03 AM    [facility]

## 2024-11-17 ENCOUNTER — Ambulatory Visit (HOSPITAL_COMMUNITY)
Admission: RE | Admit: 2024-11-17 | Discharge: 2024-11-17 | Disposition: A | Source: Ambulatory Visit | Attending: Cardiology

## 2024-11-17 DIAGNOSIS — I10 Essential (primary) hypertension: Secondary | ICD-10-CM | POA: Diagnosis not present

## 2024-11-17 DIAGNOSIS — R0602 Shortness of breath: Secondary | ICD-10-CM

## 2024-11-17 DIAGNOSIS — R079 Chest pain, unspecified: Secondary | ICD-10-CM | POA: Diagnosis not present

## 2024-11-17 DIAGNOSIS — R0609 Other forms of dyspnea: Secondary | ICD-10-CM | POA: Diagnosis not present

## 2024-11-17 DIAGNOSIS — I2510 Atherosclerotic heart disease of native coronary artery without angina pectoris: Secondary | ICD-10-CM | POA: Diagnosis not present

## 2024-11-17 DIAGNOSIS — R072 Precordial pain: Secondary | ICD-10-CM

## 2024-11-17 LAB — PULMONARY FUNCTION TEST
DL/VA % pred: 82 %
DL/VA: 3.44 ml/min/mmHg/L
DLCO unc % pred: 71 %
DLCO unc: 12.89 ml/min/mmHg
FEF 25-75 Post: 3.45 L/s
FEF 25-75 Pre: 2.65 L/s
FEF2575-%Change-Post: 30 %
FEF2575-%Pred-Post: 198 %
FEF2575-%Pred-Pre: 152 %
FEV1-%Change-Post: 5 %
FEV1-%Pred-Post: 114 %
FEV1-%Pred-Pre: 108 %
FEV1-Post: 2.34 L
FEV1-Pre: 2.22 L
FEV1FVC-%Change-Post: 2 %
FEV1FVC-%Pred-Pre: 111 %
FEV6-%Change-Post: 3 %
FEV6-%Pred-Post: 104 %
FEV6-%Pred-Pre: 101 %
FEV6-Post: 2.71 L
FEV6-Pre: 2.63 L
FEV6FVC-%Pred-Post: 104 %
FEV6FVC-%Pred-Pre: 104 %
FVC-%Change-Post: 3 %
FVC-%Pred-Post: 99 %
FVC-%Pred-Pre: 96 %
FVC-Post: 2.71 L
FVC-Pre: 2.63 L
Post FEV1/FVC ratio: 86 %
Post FEV6/FVC ratio: 100 %
Pre FEV1/FVC ratio: 84 %
Pre FEV6/FVC Ratio: 100 %
RV % pred: 86 %
RV: 1.82 L
TLC % pred: 98 %
TLC: 4.68 L

## 2024-11-17 LAB — ECHOCARDIOGRAM COMPLETE
Area-P 1/2: 3.01 cm2
S' Lateral: 2.6 cm

## 2024-11-17 MED ORDER — ALBUTEROL SULFATE (2.5 MG/3ML) 0.083% IN NEBU
2.5000 mg | INHALATION_SOLUTION | Freq: Once | RESPIRATORY_TRACT | Status: AC
  Administered 2024-11-17: 2.5 mg via RESPIRATORY_TRACT

## 2024-11-20 ENCOUNTER — Encounter: Payer: Self-pay | Admitting: Physician Assistant

## 2024-11-20 DIAGNOSIS — I2510 Atherosclerotic heart disease of native coronary artery without angina pectoris: Secondary | ICD-10-CM | POA: Insufficient documentation

## 2024-12-04 ENCOUNTER — Encounter: Payer: Self-pay | Admitting: Cardiology

## 2024-12-07 ENCOUNTER — Ambulatory Visit: Payer: Self-pay | Admitting: Cardiology

## 2024-12-07 DIAGNOSIS — R942 Abnormal results of pulmonary function studies: Secondary | ICD-10-CM

## 2024-12-20 ENCOUNTER — Encounter: Payer: Self-pay | Admitting: Cardiology

## 2024-12-21 NOTE — Telephone Encounter (Signed)
 Pulmonary referral placed.

## 2025-01-04 ENCOUNTER — Encounter: Payer: Self-pay | Admitting: Pulmonary Disease

## 2025-01-04 ENCOUNTER — Ambulatory Visit: Admitting: Pulmonary Disease

## 2025-01-04 VITALS — BP 119/76 | HR 65 | Temp 97.7°F | Ht 63.0 in | Wt 190.2 lb

## 2025-01-04 DIAGNOSIS — R0789 Other chest pain: Secondary | ICD-10-CM | POA: Diagnosis not present

## 2025-01-04 DIAGNOSIS — R0609 Other forms of dyspnea: Secondary | ICD-10-CM | POA: Diagnosis not present

## 2025-01-04 DIAGNOSIS — R942 Abnormal results of pulmonary function studies: Secondary | ICD-10-CM | POA: Diagnosis not present

## 2025-01-04 MED ORDER — FLUTICASONE-SALMETEROL 500-50 MCG/ACT IN AEPB: 1.0000 | INHALATION_SPRAY | Freq: Two times a day (BID) | RESPIRATORY_TRACT | 12 refills | Status: AC

## 2025-01-04 NOTE — Patient Instructions (Addendum)
 Nice to meet you  Use Advair discus 1 puff twice a day a day, rinse your mouth out with water  after every use  For a refresher on how to use the inhaler, good YouTube and search [employer] how to use a Diskus  I ordered a CT scan to evaluate for any change in the lungs that could account for the mild reduction in DLCO, the only abnormality on your pulmonary function test.  Please send me a message or call if the inhaler is too expensive, I will look for more cost-effective alternative.  If the inhaler is helping some but not totally, send me a message in the interim and we can try a different inhaler.  Return to clinic in 3 months or sooner as needed with Dr. Annella

## 2025-01-04 NOTE — Progress Notes (Signed)
 "  @Patient  ID: Alexis Frye, female    DOB: January 31, 1951, 74 y.o.   MRN: [id number]  Chief Complaint  Patient presents with   Consult    Pt states she has been been having pressure, tightness in the middle of chest SOB occurs w/ walking Dry cough occurs Every so often pt stated she will get catch in breathe where she has to stop and gasp for breath    Referring provider: Sheena Pugh, DO  HPI:   74 y.o. woman whom were seen for dyspnea on exertion as well as occasional chest discomfort.  Multiple prior cardiology notes reviewed reviewed.  She reports shortness of breath for the last 6 months or so.  No time of day when things are better or worse.  No position makes it better or worse.  No seasonal or environmental factors she can identify that make things better or worse.  Just occurs with exertion.  Relieved with rest.  No other alleviating or exacerbating factors.  She denies a history of asthma.  She is a never smoker.  Workup for her symptoms have included a left heart cath which showed nonobstructive coronary artery disease and LVEDP of 8 09/2024.  Subsequent echo 11/2024 revealed trivial MVR and trivial AI, otherwise reassuring echo in my review and interpretation.  She had pulmonary function test 11/2024 that revealed normal spirometry, no bronchodilator response, lung volumes within normal limits, DLCO mildly reduced but suspect underestimated.  Along the same timeframe she has had occasional chest discomfort.  Tightness.  Difficulty getting a deep breath.  Sometimes with spontaneous deep breaths this occurs.  She was exposed to secondhand smoke.  Multiple members of her family have emphysema.  Sister is status post lobectomy for lung cancer.  But again, patient has never smoked.  Questionaires / Pulmonary Flowsheets:   ACT:      No data to display          MMRC:     No data to display          Epworth:      No data to display          Tests:   FENO:  No  results found for: NITRICOXIDE  PFT:    Latest Ref Rng & Units 11/17/2024   10:08 AM  PFT Results  FVC-Pre L 2.63   FVC-Predicted Pre % 96   FVC-Post L 2.71   FVC-Predicted Post % 99   Pre FEV1/FVC % % 84   Post FEV1/FCV % % 86   FEV1-Pre L 2.22   FEV1-Predicted Pre % 108   FEV1-Post L 2.34   DLCO uncorrected ml/min/mmHg 12.89   DLCO UNC% % 71   DLVA Predicted % 82   TLC L 4.68   TLC % Predicted % 98   RV % Predicted % 86   Spirometry normal, no bronchodilator response, lung volumes within the limits, DLCO mildly reduced but suspect underestimated given volume used to calculate is significant lower than TLC measured and lung volumes  WALK:      No data to display          Imaging: Personally reviewed and as per EMR and discussion in this note No results found.  Lab Results: Personally reviewed CBC    Component Value Date/Time   WBC 9.2 10/03/2024 0000   WBC 5.9 06/04/2021 0900   RBC 4.71 10/03/2024 0000   RBC 4.60 06/04/2021 0900   HGB 14.5 10/03/2024 0000   HCT 43.9  10/03/2024 0000   PLT 209 10/03/2024 0000   MCV 93 10/03/2024 0000   MCH 30.8 10/03/2024 0000   MCH 30.2 06/04/2021 0900   MCHC 33.0 10/03/2024 0000   MCHC 32.9 06/04/2021 0900   RDW 14.0 10/03/2024 0000   LYMPHSABS 3.0 08/15/2020 2126   MONOABS 0.5 08/15/2020 2126   EOSABS 0.1 08/15/2020 2126   BASOSABS 0.0 08/15/2020 2126    BMET    Component Value Date/Time   NA 141 10/03/2024 0000   K 4.1 10/03/2024 0000   CL 106 10/03/2024 0000   CO2 27 10/03/2024 0000   GLUCOSE 93 10/03/2024 0000   GLUCOSE 97 06/04/2021 0900   BUN 21 10/03/2024 0000   CREATININE 0.79 10/03/2024 0000   CALCIUM  9.8 10/03/2024 0000   GFRNONAA >60 06/04/2021 0900   GFRAA >60 08/16/2020 0255    BNP No results found for: BNP  ProBNP No results found for: PROBNP  Specialty Problems       Pulmonary Problems   Sleep apnea    Allergies[1]  Immunization History  Administered Date(s) Administered    Fluad Quad(high Dose 65+) 11/09/2024   PFIZER(Purple Top)SARS-COV-2 Vaccination 01/20/2020, 02/14/2020    Past Medical History:  Diagnosis Date   Acute stress disorder    Arm pain left   Arthritis    Atherosclerosis of aorta    CAD (coronary artery disease) 11/20/2024   LHC10/22/25: mild nonobstructive CAD  TTE 11/17/2024: EF 60-65, no RWMA, normal RVSF, trivial MR, trivial AI    Cerebrovascular disease    Diverticulitis    Dysphagia    GERD (gastroesophageal reflux disease)    Headache    HLD (hyperlipidemia)    Hoarseness    Hypertension    IBS (irritable bowel syndrome)    Migraine    Obesity    Occipital neuralgia    Onychomycosis    Osteopenia    PONV (postoperative nausea and vomiting)    Sciatica    Seasonal allergies    Stroke (HCC)    2 TIAS 2012 and 2021   TIA (transient ischemic attack) 12/14/2010   no residual   TIA (transient ischemic attack) 2021   Urinary incontinence    Vaginal dryness    Vitamin D deficiency    Wears glasses     Tobacco History: Tobacco Use History[2] Counseling given: Not Answered   Continue to not smoke  Outpatient Encounter Medications as of 01/04/2025  Medication Sig   amLODipine-benazepril (LOTREL) 5-20 MG capsule Take 1 capsule by mouth every other day.   aspirin  EC 81 MG tablet Take 81-162 mg by mouth See admin instructions. Take 81 mg in the morning and 162 mg at bedtime   atorvastatin  (LIPITOR) 20 MG tablet Take 20 mg by mouth daily.   CALCIUM  GLUCONATE PO Take 600 mg by mouth daily.   Cholecalciferol (VITAMIN D) 125 MCG (5000 UT) CAPS Take 15,000 Units by mouth daily.   Cyanocobalamin (VITAMIN B 12) 500 MCG TABS Take 5 tablets by mouth daily.   fluticasone -salmeterol (ADVAIR) 500-50 MCG/ACT AEPB Inhale 1 puff into the lungs in the morning and at bedtime.   gabapentin (NEURONTIN) 300 MG capsule Take 300-600 mg by mouth See admin instructions. Take 300 mg in the morning and 600 mg at bedtime   Homeopathic Products  [facility] ALLERGY EYE RELIEF) SOLN Place 1 drop into both eyes daily.   loratadine  (CLARITIN ) 10 MG tablet Take 10 mg by mouth daily. (Patient taking differently: Take 10 mg by mouth daily. As  needed)   Magnesium 250 MG TABS Take 1 tablet by mouth daily.   metoprolol  succinate (TOPROL -XL) 25 MG 24 hr tablet Take 1 tablet (25 mg total) by mouth daily.   nitroGLYCERIN  (NITROSTAT ) 0.4 MG SL tablet Place 1 tablet (0.4 mg total) under the tongue every 5 (five) minutes as needed for chest pain.   Omega-3 Fatty Acids (FISH OIL) 1000 MG CAPS Take 1 capsule by mouth daily.   omeprazole (PRILOSEC) 20 MG capsule Take 20 mg by mouth every other day.   terbinafine (LAMISIL) 250 MG tablet Take 250 mg by mouth daily.   Turmeric 500 MG CAPS Take 1 tablet by mouth daily after breakfast.   isosorbide  mononitrate (IMDUR ) 30 MG 24 hr tablet Take 1 tablet (30 mg total) by mouth daily. (Patient not taking: Reported on 01/04/2025)   No facility-administered encounter medications on file as of 01/04/2025.     Review of Systems  Review of Systems  Comprehensive review of systems negative unless mentioned in HPI above. Physical Exam  BP 119/76   Pulse 65   Temp 97.7 F (36.5 C) (Oral)   Ht 5' 3 (1.6 m) Comment: per patient  Wt 190 lb 3.2 oz (86.3 kg)   SpO2 96% Comment: on RA  BMI 33.69 kg/m   Wt Readings from Last 5 Encounters:  01/04/25 190 lb 3.2 oz (86.3 kg)  11/01/24 191 lb 6.4 oz (86.8 kg)  10/04/24 190 lb (86.2 kg)  10/03/24 189 lb 6.4 oz (85.9 kg)  05/03/23 180 lb 8 oz (81.9 kg)    BMI Readings from Last 5 Encounters:  01/04/25 33.69 kg/m  11/01/24 35.01 kg/m  10/04/24 33.66 kg/m  10/03/24 33.55 kg/m  05/03/23 31.97 kg/m     Physical Exam General: In chair, no distress Eyes: EOMI Neck: No JVP Pulmonary: Clear, good air excursion, no work of breathing, no crackles or wheeze Cardiovascular: Regular rate and rhythm, no murmur appreciated Abdomen: Nondistended   Assessment &  Plan:   Dyspnea on exertion: Suspect multifactorial related to cardiac etiologies with mitral valve regurgitation, aortic insufficiency.  PFTs 10/2024 with normal spirometry and lung volumes.  Very reassuring.  Low suspicion for parenchymal involvement.  DLCO is mildly reduced but I think this is underestimated, see discussion below.  Asthma is possible especially with some chest discomfort.  Trial of Advair discus high-dose.  Assess response.  Chest tightness, discomfort: Possible asthma.  ICS/LABA as above.    Reduced DLCO: Mildly reduced and likely underestimated given volume used to significant less than TLC and lung volumes.  High-resolution CT chest ordered to evaluate possibility of early or occult ILD.   Return in about 3 months (around 04/04/2025) for f/u Dr. Annella.   Alexis JONELLE Annella, MD 01/04/2025   This appointment required 60 minutes of patient care (this includes precharting, chart review, review of results, face-to-face care, etc.).     [1]  Allergies Allergen Reactions   Codeine Other (See Comments)    unk reaction.  Other Reaction(s): loopy feeling  [2]  Social History Tobacco Use  Smoking Status Never  Smokeless Tobacco Never   "

## 2025-01-12 ENCOUNTER — Ambulatory Visit
Admission: RE | Admit: 2025-01-12 | Discharge: 2025-01-12 | Disposition: A | Source: Ambulatory Visit | Attending: Pulmonary Disease | Admitting: Pulmonary Disease

## 2025-01-12 DIAGNOSIS — R942 Abnormal results of pulmonary function studies: Secondary | ICD-10-CM

## 2025-04-09 ENCOUNTER — Ambulatory Visit: Admitting: Pulmonary Disease
